# Patient Record
Sex: Female | Born: 1950 | Race: White | Hispanic: No | Marital: Married | Smoking: Former smoker
Health system: Southern US, Community
[De-identification: ages and names within clinical notes are randomized; demographics above are authoritative.]

## PROBLEM LIST (undated history)

## (undated) DIAGNOSIS — Z8673 Personal history of transient ischemic attack (TIA), and cerebral infarction without residual deficits: Secondary | ICD-10-CM

## (undated) DIAGNOSIS — F419 Anxiety disorder, unspecified: Secondary | ICD-10-CM

## (undated) DIAGNOSIS — E785 Hyperlipidemia, unspecified: Secondary | ICD-10-CM

## (undated) DIAGNOSIS — M810 Age-related osteoporosis without current pathological fracture: Secondary | ICD-10-CM

## (undated) DIAGNOSIS — N201 Calculus of ureter: Secondary | ICD-10-CM

## (undated) DIAGNOSIS — F329 Major depressive disorder, single episode, unspecified: Secondary | ICD-10-CM

## (undated) DIAGNOSIS — F0150 Vascular dementia without behavioral disturbance: Secondary | ICD-10-CM

## (undated) DIAGNOSIS — G43909 Migraine, unspecified, not intractable, without status migrainosus: Secondary | ICD-10-CM

## (undated) DIAGNOSIS — N200 Calculus of kidney: Secondary | ICD-10-CM

## (undated) DIAGNOSIS — Z8711 Personal history of peptic ulcer disease: Secondary | ICD-10-CM

## (undated) DIAGNOSIS — M1990 Unspecified osteoarthritis, unspecified site: Secondary | ICD-10-CM

## (undated) DIAGNOSIS — E119 Type 2 diabetes mellitus without complications: Secondary | ICD-10-CM

## (undated) DIAGNOSIS — N189 Chronic kidney disease, unspecified: Secondary | ICD-10-CM

## (undated) DIAGNOSIS — I10 Essential (primary) hypertension: Secondary | ICD-10-CM

## (undated) DIAGNOSIS — F32A Depression, unspecified: Secondary | ICD-10-CM

## (undated) DIAGNOSIS — Z8719 Personal history of other diseases of the digestive system: Secondary | ICD-10-CM

## (undated) DIAGNOSIS — Z86718 Personal history of other venous thrombosis and embolism: Secondary | ICD-10-CM

## (undated) DIAGNOSIS — Z87442 Personal history of urinary calculi: Secondary | ICD-10-CM

## (undated) DIAGNOSIS — K219 Gastro-esophageal reflux disease without esophagitis: Secondary | ICD-10-CM

## (undated) HISTORY — DX: Chronic kidney disease, unspecified: N18.9

## (undated) HISTORY — DX: Depression, unspecified: F32.A

## (undated) HISTORY — DX: Personal history of transient ischemic attack (TIA), and cerebral infarction without residual deficits: Z86.73

## (undated) HISTORY — DX: Major depressive disorder, single episode, unspecified: F32.9

## (undated) HISTORY — DX: Unspecified osteoarthritis, unspecified site: M19.90

## (undated) HISTORY — DX: Vascular dementia, unspecified severity, without behavioral disturbance, psychotic disturbance, mood disturbance, and anxiety: F01.50

## (undated) HISTORY — DX: Personal history of urinary calculi: Z87.442

## (undated) HISTORY — DX: Age-related osteoporosis without current pathological fracture: M81.0

## (undated) HISTORY — DX: Personal history of other venous thrombosis and embolism: Z86.718

## (undated) HISTORY — DX: Essential (primary) hypertension: I10

## (undated) HISTORY — PX: WISDOM TOOTH EXTRACTION: SHX21

## (undated) HISTORY — DX: Migraine, unspecified, not intractable, without status migrainosus: G43.909

## (undated) HISTORY — DX: Anxiety disorder, unspecified: F41.9

## (undated) HISTORY — DX: Hyperlipidemia, unspecified: E78.5

---

## 1997-07-20 ENCOUNTER — Other Ambulatory Visit: Admission: RE | Admit: 1997-07-20 | Discharge: 1997-07-20 | Payer: Self-pay | Admitting: Obstetrics and Gynecology

## 1997-08-18 ENCOUNTER — Ambulatory Visit (HOSPITAL_COMMUNITY): Admission: RE | Admit: 1997-08-18 | Discharge: 1997-08-18 | Payer: Self-pay | Admitting: Obstetrics and Gynecology

## 1997-09-02 ENCOUNTER — Ambulatory Visit (HOSPITAL_COMMUNITY): Admission: RE | Admit: 1997-09-02 | Discharge: 1997-09-02 | Payer: Self-pay | Admitting: Obstetrics and Gynecology

## 1998-12-22 ENCOUNTER — Other Ambulatory Visit: Admission: RE | Admit: 1998-12-22 | Discharge: 1998-12-22 | Payer: Self-pay | Admitting: Obstetrics and Gynecology

## 2001-05-22 HISTORY — PX: CHOLECYSTECTOMY: SHX55

## 2009-05-22 DIAGNOSIS — I10 Essential (primary) hypertension: Secondary | ICD-10-CM

## 2009-05-22 HISTORY — DX: Essential (primary) hypertension: I10

## 2010-06-12 ENCOUNTER — Encounter: Payer: Self-pay | Admitting: Family Medicine

## 2010-06-13 ENCOUNTER — Encounter: Payer: Self-pay | Admitting: Family Medicine

## 2011-07-26 ENCOUNTER — Encounter: Payer: Self-pay | Admitting: Internal Medicine

## 2011-07-26 ENCOUNTER — Ambulatory Visit (INDEPENDENT_AMBULATORY_CARE_PROVIDER_SITE_OTHER): Payer: PRIVATE HEALTH INSURANCE | Admitting: Internal Medicine

## 2011-07-26 DIAGNOSIS — I10 Essential (primary) hypertension: Secondary | ICD-10-CM | POA: Insufficient documentation

## 2011-07-26 DIAGNOSIS — E119 Type 2 diabetes mellitus without complications: Secondary | ICD-10-CM | POA: Insufficient documentation

## 2011-07-26 DIAGNOSIS — E785 Hyperlipidemia, unspecified: Secondary | ICD-10-CM | POA: Insufficient documentation

## 2011-07-26 LAB — BASIC METABOLIC PANEL
CO2: 26 mEq/L (ref 19–32)
Calcium: 9.8 mg/dL (ref 8.4–10.5)
Creatinine, Ser: 0.8 mg/dL (ref 0.4–1.2)
GFR: 82.84 mL/min (ref >60.00)
Sodium: 140 mEq/L (ref 135–145)

## 2011-07-26 LAB — AST: AST: 33 U/L (ref 0–37)

## 2011-07-26 LAB — HEMOGLOBIN A1C: Hgb A1c MFr Bld: 12.6 % — ABNORMAL HIGH (ref 4.6–6.5)

## 2011-07-26 MED ORDER — GLIMEPIRIDE 2 MG PO TABS: 2.0000 mg | ORAL_TABLET | Freq: Every day | ORAL | Status: DC

## 2011-07-26 NOTE — Patient Instructions (Addendum)
Please take the medications according to the list Be aware of low blood sugars, carry some sugar with you. Check  the sugar every morning fasting and one additional time before a meal during the day. Call if problems particularly if nausea continue Come back in a month GET ALL RECORDS FROM PREVIOUS MD

## 2011-07-26 NOTE — Progress Notes (Signed)
  Subjective:    Patient ID: Julie [name], female    DOB: 01-11-1951, 61 y.o.   MRN: [id number]  HPI New patient, here with her daughter. Since about Christmas she developed polyuria and polydipsia, she felt very tired. She went to see her family doctor on 07/07/2011, CBG was 400, was prescribed metformin 1000 mg twice a day. Since then the polyuria and polydipsia are better but every time she takes metformin she feels nauseous, having headaches and feels dizzy. She had to use some Phenergan that she had. She checks her CBGs with a glucometer, unable to tell me exactly how her sugars are but they "go up and down".   Past medical history Diabetes Dx 2-13, CBGs ~400 HTN dx 2011  Depression-anxiety Migraines   Past surgical history Cholecystectomy ~2003  Social history Married, 1 daughter Stay home mom Tobacco--quit [phone] ETOH-- rarely   Family history Diabetes-- F, bro CAD-- F dx in his 82s, B CABG at age 29, onset of CAD ? CHF-- M Stroke-- no Colon cancer--no Breast cancer-- GM  Review of Systems Reports a 20 pound weight loss gradually since Christmas. No chest shortness or breath No diarrhea or blood in the stools No dysuria or gross hematuria Vision is quite blurred since December.    Objective:   Physical Exam  Alert, oriented x3, no apparent distress. Neck without thyromegaly Lungs are clear to auscultation bilaterally Cardiovascular regular rhythm without a murmur abdomen not distended, soft, no organomegaly Extremities without edema Psych with no evidence of anxiety or depression.      Assessment & Plan:  Today , I spent more than 30  min with the patient, >50% of the time counseling

## 2011-07-26 NOTE — Assessment & Plan Note (Addendum)
New onset of diabetes diagnosed last month, she presented with classic hyperglycemic symptoms. She started metformin 1000 mg twice a day, she feels better however she is having significant side effects (GI)  from metformin. I discussed with patient what diabetes is, what to expect from the disease. We talked about diet and exercise Warned about hypoglycemic symptoms I also made her aware that she is high risk for cardiovascular events due to her own medical problems and  family history CBG today 191, on her machine 181 Plan: Get records from last month glimeperide  2 mg daily Reduce metformin to 500 mg twice a day Refer to a nutritionist Encouraged regular exercise Aspirin daily

## 2011-08-03 ENCOUNTER — Encounter: Payer: Self-pay | Admitting: *Deleted

## 2011-08-20 ENCOUNTER — Telehealth: Payer: Self-pay | Admitting: Internal Medicine

## 2011-08-20 NOTE — Telephone Encounter (Signed)
Old records from 2008 on reviewed, she was know to have hyperglycemia since 2010. Only labs available are from 07-07-11, blood sugar ~ 300, no A1Cs found. We are scanning recent-relevant records, other will be mail to pt for safekeeping

## 2011-08-29 ENCOUNTER — Encounter: Payer: Self-pay | Admitting: Internal Medicine

## 2011-08-29 ENCOUNTER — Ambulatory Visit (INDEPENDENT_AMBULATORY_CARE_PROVIDER_SITE_OTHER): Payer: PRIVATE HEALTH INSURANCE | Admitting: Internal Medicine

## 2011-08-29 DIAGNOSIS — E785 Hyperlipidemia, unspecified: Secondary | ICD-10-CM

## 2011-08-29 DIAGNOSIS — M549 Dorsalgia, unspecified: Secondary | ICD-10-CM | POA: Insufficient documentation

## 2011-08-29 DIAGNOSIS — E119 Type 2 diabetes mellitus without complications: Secondary | ICD-10-CM

## 2011-08-29 LAB — LIPID PANEL
HDL: 39.3 mg/dL (ref >39.00)
Total CHOL/HDL Ratio: 4
Triglycerides: 98 mg/dL (ref 0.0–149.0)

## 2011-08-29 LAB — AST: AST: 32 U/L (ref 0–37)

## 2011-08-29 LAB — ALT: ALT: 28 U/L (ref 0–35)

## 2011-08-29 MED ORDER — METFORMIN HCL 1000 MG PO TABS: ORAL_TABLET | ORAL | Status: DC

## 2011-08-29 NOTE — Assessment & Plan Note (Signed)
Will check labs

## 2011-08-29 NOTE — Assessment & Plan Note (Addendum)
(  Per review of records from her previous physician, her blood sugar has been elevated since 2010) At this point, her blood sugars in the morning 120, at bedtime 200s. Increase metformin from 500 mg bid to 500 mg in the morning and 1000 mg with dinner. Will call if she developed nausea again. Labs

## 2011-08-29 NOTE — Assessment & Plan Note (Signed)
One year history of back pain, mostly when she walks, some radiation to right leg. Has seen a chiropractor on and off without relief. Refer to orthopedic surgery

## 2011-08-29 NOTE — Progress Notes (Signed)
  Subjective:    Patient ID: Julie [name], female    DOB: 02-Dec-1950, 61 y.o.   MRN: [id number]  HPI Routine office visit Diabetes, good medication compliance, blood sugars 120 morning, sometimes up to 200 before dinner. Hypertension, good medication compliance. Complains of back pain for a while, chiropractor has seen her several times without much help.  Past medical history  Diabetes Dx 2-13, CBGs ~400  HTN dx 2011  Depression-anxiety  Migraines  Past surgical history  Cholecystectomy ~2003   Review of Systems The patient had GI side effects with a high dose of metformin, tolerating lower dose well. Diet and exercise have improved. Has gained 3 pounds since her last visit, patient is counseled, I don't think she needs to worry, is likely due to the fact that diabetes is under better control      Objective:   Physical Exam  General -- alert, well-developed, and well-nourished. NAD  Lungs -- normal respiratory effort, no intercostal retractions, no accessory muscle use, and normal breath sounds.   Heart-- normal rate, regular rhythm, no murmur, and no gallop.   Extremities-- no pretibial edema bilaterally      Assessment & Plan:

## 2011-08-31 LAB — HEMOGLOBIN A1C: Hgb A1c MFr Bld: 9.3 % — ABNORMAL HIGH (ref 4.6–6.5)

## 2011-09-01 ENCOUNTER — Encounter: Payer: Self-pay | Admitting: *Deleted

## 2011-10-02 ENCOUNTER — Telehealth: Payer: Self-pay | Admitting: Internal Medicine

## 2011-10-02 MED ORDER — FENOFIBRATE MICRONIZED 200 MG PO CAPS: 200.0000 mg | ORAL_CAPSULE | Freq: Every day | ORAL | Status: DC

## 2011-10-02 NOTE — Telephone Encounter (Signed)
Refill: Fenofibrate 200mg  cap #30. Take one capsule by mouth every day. Last fill 08-14-11

## 2011-10-02 NOTE — Telephone Encounter (Signed)
Refill done.  

## 2011-10-09 ENCOUNTER — Other Ambulatory Visit: Payer: Self-pay | Admitting: Sports Medicine

## 2011-10-09 DIAGNOSIS — M545 Low back pain: Secondary | ICD-10-CM

## 2011-10-09 DIAGNOSIS — M79604 Pain in right leg: Secondary | ICD-10-CM

## 2011-10-19 ENCOUNTER — Ambulatory Visit
Admission: RE | Admit: 2011-10-19 | Discharge: 2011-10-19 | Disposition: A | Discharge status: Home / Self Care | Payer: PRIVATE HEALTH INSURANCE | Source: Ambulatory Visit | Attending: Sports Medicine | Admitting: Sports Medicine

## 2011-10-19 DIAGNOSIS — M545 Low back pain: Secondary | ICD-10-CM

## 2011-10-19 DIAGNOSIS — M79604 Pain in right leg: Secondary | ICD-10-CM

## 2011-10-30 ENCOUNTER — Telehealth: Payer: Self-pay | Admitting: *Deleted

## 2011-10-30 NOTE — Telephone Encounter (Signed)
Pt states she is going for another CT scan for lumbar & she wants to know if she can also get a CT scan done of her lower abdomen. Pt states she is having sharp pains on right side of abdomen & she thinks she has a cyst on her ovary. Please advise.

## 2011-10-30 NOTE — Telephone Encounter (Signed)
Discussed with pt

## 2011-10-30 NOTE — Telephone Encounter (Signed)
If she has pain in the lower abdomen and she thinks is ovarian cyst, a ultrasound is more appropriate. Recommend to finish with the back evaluation  first, then she needs to see gynecology or RTC for a followup

## 2011-11-15 ENCOUNTER — Encounter: Payer: Self-pay | Admitting: Obstetrics and Gynecology

## 2011-11-15 ENCOUNTER — Ambulatory Visit (INDEPENDENT_AMBULATORY_CARE_PROVIDER_SITE_OTHER): Payer: PRIVATE HEALTH INSURANCE | Admitting: Obstetrics and Gynecology

## 2011-11-15 VITALS — BP 100/48 | Wt 175.0 lb

## 2011-11-15 DIAGNOSIS — R1031 Right lower quadrant pain: Secondary | ICD-10-CM

## 2011-11-15 DIAGNOSIS — Z124 Encounter for screening for malignant neoplasm of cervix: Secondary | ICD-10-CM

## 2011-11-15 DIAGNOSIS — Z780 Asymptomatic menopausal state: Secondary | ICD-10-CM

## 2011-11-15 LAB — POCT URINALYSIS DIPSTICK
Nitrite, UA: NEGATIVE
Spec Grav, UA: 1.015
Urobilinogen, UA: NEGATIVE

## 2011-11-15 NOTE — Progress Notes (Signed)
The patient is not taking hormone replacement therapy The patient  is not taking a Calcium supplement. Post-menopausal bleeding:no  Last Pap: was normal 2011 Last mammogram: was normal 2011 Last DEXA scan : Pt stated never had a DEXA  Last colonoscopy:Never had one    Urinary symptoms: none Normal bowel movements: Yes Reports abuse at home: No:  Pt stated having  lower right side pain.  Subjective:     Julie [name] is a 61 y.o. female G1P1001 who presents with complaints of C/O RLQ pain for 6 months gradually increasing. Initially was told it was referred pain from her back. Bad cramp on/off radiating to back, not improved with Tylenol. Nothing helps. No trigger. Lasts for hours.No associated symptoms. Associated symptoms: none Family history: Positive for mother with GYN cancer and MGM with Breast cancer .  Pertinent gyn history:   Menses: post-menopausal 1995.HRT for the initial 6 months.  PMH: Past Medical History  Diagnosis Date  . Depression   . Diabetes mellitus   . Migraines   . Hypertension      Medication: (Not in a hospital admission) Allergies:No Known Allergies    Review of systems:   Pertinent items are noted in HPI.  Objective:    BP 100/48  Wt 175 lb (79.379 kg)  Weight:  Wt Readings from Last 1 Encounters:  11/15/11 175 lb (79.379 kg)    BMI: There is no height on file to calculate BMI.  General Appearance: Alert, appropriate appearance for age. No acute distress HEENT: Grossly normal Neck / Thyroid: Supple, no masses, nodes or enlargement Lungs: clear to auscultation bilaterally Back: No CVA tenderness Cardiovascular: Regular rate and rhythm. S1, S2, no murmur Gastrointestinal: Soft, non-tender, no masses or organomegaly Pelvic Exam: Vulva and vagina appear normal. Bimanual exam reveals normal uterus and adnexa. Rectovaginal: normal rectal, no masses       Assessment and Plan:   Unexplained RLQ pain Pelvic sono. Also recommend DEXA  and CRC screen  Julie [name] A  MD 6/26/20134:22 PM

## 2011-11-17 LAB — PAP IG W/ RFLX HPV ASCU

## 2011-11-20 ENCOUNTER — Other Ambulatory Visit: Payer: Self-pay

## 2011-11-21 ENCOUNTER — Telehealth: Payer: Self-pay | Admitting: Obstetrics and Gynecology

## 2011-11-21 NOTE — Telephone Encounter (Signed)
sr pt 

## 2011-11-21 NOTE — Telephone Encounter (Signed)
Pt c/o pain on r side radiating around to back and straight through.  Can't sleep well and has to lie down during the day.  Nothing seems to make it better or worse.  Moving gives her the stabbing pain and resting results in the deep achy pain.  States she has been taking a lot of Tylenol and has had six already today at the time of phone call.  Pt is requesting something else for pain.  Suggested to pt that she stop Tylenol and try Ibuprofen in the meantime if not allergic or stomach sensitive.  600mg  q 6 hrs.  Also to try heat and/ or rotate cold to see if either is helpful.    Pt was new to [facility] and has u/s sch for 11-30-11.  DXA is also that day.   ld

## 2011-11-21 NOTE — Telephone Encounter (Signed)
Laura/general quest.

## 2011-11-26 ENCOUNTER — Other Ambulatory Visit: Payer: Self-pay | Admitting: Internal Medicine

## 2011-11-27 NOTE — Telephone Encounter (Signed)
Refill done.  

## 2011-11-29 ENCOUNTER — Ambulatory Visit: Payer: PRIVATE HEALTH INSURANCE | Admitting: Internal Medicine

## 2011-11-29 ENCOUNTER — Other Ambulatory Visit: Payer: Self-pay

## 2011-11-29 DIAGNOSIS — R1031 Right lower quadrant pain: Secondary | ICD-10-CM

## 2011-11-30 ENCOUNTER — Ambulatory Visit (INDEPENDENT_AMBULATORY_CARE_PROVIDER_SITE_OTHER): Payer: PRIVATE HEALTH INSURANCE | Admitting: Obstetrics and Gynecology

## 2011-11-30 ENCOUNTER — Encounter: Payer: Self-pay | Admitting: Obstetrics and Gynecology

## 2011-11-30 ENCOUNTER — Ambulatory Visit (INDEPENDENT_AMBULATORY_CARE_PROVIDER_SITE_OTHER): Payer: PRIVATE HEALTH INSURANCE

## 2011-11-30 ENCOUNTER — Other Ambulatory Visit: Payer: Self-pay | Admitting: Obstetrics and Gynecology

## 2011-11-30 VITALS — BP 120/76 | Wt 174.0 lb

## 2011-11-30 DIAGNOSIS — R1031 Right lower quadrant pain: Secondary | ICD-10-CM

## 2011-11-30 DIAGNOSIS — M810 Age-related osteoporosis without current pathological fracture: Secondary | ICD-10-CM

## 2011-11-30 DIAGNOSIS — Z780 Asymptomatic menopausal state: Secondary | ICD-10-CM

## 2011-11-30 MED ORDER — TRAMADOL HCL 50 MG PO TABS: 50.0000 mg | ORAL_TABLET | Freq: Four times a day (QID) | ORAL | Status: AC | PRN

## 2011-11-30 MED ORDER — IBANDRONATE SODIUM 150 MG PO TABS: 150.0000 mg | ORAL_TABLET | ORAL | Status: DC

## 2011-11-30 NOTE — Progress Notes (Signed)
Subjective:  Pt is here for a follow up u/s  For RLQ pain and a Dexascan today.   Objective:  Sono:  Anteverted uterus, normal endometrium, normal ovaries, no fluid in CDS, normal adnexas.  DXA: Spine    T score -2.5 Femoral neck   T score -2.8 Total hip   T score -2.2  Assessment:  1. Unexplained RLQ pain with normal ultrasound 2. Osteoporosis  Plan:  1. Ultram 50 mg, referral to Dr Loreta Ave 2. Options reviewed. Vitamin D level today. Calcium and Vit D needs reviewed. Pt desires to start Sandrea Hammond  Return 1 year

## 2011-12-01 ENCOUNTER — Telehealth: Payer: Self-pay

## 2011-12-01 LAB — VITAMIN D 25 HYDROXY (VIT D DEFICIENCY, FRACTURES): Vit D, 25-Hydroxy: 20 ng/mL — ABNORMAL LOW (ref 30–89)

## 2011-12-01 MED ORDER — VITAMIN D3 1.25 MG (50000 UT) PO CAPS: 1.0000 | ORAL_CAPSULE | ORAL | Status: DC

## 2011-12-01 NOTE — Telephone Encounter (Signed)
Message copied by Larwance Rote on Fri Dec 01, 2011  2:48 PM ------      Message from: Silverio Lay      Created: Fri Dec 01, 2011  1:59 PM       Please inform patient that Vitamin D is low. Initiate Vitamin D protocol.

## 2011-12-01 NOTE — Telephone Encounter (Signed)
LM for patient that Vit D is low.  Rx sent in to pharmacy. Pt to call with questions or concerns.  ld

## 2011-12-01 NOTE — Progress Notes (Signed)
Quick Note:  Please inform patient that Vitamin D is low. Initiate Vitamin D protocol. ______ 

## 2011-12-07 ENCOUNTER — Telehealth: Payer: Self-pay | Admitting: Obstetrics and Gynecology

## 2011-12-07 ENCOUNTER — Ambulatory Visit (INDEPENDENT_AMBULATORY_CARE_PROVIDER_SITE_OTHER): Payer: PRIVATE HEALTH INSURANCE | Admitting: Internal Medicine

## 2011-12-07 VITALS — BP 122/78 | HR 70 | Temp 97.9°F | Wt 176.0 lb

## 2011-12-07 DIAGNOSIS — M549 Dorsalgia, unspecified: Secondary | ICD-10-CM

## 2011-12-07 DIAGNOSIS — R1031 Right lower quadrant pain: Secondary | ICD-10-CM

## 2011-12-07 DIAGNOSIS — M810 Age-related osteoporosis without current pathological fracture: Secondary | ICD-10-CM

## 2011-12-07 DIAGNOSIS — E119 Type 2 diabetes mellitus without complications: Secondary | ICD-10-CM

## 2011-12-07 DIAGNOSIS — I10 Essential (primary) hypertension: Secondary | ICD-10-CM

## 2011-12-07 LAB — BASIC METABOLIC PANEL
CO2: 27 mEq/L (ref 19–32)
Calcium: 9.5 mg/dL (ref 8.4–10.5)
Creatinine, Ser: 0.8 mg/dL (ref 0.4–1.2)

## 2011-12-07 LAB — ALT: ALT: 24 U/L (ref 0–35)

## 2011-12-07 NOTE — Telephone Encounter (Signed)
sr pt 

## 2011-12-07 NOTE — Assessment & Plan Note (Signed)
Well-controlled, no change 

## 2011-12-07 NOTE — Telephone Encounter (Signed)
LAURA/RX °

## 2011-12-07 NOTE — Progress Notes (Signed)
  Subjective:    Patient ID: Julie [name], female    DOB: 28-Jul-1950, 61 y.o.   MRN: [id number]  HPI Routine office visit Diabetes, good medication compliance. Ambulatory blood sugars has dropped to 150, 140.  Two times , she felt shaky and blood sugars were low (readings?) RLQ abdominal pain, this is going on for a while, goes straight from the abdomen into the back, on and off, does not change with urination or bowel movements. Related to physical activity?. Recently saw gynecology, checkup was completely normal, was told this is not a gynecological issue. Continue with back pain which occasionally right-sided radiation, saw orthopedic surgery, MRI was done (see assessment and plan), was told she has DJD, no specific treatment and prescribed, possible injection in the future.  Past medical history   Diabetes Dx 2-13, CBGs ~400   HTN dx 2011   Depression-anxiety   Migraines   DJD-- back pain Osteoporosis Past surgical history   Cholecystectomy ~2003   Review of Systems No dysuria or gross hematuria No nausea, vomiting, diarrhea or blood in the stools.    Objective:   Physical Exam General -- alert, well-developed, and overweight appearing. No apparent distress.  Abdomen--soft, non-tender, no distention, no masses, no HSM, no guarding, and no rigidity.   No inguinal hernias Extremities-- no pretibial edema bilaterally  Neurologic-- alert & oriented X3 and strength normal in all extremities. Psych-- Cognition and judgment appear intact. Alert and cooperative with normal attention span and concentration.  not anxious appearing and not depressed appearing.      Assessment & Plan:

## 2011-12-07 NOTE — Assessment & Plan Note (Signed)
Right lower quadrant abdominal pain going on for about a year, recent female examination normal. The pain has some mechanical features, could be related to DJD of the spine. We'll reassess in 3 or 4 months. She never had a colonoscopy

## 2011-12-07 NOTE — Assessment & Plan Note (Signed)
Status post evaluation by orthopedic surgery, office visit note not available however in the MRI report is available: Some DJD changes, some spinal stenosis and bone lesions L1 and S1. CT imaging recommended for further  Delineation.  Plan: We'll discuss MRI with radiology. Consider CT of the theabdomen and spine to evaluate both the abnormal MRI and chronic right lower quadrant abdominal pain.

## 2011-12-07 NOTE — Assessment & Plan Note (Signed)
Good compliance with medicines, has improved her diet. Labs

## 2011-12-08 NOTE — Telephone Encounter (Signed)
Pt notified that Sandrea Hammond was called in this time to make sure that it was r/c.  Daughter Marchelle Folks wanted to know about her surgery being scheduled.  Notified her that she should be r/c a call from [place] to sch with Dr Marice Potter per SR.   Both pts agreeable.  ld

## 2011-12-11 ENCOUNTER — Telehealth: Payer: Self-pay | Admitting: Internal Medicine

## 2011-12-11 DIAGNOSIS — R9389 Abnormal findings on diagnostic imaging of other specified body structures: Secondary | ICD-10-CM

## 2011-12-11 DIAGNOSIS — R109 Unspecified abdominal pain: Secondary | ICD-10-CM

## 2011-12-11 NOTE — Telephone Encounter (Signed)
I do not see any orders for a CT. Does she need to have one? Please advise.

## 2011-12-11 NOTE — Telephone Encounter (Signed)
Left detailed msg on vmail.  

## 2011-12-11 NOTE — Telephone Encounter (Signed)
Pt states she is supposed to go have a CT Scan, but has not heard back from [place]. Pt is requesting a call back.

## 2011-12-11 NOTE — Telephone Encounter (Signed)
Tell pt I'll discuss w/ radiology her XRs this week, will call her

## 2011-12-12 NOTE — Telephone Encounter (Signed)
Spoke with radiology they recommend---> CT of the abdomen and pelvis with contrast, attention to the spine lesions. Please notify patient we are working on her CT

## 2011-12-12 NOTE — Addendum Note (Signed)
Addended by: Willow Ora E on: 12/12/2011 10:23 AM   Modules accepted: Orders

## 2011-12-12 NOTE — Telephone Encounter (Signed)
Notified pt. 

## 2011-12-13 ENCOUNTER — Encounter: Payer: Self-pay | Admitting: Internal Medicine

## 2011-12-18 ENCOUNTER — Ambulatory Visit
Admission: RE | Admit: 2011-12-18 | Discharge: 2011-12-18 | Disposition: A | Discharge status: Home / Self Care | Payer: PRIVATE HEALTH INSURANCE | Source: Ambulatory Visit | Attending: Internal Medicine | Admitting: Internal Medicine

## 2011-12-18 DIAGNOSIS — R9389 Abnormal findings on diagnostic imaging of other specified body structures: Secondary | ICD-10-CM

## 2011-12-18 DIAGNOSIS — R109 Unspecified abdominal pain: Secondary | ICD-10-CM

## 2011-12-18 MED ORDER — IOHEXOL 300 MG/ML  SOLN
100.0000 mL | Freq: Once | INTRAMUSCULAR | Status: AC | PRN
  Administered 2011-12-18: 100 mL via INTRAVENOUS

## 2011-12-19 ENCOUNTER — Telehealth: Payer: Self-pay | Admitting: Internal Medicine

## 2011-12-19 DIAGNOSIS — R1030 Lower abdominal pain, unspecified: Secondary | ICD-10-CM

## 2011-12-19 NOTE — Telephone Encounter (Signed)
Discussed with pt & entered referral to GI. Pt also stated when she had her CT done on Monday they advised her to hold her metformin until Wednesday. She just wanted to double check with you to make sure that was ok.

## 2011-12-19 NOTE — Telephone Encounter (Signed)
1. CT of the abdomen without worrisome symptoms, patient wonders what is the next step. If she continued to have lower abdominal pain, I would recommend a GI referral. If she continue with back pain, needs to discuss with orthopedic surgery. 2. The patient sent me her CBGs readings, after we decrease Amaryl. CBGs ranged from 91-120 (one time was 291). Advise patient, these are good readings. No change

## 2011-12-19 NOTE — Telephone Encounter (Signed)
That is ok 

## 2011-12-21 ENCOUNTER — Telehealth: Payer: Self-pay

## 2011-12-21 NOTE — Telephone Encounter (Signed)
Pt requesting a refill of "pain pills".  Pt was given Ultram on 11-30-11.  Pt had CT done and is sch with Dr Loreta Ave on 01-15-12 for eval.  Only has one pill left and states that Tylenol makes her stomach hurt after a while.  Will route to EP for review.  Pt agreeable.  ld

## 2011-12-25 ENCOUNTER — Other Ambulatory Visit: Payer: Self-pay

## 2011-12-25 MED ORDER — TRAMADOL HCL 50 MG PO TABS
50.0000 mg | ORAL_TABLET | Freq: Four times a day (QID) | ORAL | Status: AC | PRN
Start: 2011-12-25 — End: 2012-01-04

## 2011-12-25 NOTE — Telephone Encounter (Signed)
LM for pt that ultram was sent to pharmacy. Ok per EP.  ld

## 2012-01-15 ENCOUNTER — Ambulatory Visit (INDEPENDENT_AMBULATORY_CARE_PROVIDER_SITE_OTHER): Payer: PRIVATE HEALTH INSURANCE | Admitting: Internal Medicine

## 2012-01-15 ENCOUNTER — Encounter: Payer: Self-pay | Admitting: Internal Medicine

## 2012-01-15 VITALS — BP 110/70 | HR 80 | Ht 66.0 in | Wt 174.4 lb

## 2012-01-15 DIAGNOSIS — M48061 Spinal stenosis, lumbar region without neurogenic claudication: Secondary | ICD-10-CM

## 2012-01-15 DIAGNOSIS — M4807 Spinal stenosis, lumbosacral region: Secondary | ICD-10-CM

## 2012-01-15 DIAGNOSIS — R1031 Right lower quadrant pain: Secondary | ICD-10-CM

## 2012-01-15 DIAGNOSIS — Z1211 Encounter for screening for malignant neoplasm of colon: Secondary | ICD-10-CM

## 2012-01-15 NOTE — Patient Instructions (Addendum)
Please read over the information on colonoscopies, colon cancer, and colon polyps and call us back after you have decided if you want to have a colonoscopy.

## 2012-01-15 NOTE — Progress Notes (Signed)
HISTORY OF PRESENT ILLNESS:  Julie [name] is a 61 y.o. female with hypertension, diabetes mellitus, history of kidney stones, and migraine headaches. She is sent today regarding right-sided abdominal/flank/back pain and the need for colonoscopy. The patient denies a prior history of GI problems or GI procedures. She reports an 8 month history of right lower quadrant pain. She describes pain that either originates in the abdomen and radiates to the right flank and back or his noticed exclusively in the back on the right side. The discomfort does not occur every day. She cannot identify factors which either bring on, exacerbate, or make better the discomfort. Specifically, she does not notice changes with activity, body position, movement, meals, defecation, or urination. Her appetite has been good and body weight stable. Her GI review of systems is only remarkable for occasional heartburn for which she takes when necessary antacids or H2 receptor antagonists with relief. No dysphagia. She has not had prior screening colonoscopy. She states she is apprehensive about proceeding. In terms of workup, she did undergo an MRI of the spine in May 2013. This revealed significant degenerative changes in the lumbar sacral region. He was noted to have spinal stenosis as well as compression of the L5 nerve root, right greater than left. In July she underwent pelvic ultrasound which she reports was negative. Also, CT scan of the abdomen and pelvis performed 12-12-11 revealed no intra-abdominal pathology to explain pain. She did have bilateral nonobstructing kidney stones as well as the aforementioned lumbar sacral degenerative disc disease. There is no family history of colon cancer.  REVIEW OF SYSTEMS:  All non-GI ROS negative except for data, back pain, visual change, depression.  Past Medical History  Diagnosis Date  . Depression   . Diabetes mellitus   . Migraines   . Hypertension   . History of kidney stones     . Osteoporosis     Past Surgical History  Procedure Date  . Cholecystectomy 2003  . Wisdom tooth extraction     Social History Julie [name]  reports that she quit smoking about 32 years ago. Her smoking use included Cigarettes. She has never used smokeless tobacco. She reports that she drinks alcohol. She reports that she does not use illicit drugs.  family history includes Breast cancer in her maternal grandmother and paternal grandmother; Cancer in her mother; Diabetes in her father; Heart disease in her father and mother; Hyperlipidemia in her father and mother; Hypertension in her father, maternal grandfather, maternal grandmother, mother, paternal grandfather, and paternal grandmother; Mental illness in her maternal grandfather, maternal grandmother, paternal grandfather, and paternal grandmother; and Stomach cancer in her maternal grandfather.  No Known Allergies     PHYSICAL EXAMINATION: Vital signs: BP 110/70  Pulse 80  Ht 5\' 6"  (1.676 m)  Wt 174 lb 6 oz (79.096 kg)  BMI 28.14 kg/m2  Constitutional: generally well-appearing, no acute distress Psychiatric: alert and oriented x3, cooperative Eyes: extraocular movements intact, anicteric, conjunctiva pink Mouth: oral pharynx moist, no lesions Neck: supple no lymphadenopathy Cardiovascular: heart regular rate and rhythm, no murmur Lungs: clear to auscultation bilaterally Abdomen: soft, nontender, nondistended, no obvious ascites, no peritoneal signs, normal bowel sounds, no organomegaly Rectal:not performed Extremities: no lower extremity edema bilaterally Skin: no lesions on visible extremities Neuro: No focal deficits.   ASSESSMENT:  #1. Right lower quadrant/flank/back pain x8 months duration. This is most likely musculoskeletal and originates from her back with referred pain. She has seen a back specialist, and should  return to provider for further assessment and treatment. #2. Screening colonoscopy. We discussed  this in detail (20 minutes).The nature of the procedure, as well as the risks, benefits, and alternatives were carefully and thoroughly reviewed with the patient. Ample time for discussion and questions allowed. The patient understood, was satisfied, but was not willing to proceed. She was provided with supplemental literature and brochures on colonoscopy as well as colon polyps and colon cancer screening. If she decides to proceed with screening colonoscopy, she has been instructed to contact the office. She can meet with one of our previsit nurses to schedule and review instructions. Otherwise, she will return to the care of Dr. Drue Novel

## 2012-02-06 ENCOUNTER — Telehealth: Payer: Self-pay | Admitting: Internal Medicine

## 2012-02-06 NOTE — Telephone Encounter (Signed)
Ok to refill xanax 0.5mg ?

## 2012-02-06 NOTE — Telephone Encounter (Signed)
Refills x 2 last ov 7.18.13 81-month f/u   1-Diltiazem ER (CD) 240/24CAP  Take 1-capsule by mouth daily last fill 8.13.13 #30  2-XANAX 0.5 MG Take one tablet by mouth three times daily #90 last fill 7.7.13

## 2012-02-06 NOTE — Telephone Encounter (Signed)
cardiazem #30, 6 Rf Xanax #30, 2 RF

## 2012-02-07 MED ORDER — DILTIAZEM HCL ER COATED BEADS 240 MG PO CP24: 240.0000 mg | ORAL_CAPSULE | Freq: Every day | ORAL | Status: DC

## 2012-02-07 MED ORDER — ALPRAZOLAM 0.25 MG PO TABS: 0.5000 mg | ORAL_TABLET | Freq: Every evening | ORAL | Status: DC | PRN

## 2012-02-07 NOTE — Telephone Encounter (Signed)
Pt aware and meds sent in  

## 2012-03-01 ENCOUNTER — Telehealth: Payer: Self-pay | Admitting: Obstetrics and Gynecology

## 2012-03-01 NOTE — Telephone Encounter (Signed)
sr pt 

## 2012-03-01 NOTE — Telephone Encounter (Signed)
This pt calling regarding her daughter as a patient.  Daughter chart pulled up instead.  ld

## 2012-03-06 ENCOUNTER — Other Ambulatory Visit: Payer: Self-pay | Admitting: Internal Medicine

## 2012-03-06 MED ORDER — LISINOPRIL 10 MG PO TABS: 10.0000 mg | ORAL_TABLET | Freq: Every day | ORAL | Status: DC

## 2012-03-06 NOTE — Telephone Encounter (Signed)
Refill done.  

## 2012-03-06 NOTE — Telephone Encounter (Signed)
Refill Lisinopril 10mg  tab #30 Takeone tablet by mouth every day last fill 9.2.13 last ov 7.18.13-72month f/u

## 2012-04-06 ENCOUNTER — Other Ambulatory Visit: Payer: Self-pay | Admitting: Internal Medicine

## 2012-04-08 NOTE — Telephone Encounter (Signed)
Ok to refill 

## 2012-04-08 NOTE — Telephone Encounter (Signed)
Tried calling pt, unable to leave a msg. Pt does have an upcoming appt 11.25.13.

## 2012-04-08 NOTE — Telephone Encounter (Signed)
Got 30 and 2 refills 02/06/2012, she has enough until next month. Also, due for a regular checkup

## 2012-04-15 ENCOUNTER — Encounter: Payer: Self-pay | Admitting: Internal Medicine

## 2012-04-15 ENCOUNTER — Ambulatory Visit (INDEPENDENT_AMBULATORY_CARE_PROVIDER_SITE_OTHER): Payer: Self-pay | Admitting: Internal Medicine

## 2012-04-15 VITALS — BP 140/82 | HR 80 | Temp 98.0°F | Wt 177.0 lb

## 2012-04-15 DIAGNOSIS — F329 Major depressive disorder, single episode, unspecified: Secondary | ICD-10-CM

## 2012-04-15 DIAGNOSIS — F419 Anxiety disorder, unspecified: Secondary | ICD-10-CM | POA: Insufficient documentation

## 2012-04-15 DIAGNOSIS — M549 Dorsalgia, unspecified: Secondary | ICD-10-CM

## 2012-04-15 DIAGNOSIS — F32A Depression, unspecified: Secondary | ICD-10-CM

## 2012-04-15 DIAGNOSIS — I10 Essential (primary) hypertension: Secondary | ICD-10-CM

## 2012-04-15 DIAGNOSIS — E119 Type 2 diabetes mellitus without complications: Secondary | ICD-10-CM

## 2012-04-15 DIAGNOSIS — F341 Dysthymic disorder: Secondary | ICD-10-CM

## 2012-04-15 DIAGNOSIS — R1031 Right lower quadrant pain: Secondary | ICD-10-CM

## 2012-04-15 DIAGNOSIS — Z23 Encounter for immunization: Secondary | ICD-10-CM

## 2012-04-15 LAB — CBC WITH DIFFERENTIAL/PLATELET
Basophils Relative: 0.6 % (ref 0.0–3.0)
Eosinophils Relative: 3.9 % (ref 0.0–5.0)
Lymphocytes Relative: 28.7 % (ref 12.0–46.0)
MCV: 84.5 fl (ref 78.0–100.0)
Monocytes Absolute: 0.5 10*3/uL (ref 0.1–1.0)
Neutrophils Relative %: 58.1 % (ref 43.0–77.0)
Platelets: 252 10*3/uL (ref 150.0–400.0)
RBC: 4.1 Mil/uL (ref 3.87–5.11)
WBC: 6.2 10*3/uL (ref 4.5–10.5)

## 2012-04-15 LAB — HEMOGLOBIN A1C: Hgb A1c MFr Bld: 6.3 % (ref 4.6–6.5)

## 2012-04-15 MED ORDER — ALPRAZOLAM 0.25 MG PO TABS: 0.5000 mg | ORAL_TABLET | Freq: Every evening | ORAL | Status: DC | PRN

## 2012-04-15 MED ORDER — PAROXETINE HCL 40 MG PO TABS: 40.0000 mg | ORAL_TABLET | ORAL | Status: DC

## 2012-04-15 NOTE — Assessment & Plan Note (Signed)
See RLQ

## 2012-04-15 NOTE — Assessment & Plan Note (Addendum)
See history of present illness, unable to eat properly or take her medications regularly due to a new part time job. Encouraged to do her best to follow a healthy diet and take her medication on time. Labs

## 2012-04-15 NOTE — Assessment & Plan Note (Signed)
No change, check a BMP 

## 2012-04-15 NOTE — Assessment & Plan Note (Signed)
Pain is still there but not severe. MRI show bone lesions on the L1 and S 1, followup CT showed DJD. Saw GI, pain thought not to be from a GI source, they recommended a colonoscopy, she does not like to have that right now. Has not seen ortho again, they felt like the right lower quadrant pain was coming from the back.

## 2012-04-15 NOTE — Progress Notes (Signed)
  Subjective:    Patient ID: Julie [name], female    DOB: 06/29/1950, 61 y.o.   MRN: [id number]  HPI Routine office visit Diabetes-- since she started a part-time job, has been unable to eat or take her medications on time  , CBGs range from 50 to 300s. Hypertension, good medication compliance, ambulatory BPs around 140s/80s Right lower quadrant pain, is feeling better, see assessment and plan Anxiety,  needs a refill. Also complained of insomnia.   Past medical history  Diabetes Dx 2-13, CBGs ~400  HTN dx 2011  Depression-anxiety  Migraines  DJD-- back pain  Osteoporosis   Past surgical history  Cholecystectomy ~2003    History   Social History  . Marital Status: Married    Spouse Name: N/A    Number of Children: 1  . Years of Education: N/A   Occupational History  . used to be self employed, now has a Part time job    Social History Main Topics  . Smoking status: Former Smoker    Types: Cigarettes    Quit date: 05/23/1979  . Smokeless tobacco: Never Used  . Alcohol Use: Yes     Comment: ocass  . Drug Use: No  . Sexually Active: No   Other Topics Concern  . Not on file   Social History Narrative   Drink caffeinated beverages-yes# of hours of sleep per night-9-10 hoursLast eye exam-3.2.1013Last foot exam-2.2013Last PAP-2012     Review of Systems No chest pain or shortness of breath No nausea, vomiting, diarrhea or blood in the stools.     Objective:   Physical Exam General -- alert, well-developed, and overweight appearing. No apparent distress.   Lungs -- normal respiratory effort, no intercostal retractions, no accessory muscle use, and normal breath sounds.   Heart-- normal rate, regular rhythm, no murmur, and no gallop.   Extremities-- no pretibial edema bilaterally  Neurologic-- alert & oriented X3 and strength normal in all extremities. Psych-- Cognition and judgment appear intact. Alert and cooperative with normal attention span and  concentration.  slt  anxious appearing and not depressed appearing.       Assessment & Plan:

## 2012-04-15 NOTE — Assessment & Plan Note (Addendum)
Refill Paxil Also reports some difficulty with insomnia---> RF xanax

## 2012-05-03 ENCOUNTER — Telehealth: Payer: Self-pay

## 2012-05-03 NOTE — Telephone Encounter (Signed)
Left message on voicemail for patient to return call when available. (Copy of labs mailed to patient with handwritten note to call for lab appointment, if she receives copy of labs prior to returning phone call)

## 2012-05-03 NOTE — Telephone Encounter (Signed)
Message copied by Maurice Small on Fri May 03, 2012  4:39 PM ------      Message from: Wanda Plump      Created: Tue Apr 23, 2012  9:20 PM       Advise patient:      Diabetes under excellent control      Has mild anemia, please arrange--- iron-ferritin dx anemia

## 2012-05-16 ENCOUNTER — Telehealth: Payer: Self-pay | Admitting: *Deleted

## 2012-05-16 NOTE — Telephone Encounter (Signed)
Pt left VM that she was returning a call from 2 weeks ago. Left message to call office.    Message from: PAZNolon Rod  Created: Tue Apr 23, 2012 9:20 PM  Advise patient:  Diabetes under excellent control  Has mild anemia, please arrange--- iron-ferritin dx anemia

## 2012-05-29 ENCOUNTER — Encounter: Payer: Self-pay | Admitting: *Deleted

## 2012-05-29 NOTE — Telephone Encounter (Signed)
Left message to call office. Letter Mail  

## 2012-07-15 ENCOUNTER — Other Ambulatory Visit: Payer: Self-pay | Admitting: Internal Medicine

## 2012-07-15 ENCOUNTER — Encounter: Payer: Self-pay | Admitting: Lab

## 2012-07-16 ENCOUNTER — Encounter: Payer: Self-pay | Admitting: Internal Medicine

## 2012-07-16 ENCOUNTER — Ambulatory Visit (INDEPENDENT_AMBULATORY_CARE_PROVIDER_SITE_OTHER): Payer: Self-pay | Admitting: Internal Medicine

## 2012-07-16 VITALS — BP 128/78 | HR 80 | Temp 97.9°F | Wt 172.0 lb

## 2012-07-16 DIAGNOSIS — E119 Type 2 diabetes mellitus without complications: Secondary | ICD-10-CM

## 2012-07-16 DIAGNOSIS — D649 Anemia, unspecified: Secondary | ICD-10-CM

## 2012-07-16 DIAGNOSIS — R1031 Right lower quadrant pain: Secondary | ICD-10-CM

## 2012-07-16 DIAGNOSIS — E785 Hyperlipidemia, unspecified: Secondary | ICD-10-CM

## 2012-07-16 DIAGNOSIS — I10 Essential (primary) hypertension: Secondary | ICD-10-CM

## 2012-07-16 DIAGNOSIS — M810 Age-related osteoporosis without current pathological fracture: Secondary | ICD-10-CM

## 2012-07-16 LAB — LIPID PANEL
HDL: 23.2 mg/dL — ABNORMAL LOW (ref >39.00)
LDL Cholesterol: 90 mg/dL (ref 0–99)
Total CHOL/HDL Ratio: 6
Triglycerides: 123 mg/dL (ref 0.0–149.0)
VLDL: 24.6 mg/dL (ref 0.0–40.0)

## 2012-07-16 LAB — BASIC METABOLIC PANEL
CO2: 26 mEq/L (ref 19–32)
Chloride: 110 mEq/L (ref 96–112)
GFR: 90.79 mL/min (ref >60.00)
Glucose, Bld: 75 mg/dL (ref 70–99)
Potassium: 3.1 mEq/L — ABNORMAL LOW (ref 3.5–5.1)
Sodium: 144 mEq/L (ref 135–145)

## 2012-07-16 LAB — GLUCOSE, POCT (MANUAL RESULT ENTRY): POC Glucose: 84 mg/dl (ref 70–99)

## 2012-07-16 MED ORDER — LISINOPRIL 10 MG PO TABS: 10.0000 mg | ORAL_TABLET | Freq: Every day | ORAL | Status: DC

## 2012-07-16 MED ORDER — GLIMEPIRIDE 2 MG PO TABS: ORAL_TABLET | ORAL | Status: DC

## 2012-07-16 MED ORDER — ALPRAZOLAM 0.25 MG PO TABS: 0.5000 mg | ORAL_TABLET | Freq: Every evening | ORAL | Status: DC | PRN

## 2012-07-16 MED ORDER — METFORMIN HCL 1000 MG PO TABS: ORAL_TABLET | ORAL | Status: DC

## 2012-07-16 NOTE — Assessment & Plan Note (Signed)
Not taking boniva, f/u by gyn, rec to discuss w/ him/her

## 2012-07-16 NOTE — Assessment & Plan Note (Addendum)
Mild anemia noted , see last Hg: labs. Of notice, she never had a colonoscopy, she is thinking about it. I encouraged her to proceed particularly if she has iron deficiency anemia

## 2012-07-16 NOTE — Patient Instructions (Signed)
If you take Motrin , always take it with food. Watch for stomach side effects (gastritis): nausea, stomach pain, change in the color of stools. Please see your eye doctor, discuss with him/er the blurred vision, they need to check your eye for diabetes.

## 2012-07-16 NOTE — Assessment & Plan Note (Signed)
Good medication compliance, check A1c. Complains of blurred vision, doubt related to hyperglycemia, recommend to see the eye doctor, apparently had no recent screening for retinopathy.

## 2012-07-16 NOTE — Assessment & Plan Note (Signed)
Check a FLP

## 2012-07-16 NOTE — Assessment & Plan Note (Signed)
Ongoing issue, felt to be due to to DJD in the back. Taking Motrin, GI precautions discussed

## 2012-07-16 NOTE — Assessment & Plan Note (Signed)
Well-controlled,  check a BMP 

## 2012-07-16 NOTE — Progress Notes (Signed)
  Subjective:    Patient ID: Julie [name], female    DOB: 1951-04-17, 62 y.o.   MRN: [id number]  HPI Routine office visit Diabetes, good medication compliance, ambulatory blood sugars around 160. Had some low blood sugars before, episodes are less frequent at this point. Diet has improved. Right lower quadrant pain, about the same, takes Motrin sometimes. Was recently found to have mild anemia, denies any previous diagnosis of anemia. Hypertension, good medication compliance, needs refills.   Past Medical History  Diagnosis Date  . Anxiety and depression   . Diabetes mellitus 06-2011    initial  CBGs ~400    . Migraines   . Hypertension 2011  . History of kidney stones   . Osteoporosis   . DJD (degenerative joint disease)    Past Surgical History  Procedure Laterality Date  . Cholecystectomy  2003  . Wisdom tooth extraction     Review of Systems Denies nausea, vomiting, diarrhea or blood in the stools. No dysuria, gross hematuria or vaginal discharge. She has been on her menopause x a while. Blurred vision for the last 3-4 months, "like the glasses Rx prescription  is wrong". She got a new set of glasses 6 months ago.     Objective:   Physical Exam General -- alert, well-developed, BMI 27.   Lungs -- normal respiratory effort, no intercostal retractions, no accessory muscle use, and normal breath sounds.   Heart-- normal rate, regular rhythm, no murmur, and no gallop.   Abdomen--soft, non-tender, no distention, no masses, no HSM, no guarding, and no rigidity.   Extremities-- no pretibial edema bilaterally Neurologic-- alert & oriented X3 and strength normal in all extremities. Psych-- Cognition and judgment appear intact. Alert and cooperative with normal attention span and concentration.  not anxious appearing and not depressed appearing.       Assessment & Plan:

## 2012-07-19 ENCOUNTER — Other Ambulatory Visit: Payer: Self-pay | Admitting: Internal Medicine

## 2012-07-19 ENCOUNTER — Encounter: Payer: Self-pay | Admitting: *Deleted

## 2012-07-22 NOTE — Telephone Encounter (Signed)
Spoke to christina at pharmacy & pt still has refills left on file. Refill request sent in error.

## 2012-08-27 ENCOUNTER — Encounter: Payer: Self-pay | Admitting: Internal Medicine

## 2012-10-02 ENCOUNTER — Telehealth: Payer: Self-pay | Admitting: General Practice

## 2012-10-02 MED ORDER — FENOFIBRATE MICRONIZED 200 MG PO CAPS: 200.0000 mg | ORAL_CAPSULE | Freq: Every day | ORAL | Status: DC

## 2012-10-02 MED ORDER — ALPRAZOLAM 0.25 MG PO TABS: 0.5000 mg | ORAL_TABLET | Freq: Every evening | ORAL | Status: DC | PRN

## 2012-10-02 MED ORDER — DILTIAZEM HCL ER COATED BEADS 240 MG PO CP24: 240.0000 mg | ORAL_CAPSULE | Freq: Every day | ORAL | Status: DC

## 2012-10-02 NOTE — Telephone Encounter (Signed)
Pt called stating that her husband has a stroke in [place]. Pt is there with him now. Would like to know if we can [facility] [place], [place]. (if not could we fill the med to [facility] local [facility] and they can transfer the med). Pt last seen on 07/16/12. Med last filled on 07/16/12 #30 with 3 refills. Pt expects to be home the end of this month. Please advise.

## 2012-10-02 NOTE — Telephone Encounter (Signed)
Discuss with patient, Rx sent. 

## 2012-10-02 NOTE — Telephone Encounter (Signed)
Ok call 1 month supply to local [facility]

## 2012-11-12 ENCOUNTER — Ambulatory Visit: Payer: No Typology Code available for payment source | Admitting: Internal Medicine

## 2012-11-12 DIAGNOSIS — Z0289 Encounter for other administrative examinations: Secondary | ICD-10-CM

## 2012-11-13 ENCOUNTER — Ambulatory Visit: Payer: No Typology Code available for payment source | Admitting: Internal Medicine

## 2013-01-14 ENCOUNTER — Other Ambulatory Visit: Payer: Self-pay | Admitting: Internal Medicine

## 2013-01-14 NOTE — Telephone Encounter (Signed)
Refill request for Xanax Last filled by MD on - 10/02/12 Last Seen- 07/16/12 Next Appt: none Please advise refill?

## 2013-03-01 IMAGING — CT CT ABD-PELV W/ CM
3 of 7 series · 12 of 46 positions shown, 17 images · IV contrast (omnipaque)
Comparison: CT abdomen pelvis of 02/12/2009

CLINICAL DATA: Chronic right lower quadrant pain for 8 months,
history kidney stone

CT ABDOMEN AND PELVIS WITH CONTRAST
TECHNIQUE: Multidetector CT imaging of the abdomen and pelvis was
performed following the standard protocol during bolus
administration of intravenous contrast.
Contrast: 100mL OMNIPAQUE IOHEXOL 300 MG/ML  SOLN

[Series 2: abd/pelvis with · axial · 0.74mm/px · z∈[-368,-33]mm · 8 of 85 slices shown, 13 images]
[im 10/85  soft-tissue]
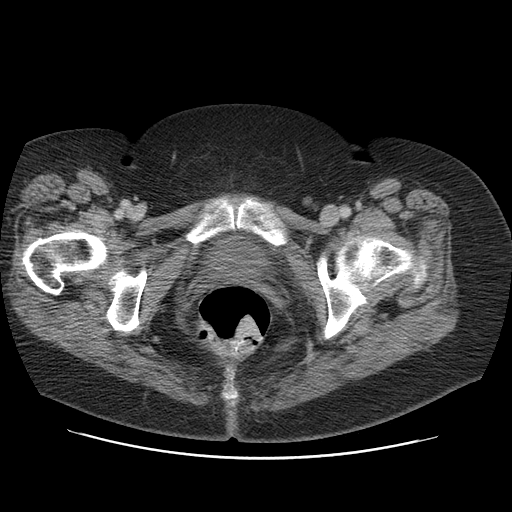
[im 10/85  bone]
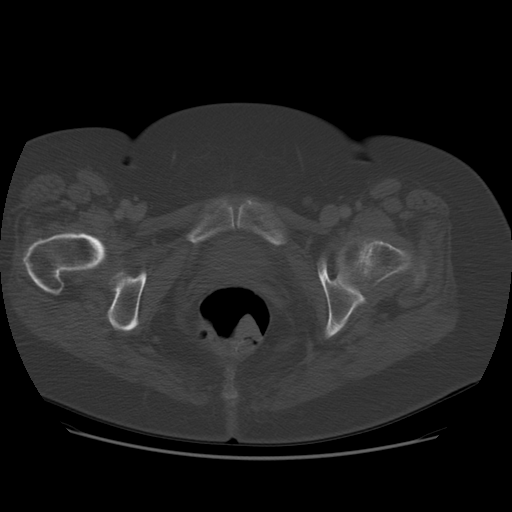
[im 19/85  soft-tissue]
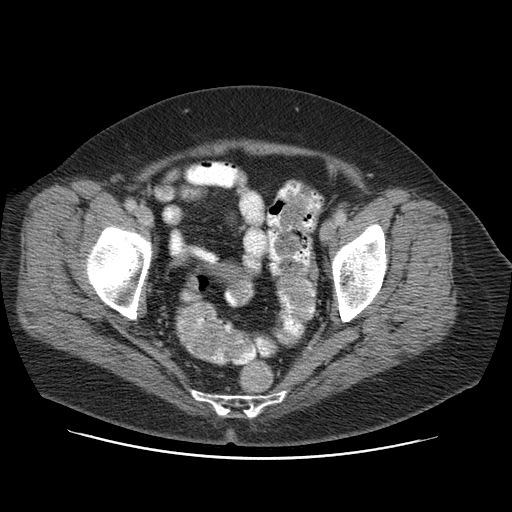
[im 29/85  soft-tissue]
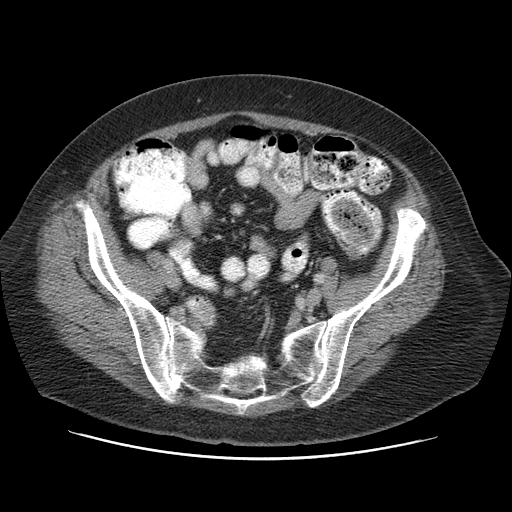
[im 38/85  soft-tissue]
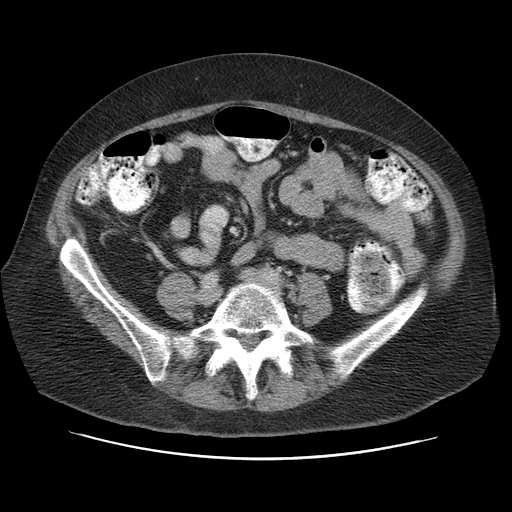
[im 47/85  soft-tissue]
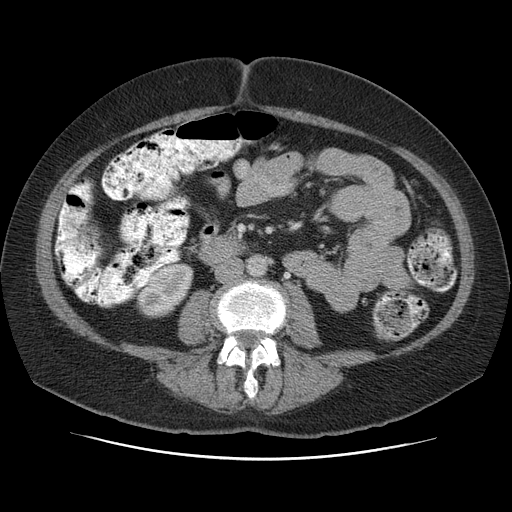
[im 47/85  lung]
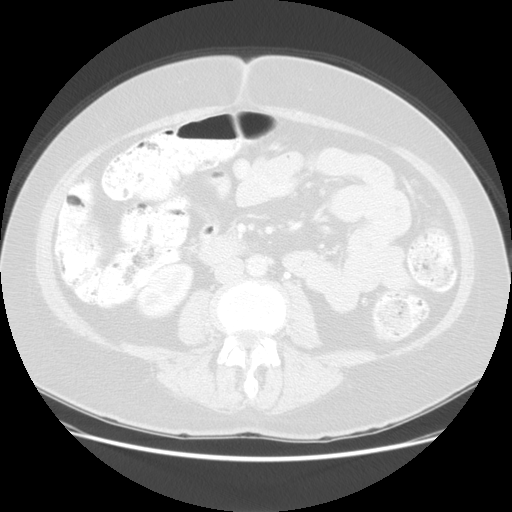
[im 57/85  soft-tissue]
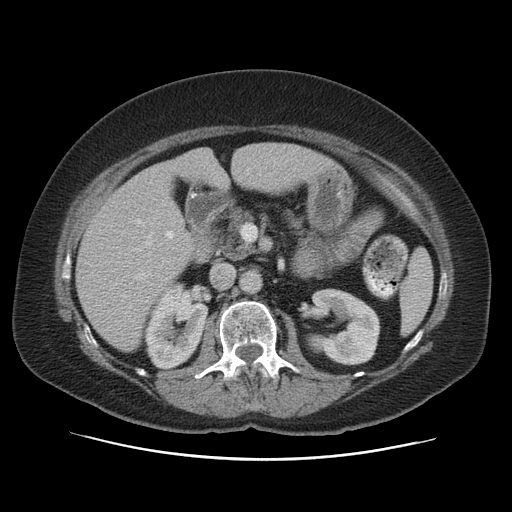
[im 57/85  lung]
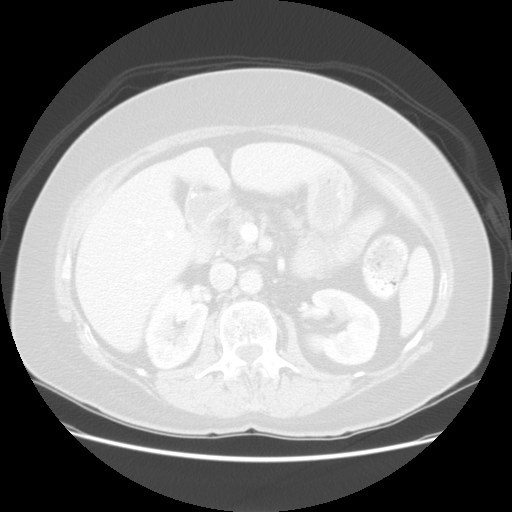
[im 66/85  soft-tissue]
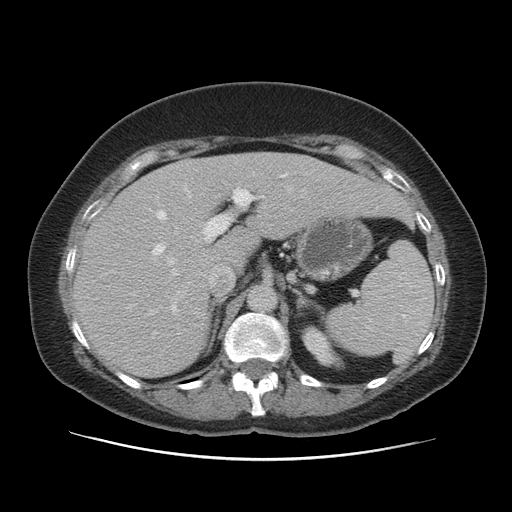
[im 66/85  lung]
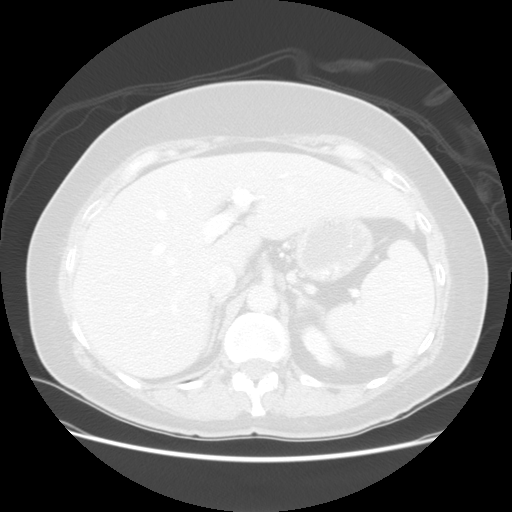
[im 75/85  soft-tissue]
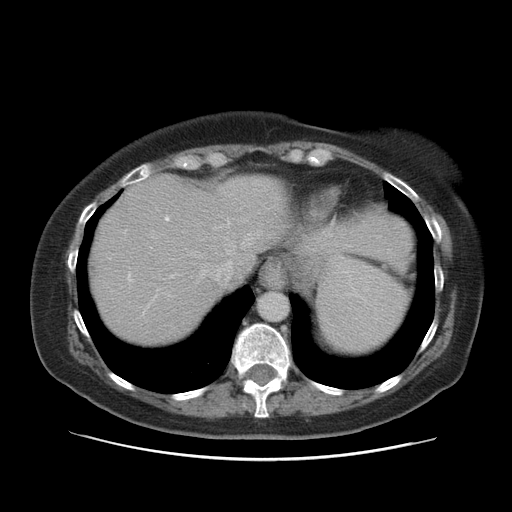
[im 75/85  lung]
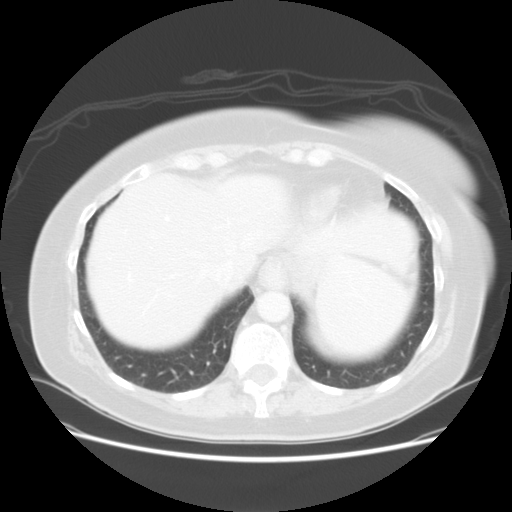

[Series 400: cor · coronal · 0.94mm/px · 3 of 127 slices shown]
[im 43/127  soft-tissue]
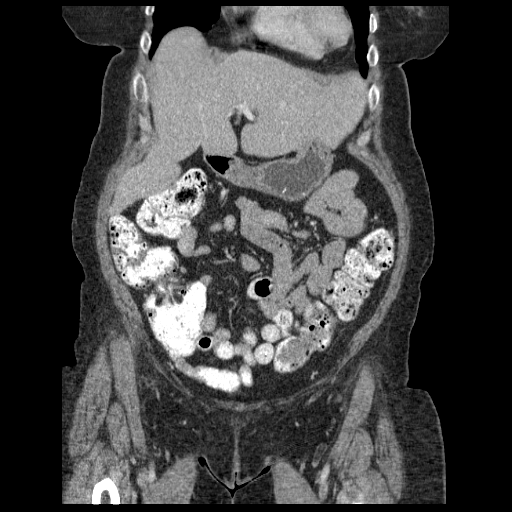
[im 57/127  soft-tissue]
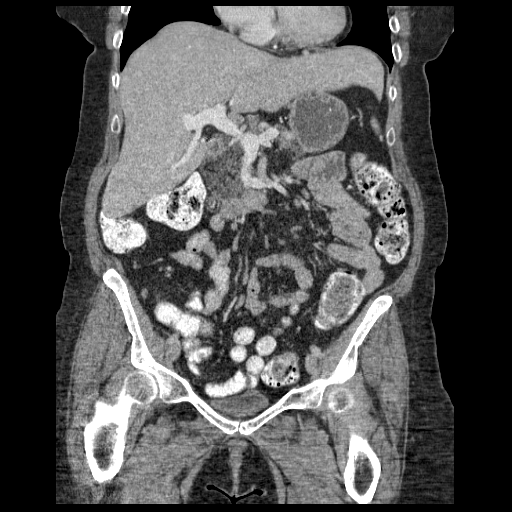
[im 71/127  soft-tissue]
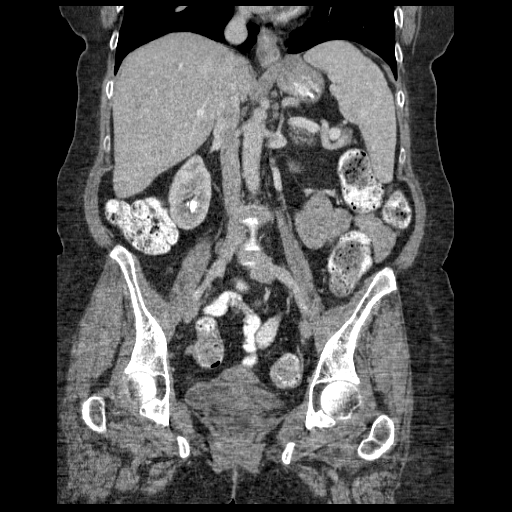

[Series 401: sag · sagittal · 0.94mm/px · 1 of 152 slices shown]
[im 51/152  soft-tissue]
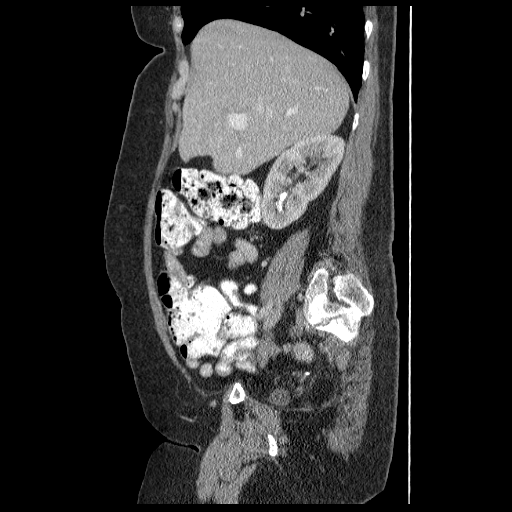

[12 of 46 positions shown; findings below may reference images not displayed]

FINDINGS: The lung bases are clear.  There is a question of a small
hiatal hernia.  The liver enhances with no focal abnormality and no
ductal dilatation is seen.  Surgical clips are present from prior
cholecystectomy.  The pancreas is normal in size and the pancreatic
duct is not dilated.  The adrenal glands and spleen are
unremarkable.  The kidneys enhance with bilateral lower pole renal
calculi present which are not obstructing. These calculi most
likely represent early staghorn formation with the lower pole right
renal calculus measuring 9 x 8 x 15 mm.  The calculus in the lower
pole left kidney measures 14 x 5 x 12 mm.  The ureters are normal
in caliber.  The abdominal aorta is normal in caliber and no
adenopathy is seen.

The uterus is normal in size.  No adnexal lesion is seen.  The
urinary bladder is not well distended.  No fluid is seen within the
pelvis.  There is a moderate amount of feces throughout the colon
but no colonic lesion is seen.  There are several small lymph nodes
in the right lower quadrant of questionable significance.  The
appendix is visualized in the right lower quadrant lateral to the
base of the cecum and is unremarkable.  The terminal ileum is
moderately well seen with no abnormality noted. There are two small
soft tissue lesions present in the right buttocks which were
present previously and may be due to prior intramuscular injections
or sebaceous cysts.

The bone lesions noted on MR involving L1 and S1 vertebral bodies
represent faintly sclerotic lesions.  In review of the sagittal
images from the CT from 9949, these lesions do appear to have been
present and do not appear to have changed.  Anterolisthesis of L5
on S1 by 8 mm is noted with degenerative disc disease at L5-S1.
IMPRESSION: 1.  No explanation for the patient's pain is seen.  The appendix is
unremarkable.  There are a few small lymph nodes in the right lower
quadrant of questionable significance.
2. Bilateral nonobstructing lower pole renal calculi which may
represent early staghorn calculi.
3.  Degenerative disc disease at L5 -S1 with 8 mm anterolisthesis
of L5 on S1.

## 2013-03-17 ENCOUNTER — Telehealth: Payer: Self-pay | Admitting: *Deleted

## 2013-03-17 MED ORDER — ALPRAZOLAM 0.25 MG PO TABS: ORAL_TABLET | ORAL | Status: DC

## 2013-03-17 NOTE — Telephone Encounter (Signed)
I just did #15, needs office visit before next refill

## 2013-03-17 NOTE — Telephone Encounter (Signed)
ALPRAZolam (XANAX) 0.25 MG tablet  Last OV: 07/16/2012  Last Refill: 01/14/2013  Last UDS: 07/16/2012 Low risk  

## 2013-03-17 NOTE — Telephone Encounter (Signed)
ALPRAZolam (XANAX) 0.25 MG tablet  Last OV: 07/16/2012  Last Refill: 01/14/2013  Last UDS: 07/16/2012 Low risk

## 2013-04-19 ENCOUNTER — Other Ambulatory Visit: Payer: Self-pay | Admitting: Internal Medicine

## 2013-04-21 NOTE — Telephone Encounter (Signed)
Paroxetine refilled per protocol. OV due

## 2013-04-25 ENCOUNTER — Other Ambulatory Visit: Payer: Self-pay | Admitting: Internal Medicine

## 2013-04-25 DIAGNOSIS — I10 Essential (primary) hypertension: Secondary | ICD-10-CM

## 2013-04-25 NOTE — Telephone Encounter (Signed)
Spoke with patient made aware that office visit is required before next medication refill. Appointment scheduled for Monday 04/28/13 at 11:00.

## 2013-04-28 ENCOUNTER — Encounter: Payer: Self-pay | Admitting: Internal Medicine

## 2013-04-28 ENCOUNTER — Ambulatory Visit (INDEPENDENT_AMBULATORY_CARE_PROVIDER_SITE_OTHER): Payer: Self-pay | Admitting: Internal Medicine

## 2013-04-28 VITALS — BP 137/83 | HR 83 | Temp 98.3°F | Wt 173.0 lb

## 2013-04-28 DIAGNOSIS — I10 Essential (primary) hypertension: Secondary | ICD-10-CM

## 2013-04-28 DIAGNOSIS — F341 Dysthymic disorder: Secondary | ICD-10-CM

## 2013-04-28 DIAGNOSIS — Z23 Encounter for immunization: Secondary | ICD-10-CM

## 2013-04-28 DIAGNOSIS — F329 Major depressive disorder, single episode, unspecified: Secondary | ICD-10-CM

## 2013-04-28 DIAGNOSIS — E785 Hyperlipidemia, unspecified: Secondary | ICD-10-CM

## 2013-04-28 DIAGNOSIS — E876 Hypokalemia: Secondary | ICD-10-CM

## 2013-04-28 DIAGNOSIS — E119 Type 2 diabetes mellitus without complications: Secondary | ICD-10-CM

## 2013-04-28 LAB — COMPREHENSIVE METABOLIC PANEL
Alkaline Phosphatase: 38 U/L — ABNORMAL LOW (ref 39–117)
BUN: 18 mg/dL (ref 6–23)
CO2: 25 mEq/L (ref 19–32)
Creatinine, Ser: 0.7 mg/dL (ref 0.4–1.2)
GFR: 90.55 mL/min (ref >60.00)
Glucose, Bld: 120 mg/dL — ABNORMAL HIGH (ref 70–99)
Total Bilirubin: 0.3 mg/dL (ref 0.3–1.2)
Total Protein: 7.3 g/dL (ref 6.0–8.3)

## 2013-04-28 LAB — CBC WITH DIFFERENTIAL/PLATELET
Basophils Relative: 0.5 % (ref 0.0–3.0)
Eosinophils Relative: 3 % (ref 0.0–5.0)
HCT: 34 % — ABNORMAL LOW (ref 36.0–46.0)
Lymphs Abs: 1.8 10*3/uL (ref 0.7–4.0)
MCHC: 31.6 g/dL (ref 30.0–36.0)
MCV: 80.9 fl (ref 78.0–100.0)
Monocytes Absolute: 0.6 10*3/uL (ref 0.1–1.0)
Monocytes Relative: 6.9 % (ref 3.0–12.0)
Platelets: 305 10*3/uL (ref 150.0–400.0)
RBC: 4.21 Mil/uL (ref 3.87–5.11)
WBC: 8.5 10*3/uL (ref 4.5–10.5)

## 2013-04-28 LAB — HEMOGLOBIN A1C: Hgb A1c MFr Bld: 7.3 % — ABNORMAL HIGH (ref 4.6–6.5)

## 2013-04-28 MED ORDER — PAROXETINE HCL 40 MG PO TABS: ORAL_TABLET | ORAL | Status: DC

## 2013-04-28 MED ORDER — DILTIAZEM HCL ER COATED BEADS 240 MG PO CP24: 240.0000 mg | ORAL_CAPSULE | Freq: Every day | ORAL | Status: DC

## 2013-04-28 MED ORDER — ZOLMITRIPTAN 5 MG PO TABS: 5.0000 mg | ORAL_TABLET | ORAL | Status: DC | PRN

## 2013-04-28 MED ORDER — MELOXICAM 7.5 MG PO TABS
7.5000 mg | ORAL_TABLET | Freq: Every day | ORAL | Status: DC | PRN
Start: 2013-04-28 — End: 2013-07-28

## 2013-04-28 MED ORDER — METFORMIN HCL 1000 MG PO TABS: ORAL_TABLET | ORAL | Status: DC

## 2013-04-28 MED ORDER — LISINOPRIL 10 MG PO TABS: ORAL_TABLET | ORAL | Status: DC

## 2013-04-28 MED ORDER — FENOFIBRATE MICRONIZED 200 MG PO CAPS: 200.0000 mg | ORAL_CAPSULE | Freq: Every day | ORAL | Status: DC

## 2013-04-28 MED ORDER — GLIMEPIRIDE 2 MG PO TABS: ORAL_TABLET | ORAL | Status: DC

## 2013-04-28 MED ORDER — ALPRAZOLAM 0.25 MG PO TABS: ORAL_TABLET | ORAL | Status: DC

## 2013-04-28 NOTE — Progress Notes (Signed)
Pre visit review using our clinic review tool, if applicable. No additional management support is needed unless otherwise documented below in the visit note. 

## 2013-04-28 NOTE — Assessment & Plan Note (Addendum)
Reports good compliance of medication, normal ambulatory BPs, refills today. She also has occasional palpitations, patient believes d/t anxiety ( Sometimes she feels emotionally overwhelmed by husband's health issues but no persistent anxiety), EKG today showed normal sinus rhythm. No chest pain or sob. Recommend observation for now, if symptoms increase she is to let me know, holter?

## 2013-04-28 NOTE — Assessment & Plan Note (Signed)
Will check LFTs, patient is not fasting,  Check FLP on return to the office

## 2013-04-28 NOTE — Patient Instructions (Signed)
Get your blood work before you leave ; also need a UDS Next visit for a physical exam fasting, in 3 months  Please make an appointment    Meloxicam 1 or 2 tablets  once a day  as needed for pain. Always take it with food because may cause gastritis and ulcers. If you notice nausea, stomach pain, change in the color of stools --->  Stop the medicine and let us know

## 2013-04-28 NOTE — Assessment & Plan Note (Addendum)
Husband had a stroke and he has other health issues, she is the caregiver, increased stress, so far patient is handling it okay athough occ has palpitations, see HTNPlan: Refill Paxil, Xanax. UDS today

## 2013-04-28 NOTE — Assessment & Plan Note (Signed)
Ongoing problem, wonders if an anti-inflammatory would be appropriate. We can try meloxicam, precautions discussed

## 2013-04-28 NOTE — Assessment & Plan Note (Addendum)
Lifestyle not as healthy as before, good med compliance? Plan--will check labs. Refills provided

## 2013-04-28 NOTE — Progress Notes (Signed)
   Subjective:    Patient ID: Julie [name], female    DOB: 03-05-1951, 62 y.o.   MRN: [id number]  HPI Routine office visit Anxiety, depression ----patient's husband had a stroke-CHF, she is the caregiver , reports  Increased stress, on Paxil and Xanax, symptoms are relatively well controlled. Hypertension--good medication compliance, ambulatory BPs reportedly normal. Right lower quadrant abdominal pain felt to be related to back, wonders if she could benefit from anti-inflammatory. Diabetes-- diet has not been as healthy as before, due to her husband medical issues, from time to time is unable to eat properly, despite that CBGs are mostly ok per pt, compliance w/ meds?  Past Medical History  Diagnosis Date  . Anxiety and depression   . Diabetes mellitus 06-2011    initial  CBGs ~400    . Migraines   . Hypertension 2011  . History of kidney stones   . Osteoporosis   . DJD (degenerative joint disease)    Past Surgical History  Procedure Laterality Date  . Cholecystectomy  2003  . Wisdom tooth extraction     History   Social History  . Marital Status: Married    Spouse Name: zunaira lamy    Number of Children: 1  . Years of Education: N/A   Occupational History  .  has a Part time job    Social History Main Topics  . Smoking status: Former Smoker    Types: Cigarettes    Quit date: 05/23/1979  . Smokeless tobacco: Never Used  . Alcohol Use: No     Comment: ocass  . Drug Use: No  . Sexual Activity: No   Other Topics Concern  . Not on file   Social History Narrative   Husband had a stroke    Review of Systems Occasional feel palpitations without associated symptoms, she thinks is stress related, symptoms have been often times when she is quite active taking care of her husband. Denies chest pain or shortness of breath No lower extremity edema Denies fever or chills. No nausea, vomiting, diarrhea or blood in the stools. No bladder or bowel incontinence.       Objective:   Physical Exam BP 137/83  Pulse 83  Temp(Src) 98.3 F (36.8 C)  Wt 173 lb (78.472 kg)  SpO2 98% General -- alert, well-developed, NAD.  Lungs -- normal respiratory effort, no intercostal retractions, no accessory muscle use, and normal breath sounds.  Heart-- normal rate, regular rhythm, no murmur.  Abdomen-- Not distended, good bowel sounds,soft, non-tender.  Extremities-- no pretibial edema bilaterally  Neurologic--  alert & oriented X3. Speech normal, gait normal, strength normal in all extremities.  Psych-- Cognition and judgment appear intact. Cooperative with normal attention span and concentration. Mildly  anxious appearing , no depressed appearing.      Assessment & Plan:

## 2013-05-02 MED ORDER — POTASSIUM CHLORIDE ER 10 MEQ PO TBCR: 10.0000 meq | EXTENDED_RELEASE_TABLET | Freq: Every day | ORAL | Status: DC

## 2013-05-02 NOTE — Addendum Note (Signed)
Addended by: Eustace Quail on: 05/02/2013 09:08 AM   Modules accepted: Orders

## 2013-05-05 ENCOUNTER — Telehealth: Payer: Self-pay | Admitting: *Deleted

## 2013-05-05 NOTE — Telephone Encounter (Signed)
Patient called and stated that provider was going to send an antiinflammatory medication to the  Pharmacy. After reviewing patients chart meloxicam was sent to the pharmacy on 04/28/2013. Called pharmacy and they haven't received anything. Medication was phoned. Patient was notified.

## 2013-05-06 ENCOUNTER — Telehealth: Payer: Self-pay | Admitting: *Deleted

## 2013-05-06 NOTE — Telephone Encounter (Signed)
Message copied by Eustace Quail on Tue May 06, 2013  3:54 PM ------      Message from: Willow Ora E      Created: Wed Apr 30, 2013  5:00 PM       Advise patient:      Potassium is low, send a prescription KCL  10 mEq one tablet by mouth daily, #30, one refill. Arrange for a potassium check in 3 weeks from today.      Diabetes needs better control, increase glymeperide 2 mg from one tablet in the morning to ---->  2 tablets in the morning, watch for low blood sugars.      Mild anemia is stable       ------

## 2013-05-06 NOTE — Telephone Encounter (Signed)
meloxicam was sent to her pharmacy

## 2013-05-06 NOTE — Telephone Encounter (Signed)
Pt notified of lab results. Lab and rx ordered. Pt states was told that she would also get a antinflammatory rx for her back.   Please advise . DJR

## 2013-05-06 NOTE — Addendum Note (Signed)
Addended by: Eustace Quail on: 05/06/2013 03:54 PM   Modules accepted: Orders

## 2013-05-27 ENCOUNTER — Other Ambulatory Visit: Payer: Self-pay

## 2013-07-23 ENCOUNTER — Telehealth: Payer: Self-pay | Admitting: *Deleted

## 2013-07-23 ENCOUNTER — Other Ambulatory Visit: Payer: Self-pay | Admitting: Internal Medicine

## 2013-07-23 MED ORDER — ALPRAZOLAM 0.25 MG PO TABS: ORAL_TABLET | ORAL | Status: DC

## 2013-07-23 NOTE — Telephone Encounter (Signed)
done

## 2013-07-23 NOTE — Telephone Encounter (Signed)
rx refill- xanxa 0.25mg  Last OV- 04/28/13 Last refilled- 04/28/13 #60 / 1 rf  Last UDS- 07/16/12 Low risk .

## 2013-07-23 NOTE — Addendum Note (Signed)
Addended by: [name] E on: 07/23/2013 05:16 PM   Modules accepted: Orders

## 2013-07-24 NOTE — Telephone Encounter (Signed)
rx faxed to [facility] [address].  

## 2013-07-25 ENCOUNTER — Telehealth: Payer: Self-pay

## 2013-07-25 NOTE — Telephone Encounter (Signed)
Left message for call back  identifiable  Pap--10/2011--neg Flu--04/2013 [facility]--? appt

## 2013-07-28 ENCOUNTER — Ambulatory Visit (INDEPENDENT_AMBULATORY_CARE_PROVIDER_SITE_OTHER): Payer: Self-pay | Admitting: Internal Medicine

## 2013-07-28 ENCOUNTER — Encounter: Payer: Self-pay | Admitting: Internal Medicine

## 2013-07-28 VITALS — BP 149/83 | HR 84 | Temp 98.2°F | Ht 66.0 in | Wt 173.0 lb

## 2013-07-28 DIAGNOSIS — Z0000 Encounter for general adult medical examination without abnormal findings: Secondary | ICD-10-CM | POA: Insufficient documentation

## 2013-07-28 DIAGNOSIS — R1031 Right lower quadrant pain: Secondary | ICD-10-CM

## 2013-07-28 DIAGNOSIS — I10 Essential (primary) hypertension: Secondary | ICD-10-CM

## 2013-07-28 DIAGNOSIS — F329 Major depressive disorder, single episode, unspecified: Secondary | ICD-10-CM

## 2013-07-28 DIAGNOSIS — D649 Anemia, unspecified: Secondary | ICD-10-CM

## 2013-07-28 DIAGNOSIS — F419 Anxiety disorder, unspecified: Secondary | ICD-10-CM

## 2013-07-28 DIAGNOSIS — E119 Type 2 diabetes mellitus without complications: Secondary | ICD-10-CM

## 2013-07-28 DIAGNOSIS — M810 Age-related osteoporosis without current pathological fracture: Secondary | ICD-10-CM

## 2013-07-28 DIAGNOSIS — M549 Dorsalgia, unspecified: Secondary | ICD-10-CM

## 2013-07-28 DIAGNOSIS — Z23 Encounter for immunization: Secondary | ICD-10-CM

## 2013-07-28 LAB — BASIC METABOLIC PANEL
BUN: 24 mg/dL — ABNORMAL HIGH (ref 6–23)
CO2: 25 meq/L (ref 19–32)
CREATININE: 0.9 mg/dL (ref 0.4–1.2)
Calcium: 9.5 mg/dL (ref 8.4–10.5)
Chloride: 110 mEq/L (ref 96–112)
GFR: 67.7 mL/min (ref >60.00)
Glucose, Bld: 161 mg/dL — ABNORMAL HIGH (ref 70–99)
Potassium: 3.8 mEq/L (ref 3.5–5.1)
SODIUM: 142 meq/L (ref 135–145)

## 2013-07-28 LAB — LIPID PANEL
CHOL/HDL RATIO: 4
Cholesterol: 180 mg/dL (ref 0–200)
HDL: 41.5 mg/dL (ref >39.00)
LDL CALC: 111 mg/dL — AB (ref 0–99)
TRIGLYCERIDES: 140 mg/dL (ref 0.0–149.0)
VLDL: 28 mg/dL (ref 0.0–40.0)

## 2013-07-28 LAB — TSH: TSH: 2.34 u[IU]/mL (ref 0.35–5.50)

## 2013-07-28 LAB — HEMOGLOBIN A1C: Hgb A1c MFr Bld: 7.5 % — ABNORMAL HIGH (ref 4.6–6.5)

## 2013-07-28 MED ORDER — METFORMIN HCL 1000 MG PO TABS: ORAL_TABLET | ORAL | Status: DC

## 2013-07-28 MED ORDER — HYDROCODONE-ACETAMINOPHEN 5-325 MG PO TABS: 1.0000 | ORAL_TABLET | Freq: Three times a day (TID) | ORAL | Status: DC | PRN

## 2013-07-28 MED ORDER — DILTIAZEM HCL ER COATED BEADS 240 MG PO CP24: 240.0000 mg | ORAL_CAPSULE | Freq: Every day | ORAL | Status: DC

## 2013-07-28 MED ORDER — MELOXICAM 7.5 MG PO TABS: 7.5000 mg | ORAL_TABLET | Freq: Every day | ORAL | Status: DC | PRN

## 2013-07-28 NOTE — Assessment & Plan Note (Addendum)
Ongoing problem, on meloxicam with some control of symptoms. Plan:  In addition to meloxicam as needed, okay to take hydrocodone prn Needs uds Needs to see ortho again (cost is an issue)

## 2013-07-28 NOTE — Assessment & Plan Note (Signed)
History of non-iron deficiency anemia, never had a colonoscopy, has no insurance at this time , unable to afford the procedure

## 2013-07-28 NOTE — Assessment & Plan Note (Signed)
Stable present care. She is under a lot of stress taking care of her husband who had a stroke but denies feeling suicidal, in general handling situation okay.

## 2013-07-28 NOTE — Progress Notes (Signed)
Pre visit review using our clinic review tool, if applicable. No additional management support is needed unless otherwise documented below in the visit note. 

## 2013-07-28 NOTE — Assessment & Plan Note (Signed)
BP slightly elevated today at 149/83, ambulatory BPs also are "slightly high", she couldn't tell me any readings. Recommend to check BP,write readings down and bring the log  See instructions.

## 2013-07-28 NOTE — Telephone Encounter (Signed)
Unable to reach prior to visit  

## 2013-07-28 NOTE — Patient Instructions (Addendum)
Get your blood work before you leave ; you also need a UDS  Check the  blood pressure 2 or 3 times a week be sure it is between 110/60 and 140/85. Ideal blood pressure is 120/80. If it is consistently higher or lower, let me know  For pain: Take meloxicam one tablet daily if needed. If you take meloxicam, then avoid similar OTC medications including ibuprofen, Advil, Motrin, naproxen. If pain continue, okay to take hydrocodone from time to time, will cause drowsiness.  Next visit is for routine check up regards your blood sugar , blood pressure anxiety  in 4 months  No need to come back fasting Please make an appointment

## 2013-07-28 NOTE — Assessment & Plan Note (Signed)
Encouraged to see gynecology

## 2013-07-28 NOTE — Assessment & Plan Note (Addendum)
Discussed diet and exercise. Discussed need to have her eyes checked. Occasionally skips a meal and has low blood sugar symptoms, encourage to avoid  skipping meals. Check a A1c.

## 2013-07-28 NOTE — Progress Notes (Signed)
Subjective:    Patient ID: Julie [name], female    DOB: [birth year], 63 y.o.   MRN: [id number]  DOS:  07/28/2013 Type of  visit: CPX We also discussed a number of issues, see a/p    ROS Diet-- not eating right Exercise-- active taking care of husband DM--- occ  sx c/w low sugars when skips meals , (-) LE paresthesias , (-) visual disturbances  No  CP, SOB No  lower extremity edema Denies  nausea, vomiting diarrhea Denies  blood in the stools (-) cough, sputum production (-) wheezing, chest congestion No dysuria, gross hematuria, difficulty urinating  No vaginal discharge, genital rash    Past Medical History  Diagnosis Date  . Anxiety and depression   . Diabetes mellitus 06-2011    initial  CBGs ~400    . Migraines   . Hypertension 2011  . History of kidney stones   . Osteoporosis   . DJD (degenerative joint disease)     Past Surgical History  Procedure Laterality Date  . Cholecystectomy  2003  . Wisdom tooth extraction      History   Social History  . Marital Status: Married    Spouse Name: avyonna wagoner    Number of Children: 1  . Years of Education: N/A   Occupational History  . not working     Social History Main Topics  . Smoking status: Former Smoker    Types: Cigarettes    Quit date: 05/23/1979  . Smokeless tobacco: Never Used  . Alcohol Use: No     Comment: ocass  . Drug Use: No  . Sexual Activity: No   Other Topics Concern  . Not on file   Social History Narrative   Husband had a stroke     Family History  Problem Relation Age of Onset  . Cancer Mother     ovary/uterus cancer  . Hyperlipidemia Mother     M and F  . Heart disease Mother     M had CHF  . Hypertension Mother     Jerilynn Mages and F  . Diabetes Father   . Mental illness Maternal Grandmother   . Hypertension Maternal Grandmother   . Breast cancer Maternal Grandmother   . Mental illness Maternal Grandfather   . Hypertension Maternal Grandfather   . Mental illness Paternal  Grandmother   . Hypertension Paternal Grandmother   . Breast cancer Paternal Grandmother   . Mental illness Paternal Grandfather   . Hypertension Paternal Grandfather   . Stomach cancer Maternal Grandfather   . Colon cancer Neg Hx        Medication List       This list is accurate as of: 07/28/13  6:00 PM.  Always use your most recent med list.               ALPRAZolam 0.25 MG tablet  Commonly known as:  XANAX  TAKE TWO TABLETS BY MOUTH AT BEDTIME AS NEEDED     aspirin 81 MG tablet  Take 81 mg by mouth daily.     diltiazem 240 MG 24 hr capsule  Commonly known as:  CARDIZEM CD  Take 1 capsule (240 mg total) by mouth daily.     fenofibrate micronized 200 MG capsule  Commonly known as:  LOFIBRA  Take 1 capsule (200 mg total) by mouth daily before breakfast.     glimepiride 2 MG tablet  Commonly known as:  AMARYL  TAKE ONE TABLET BY  MOUTH EVERY DAY BEFORE  BREAKFAST     HYDROcodone-acetaminophen 5-325 MG per tablet  Commonly known as:  NORCO/VICODIN  Take 1 tablet by mouth every 8 (eight) hours as needed.     lisinopril 10 MG tablet  Commonly known as:  PRINIVIL,ZESTRIL  TAKE ONE TABLET BY MOUTH ONCE DAILY     meloxicam 7.5 MG tablet  Commonly known as:  MOBIC  Take 1-2 tablets (7.5-15 mg total) by mouth daily as needed for pain.     metFORMIN 1000 MG tablet  Commonly known as:  GLUCOPHAGE  1/2 tablet in the morning, 1 tablet with dinner     PARoxetine 40 MG tablet  Commonly known as:  PAXIL  TAKE ONE TABLET BY MOUTH IN THE MORNING     potassium chloride 10 MEQ tablet  Commonly known as:  KLOR-CON 10  Take 1 tablet (10 mEq total) by mouth daily.     zolmitriptan 5 MG tablet  Commonly known as:  ZOMIG  Take 1 tablet (5 mg total) by mouth as needed.           Objective:   Physical Exam BP 149/83  Pulse 84  Temp(Src) 98.2 F (36.8 C)  Ht 5\' 6"  (1.676 m)  Wt 173 lb (78.472 kg)  BMI 27.94 kg/m2  SpO2 100%  General -- alert, well-developed, NAD.    Neck --no thyromegaly  HEENT-- Not pale Lungs -- normal respiratory effort, no intercostal retractions, no accessory muscle use, and normal breath sounds.  Heart-- normal rate, regular rhythm, no murmur.  Abdomen-- Not distended, good bowel sounds,soft, non-tender.  Extremities-- no pretibial edema bilaterally  Neurologic--  alert & oriented X3. Speech normal, gait normal, strength normal in all extremities.  Psych-- Cognition and judgment appear intact. Cooperative with normal attention span and concentration. No anxious or depressed appearing.     Assessment & Plan:

## 2013-07-28 NOTE — Assessment & Plan Note (Addendum)
Td ~ 2011 per pt  zostavax-- declined  $$ PNM today CCS-- never had a cscope , cost is an issue, thus will get hemocult Female care -- Encouraged to see gynecology. Mammogram 2012, no actual reports in the chart

## 2013-07-29 ENCOUNTER — Telehealth: Payer: Self-pay | Admitting: Internal Medicine

## 2013-07-29 NOTE — Telephone Encounter (Signed)
Relevant patient education mailed to patient.  

## 2013-07-30 ENCOUNTER — Encounter: Payer: Self-pay | Admitting: *Deleted

## 2013-07-30 MED ORDER — METFORMIN HCL 1000 MG PO TABS: ORAL_TABLET | ORAL | Status: DC

## 2013-07-30 MED ORDER — GLIMEPIRIDE 4 MG PO TABS: 4.0000 mg | ORAL_TABLET | Freq: Every day | ORAL | Status: DC

## 2013-07-30 NOTE — Addendum Note (Signed)
Addended by: Peggyann Shoals on: 07/30/2013 04:39 PM   Modules accepted: Orders

## 2013-08-12 ENCOUNTER — Telehealth: Payer: Self-pay

## 2013-08-12 NOTE — Telephone Encounter (Signed)
UDS: 07/28/2013 Negative for Hydrocodone and Xanax (PRN) Low Risk Per Dr Larose Kells

## 2013-09-02 ENCOUNTER — Encounter: Payer: Self-pay | Admitting: Internal Medicine

## 2013-09-16 ENCOUNTER — Telehealth: Payer: Self-pay | Admitting: Internal Medicine

## 2013-09-16 MED ORDER — HYDROCODONE-ACETAMINOPHEN 5-325 MG PO TABS: 1.0000 | ORAL_TABLET | Freq: Three times a day (TID) | ORAL | Status: DC | PRN

## 2013-09-16 NOTE — Telephone Encounter (Signed)
Done

## 2013-09-16 NOTE — Telephone Encounter (Signed)
Hydrocodone 5-325mg  Last OV- 07/28/13 Last refilled-07/28/13 #40 /  rf UDS- 07/28/13 LOW risk

## 2013-09-16 NOTE — Telephone Encounter (Signed)
Caller name: Jessice Relation to pt: Call back number:[phone] Pharmacy:  Reason for call: pt is needing a refill on RX HYDROcodone-acetaminophen (NORCO/VICODIN) 5-325 MG per tablet

## 2013-09-17 NOTE — Telephone Encounter (Signed)
Lmovm. rx rdy for pick up at our front desk.

## 2013-09-30 ENCOUNTER — Emergency Department (HOSPITAL_COMMUNITY): Payer: Self-pay

## 2013-09-30 ENCOUNTER — Inpatient Hospital Stay (HOSPITAL_COMMUNITY)
Admission: EM | Admit: 2013-09-30 | Discharge: 2013-10-02 | DRG: 378 | Disposition: A | Discharge status: Home / Self Care | Payer: Self-pay | Attending: Family Medicine | Admitting: Family Medicine

## 2013-09-30 ENCOUNTER — Encounter (HOSPITAL_COMMUNITY): Payer: Self-pay | Admitting: Emergency Medicine

## 2013-09-30 DIAGNOSIS — D72829 Elevated white blood cell count, unspecified: Secondary | ICD-10-CM | POA: Diagnosis present

## 2013-09-30 DIAGNOSIS — R609 Edema, unspecified: Secondary | ICD-10-CM | POA: Diagnosis present

## 2013-09-30 DIAGNOSIS — K209 Esophagitis, unspecified without bleeding: Secondary | ICD-10-CM | POA: Diagnosis present

## 2013-09-30 DIAGNOSIS — E785 Hyperlipidemia, unspecified: Secondary | ICD-10-CM | POA: Diagnosis present

## 2013-09-30 DIAGNOSIS — F329 Major depressive disorder, single episode, unspecified: Secondary | ICD-10-CM | POA: Diagnosis present

## 2013-09-30 DIAGNOSIS — K259 Gastric ulcer, unspecified as acute or chronic, without hemorrhage or perforation: Secondary | ICD-10-CM

## 2013-09-30 DIAGNOSIS — I10 Essential (primary) hypertension: Secondary | ICD-10-CM

## 2013-09-30 DIAGNOSIS — M1990 Unspecified osteoarthritis, unspecified site: Secondary | ICD-10-CM | POA: Diagnosis present

## 2013-09-30 DIAGNOSIS — Z833 Family history of diabetes mellitus: Secondary | ICD-10-CM

## 2013-09-30 DIAGNOSIS — Z9089 Acquired absence of other organs: Secondary | ICD-10-CM

## 2013-09-30 DIAGNOSIS — Z7982 Long term (current) use of aspirin: Secondary | ICD-10-CM

## 2013-09-30 DIAGNOSIS — K219 Gastro-esophageal reflux disease without esophagitis: Secondary | ICD-10-CM | POA: Diagnosis present

## 2013-09-30 DIAGNOSIS — K311 Adult hypertrophic pyloric stenosis: Secondary | ICD-10-CM | POA: Diagnosis present

## 2013-09-30 DIAGNOSIS — K3189 Other diseases of stomach and duodenum: Principal | ICD-10-CM | POA: Diagnosis present

## 2013-09-30 DIAGNOSIS — D62 Acute posthemorrhagic anemia: Secondary | ICD-10-CM

## 2013-09-30 DIAGNOSIS — K922 Gastrointestinal hemorrhage, unspecified: Secondary | ICD-10-CM

## 2013-09-30 DIAGNOSIS — E119 Type 2 diabetes mellitus without complications: Secondary | ICD-10-CM

## 2013-09-30 DIAGNOSIS — F419 Anxiety disorder, unspecified: Secondary | ICD-10-CM

## 2013-09-30 DIAGNOSIS — F32A Depression, unspecified: Secondary | ICD-10-CM

## 2013-09-30 DIAGNOSIS — F3289 Other specified depressive episodes: Secondary | ICD-10-CM | POA: Diagnosis present

## 2013-09-30 DIAGNOSIS — R112 Nausea with vomiting, unspecified: Secondary | ICD-10-CM | POA: Diagnosis present

## 2013-09-30 DIAGNOSIS — Z87442 Personal history of urinary calculi: Secondary | ICD-10-CM

## 2013-09-30 DIAGNOSIS — Z803 Family history of malignant neoplasm of breast: Secondary | ICD-10-CM

## 2013-09-30 DIAGNOSIS — E876 Hypokalemia: Secondary | ICD-10-CM | POA: Diagnosis present

## 2013-09-30 DIAGNOSIS — F411 Generalized anxiety disorder: Secondary | ICD-10-CM | POA: Diagnosis present

## 2013-09-30 DIAGNOSIS — R002 Palpitations: Secondary | ICD-10-CM | POA: Diagnosis present

## 2013-09-30 DIAGNOSIS — K254 Chronic or unspecified gastric ulcer with hemorrhage: Principal | ICD-10-CM | POA: Diagnosis present

## 2013-09-30 DIAGNOSIS — Z800 Family history of malignant neoplasm of digestive organs: Secondary | ICD-10-CM

## 2013-09-30 DIAGNOSIS — K208 Other esophagitis without bleeding: Secondary | ICD-10-CM | POA: Diagnosis present

## 2013-09-30 DIAGNOSIS — Z79899 Other long term (current) drug therapy: Secondary | ICD-10-CM

## 2013-09-30 DIAGNOSIS — Z87891 Personal history of nicotine dependence: Secondary | ICD-10-CM

## 2013-09-30 DIAGNOSIS — M810 Age-related osteoporosis without current pathological fracture: Secondary | ICD-10-CM | POA: Diagnosis present

## 2013-09-30 DIAGNOSIS — Z8049 Family history of malignant neoplasm of other genital organs: Secondary | ICD-10-CM

## 2013-09-30 DIAGNOSIS — Z8249 Family history of ischemic heart disease and other diseases of the circulatory system: Secondary | ICD-10-CM

## 2013-09-30 HISTORY — DX: Gastro-esophageal reflux disease without esophagitis: K21.9

## 2013-09-30 LAB — RAPID URINE DRUG SCREEN, HOSP PERFORMED
Amphetamines: NOT DETECTED
BENZODIAZEPINES: NOT DETECTED
Barbiturates: NOT DETECTED
COCAINE: NOT DETECTED
OPIATES: NOT DETECTED
Tetrahydrocannabinol: NOT DETECTED

## 2013-09-30 LAB — URINALYSIS, ROUTINE W REFLEX MICROSCOPIC
GLUCOSE, UA: NEGATIVE mg/dL
Ketones, ur: NEGATIVE mg/dL
Nitrite: NEGATIVE
PH: 5.5 (ref 5.0–8.0)
Protein, ur: NEGATIVE mg/dL
Specific Gravity, Urine: 1.021 (ref 1.005–1.030)
Urobilinogen, UA: 0.2 mg/dL (ref 0.0–1.0)

## 2013-09-30 LAB — COMPREHENSIVE METABOLIC PANEL
ALT: 18 U/L (ref 0–35)
AST: 20 U/L (ref 0–37)
Albumin: 3.6 g/dL (ref 3.5–5.2)
Alkaline Phosphatase: 48 U/L (ref 39–117)
BILIRUBIN TOTAL: 0.3 mg/dL (ref 0.3–1.2)
BUN: 30 mg/dL — ABNORMAL HIGH (ref 6–23)
CALCIUM: 9.8 mg/dL (ref 8.4–10.5)
CHLORIDE: 101 meq/L (ref 96–112)
CO2: 25 mEq/L (ref 19–32)
Creatinine, Ser: 0.78 mg/dL (ref 0.50–1.10)
GFR calc Af Amer: >90 mL/min (ref >90)
GFR calc non Af Amer: 89 mL/min — ABNORMAL LOW (ref >90)
Glucose, Bld: 195 mg/dL — ABNORMAL HIGH (ref 70–99)
Potassium: 3.4 mEq/L — ABNORMAL LOW (ref 3.7–5.3)
Sodium: 140 mEq/L (ref 137–147)
Total Protein: 7.1 g/dL (ref 6.0–8.3)

## 2013-09-30 LAB — CBC WITH DIFFERENTIAL/PLATELET
BASOS ABS: 0.1 10*3/uL (ref 0.0–0.1)
Basophils Relative: 0 % (ref 0–1)
Eosinophils Absolute: 0.3 10*3/uL (ref 0.0–0.7)
Eosinophils Relative: 2 % (ref 0–5)
HEMATOCRIT: 34.1 % — AB (ref 36.0–46.0)
Hemoglobin: 10.5 g/dL — ABNORMAL LOW (ref 12.0–15.0)
Lymphocytes Relative: 18 % (ref 12–46)
Lymphs Abs: 2.1 10*3/uL (ref 0.7–4.0)
MCH: 25 pg — ABNORMAL LOW (ref 26.0–34.0)
MCHC: 30.8 g/dL (ref 30.0–36.0)
MCV: 81.2 fL (ref 78.0–100.0)
MONO ABS: 0.7 10*3/uL (ref 0.1–1.0)
Monocytes Relative: 6 % (ref 3–12)
Neutro Abs: 8.5 10*3/uL — ABNORMAL HIGH (ref 1.7–7.7)
Neutrophils Relative %: 73 % (ref 43–77)
Platelets: 376 10*3/uL (ref 150–400)
RBC: 4.2 MIL/uL (ref 3.87–5.11)
RDW: 15 % (ref 11.5–15.5)
WBC: 11.6 10*3/uL — AB (ref 4.0–10.5)

## 2013-09-30 LAB — URINE MICROSCOPIC-ADD ON

## 2013-09-30 LAB — TYPE AND SCREEN
ABO/RH(D): A NEG
Antibody Screen: NEGATIVE

## 2013-09-30 LAB — TROPONIN I

## 2013-09-30 LAB — LIPASE, BLOOD: Lipase: 30 U/L (ref 11–59)

## 2013-09-30 LAB — ABO/RH: ABO/RH(D): A NEG

## 2013-09-30 LAB — POC OCCULT BLOOD, ED: FECAL OCCULT BLD: POSITIVE — AB

## 2013-09-30 MED ORDER — ONDANSETRON HCL 4 MG/2ML IJ SOLN
4.0000 mg | Freq: Once | INTRAMUSCULAR | Status: AC
  Administered 2013-09-30: 4 mg via INTRAVENOUS
  Filled 2013-09-30: qty 2

## 2013-09-30 MED ORDER — MELOXICAM 7.5 MG PO TABS
7.5000 mg | ORAL_TABLET | Freq: Every day | ORAL | Status: DC | PRN
  Filled 2013-09-30: qty 2

## 2013-09-30 MED ORDER — ONDANSETRON HCL 4 MG/2ML IJ SOLN
4.0000 mg | Freq: Four times a day (QID) | INTRAMUSCULAR | Status: DC | PRN
  Administered 2013-10-01: 4 mg via INTRAVENOUS
  Filled 2013-09-30: qty 2

## 2013-09-30 MED ORDER — PANTOPRAZOLE SODIUM 40 MG IV SOLR
40.0000 mg | Freq: Two times a day (BID) | INTRAVENOUS | Status: DC
  Administered 2013-09-30 – 2013-10-01 (×3): 40 mg via INTRAVENOUS
  Filled 2013-09-30 (×5): qty 40

## 2013-09-30 MED ORDER — METFORMIN HCL 500 MG PO TABS
1000.0000 mg | ORAL_TABLET | Freq: Two times a day (BID) | ORAL | Status: DC
  Administered 2013-10-01 – 2013-10-02 (×3): 1000 mg via ORAL
  Filled 2013-09-30 (×5): qty 2

## 2013-09-30 MED ORDER — ONDANSETRON HCL 4 MG PO TABS: 4.0000 mg | ORAL_TABLET | Freq: Four times a day (QID) | ORAL | Status: DC | PRN

## 2013-09-30 MED ORDER — GLIMEPIRIDE 4 MG PO TABS
4.0000 mg | ORAL_TABLET | Freq: Every day | ORAL | Status: DC
  Administered 2013-10-01 – 2013-10-02 (×2): 4 mg via ORAL
  Filled 2013-09-30 (×3): qty 1

## 2013-09-30 MED ORDER — SODIUM CHLORIDE 0.9 % IJ SOLN
3.0000 mL | Freq: Two times a day (BID) | INTRAMUSCULAR | Status: DC
  Administered 2013-10-01 (×2): 3 mL via INTRAVENOUS

## 2013-09-30 MED ORDER — DILTIAZEM HCL ER COATED BEADS 240 MG PO CP24
240.0000 mg | ORAL_CAPSULE | Freq: Every day | ORAL | Status: DC
  Administered 2013-09-30 – 2013-10-02 (×3): 240 mg via ORAL
  Filled 2013-09-30 (×3): qty 1

## 2013-09-30 MED ORDER — MORPHINE SULFATE 2 MG/ML IJ SOLN: 1.0000 mg | INTRAMUSCULAR | Status: DC | PRN

## 2013-09-30 MED ORDER — SODIUM CHLORIDE 0.9 % IV SOLN
INTRAVENOUS | Status: DC
  Administered 2013-10-01 (×2): via INTRAVENOUS

## 2013-09-30 MED ORDER — ALPRAZOLAM 0.5 MG PO TABS: 0.5000 mg | ORAL_TABLET | Freq: Every evening | ORAL | Status: DC | PRN

## 2013-09-30 MED ORDER — POTASSIUM CHLORIDE CRYS ER 20 MEQ PO TBCR
20.0000 meq | EXTENDED_RELEASE_TABLET | Freq: Once | ORAL | Status: AC
  Administered 2013-09-30: 20 meq via ORAL
  Filled 2013-09-30: qty 1

## 2013-09-30 MED ORDER — LISINOPRIL 10 MG PO TABS
10.0000 mg | ORAL_TABLET | Freq: Every day | ORAL | Status: DC
  Administered 2013-09-30 – 2013-10-02 (×3): 10 mg via ORAL
  Filled 2013-09-30 (×3): qty 1

## 2013-09-30 MED ORDER — HYDROCODONE-ACETAMINOPHEN 5-325 MG PO TABS
1.0000 | ORAL_TABLET | ORAL | Status: DC | PRN
  Filled 2013-09-30: qty 1

## 2013-09-30 MED ORDER — PAROXETINE HCL 20 MG PO TABS
40.0000 mg | ORAL_TABLET | Freq: Every morning | ORAL | Status: DC
  Administered 2013-10-01 – 2013-10-02 (×2): 40 mg via ORAL
  Filled 2013-09-30 (×2): qty 2

## 2013-09-30 MED ORDER — FAMOTIDINE 20 MG PO TABS
20.0000 mg | ORAL_TABLET | Freq: Every day | ORAL | Status: DC
  Administered 2013-09-30 – 2013-10-01 (×2): 20 mg via ORAL
  Filled 2013-09-30 (×2): qty 1

## 2013-09-30 MED ORDER — PANTOPRAZOLE SODIUM 40 MG IV SOLR
40.0000 mg | Freq: Once | INTRAVENOUS | Status: AC
  Administered 2013-09-30: 40 mg via INTRAVENOUS
  Filled 2013-09-30: qty 40

## 2013-09-30 NOTE — ED Provider Notes (Signed)
CSN: [id number]     Arrival date & time 09/30/13  1334 History   First MD Initiated Contact with Patient 09/30/13 1549     Chief Complaint  Patient presents with  . Emesis  . Diarrhea  . Abdominal Pain     (Consider location/radiation/quality/duration/timing/severity/associated sxs/prior Treatment) Patient is a 63 y.o. female presenting with abdominal pain.  Abdominal Pain Pain location:  Epigastric, LUQ and RUQ Pain quality comment:  Unable to describe  Pain severity:  Severe Onset quality:  Sudden Duration:  5 days Timing:  Intermittent Progression:  Unchanged Chronicity:  New Context: not sick contacts   Relieved by:  Nothing Worsened by:  Nothing tried Associated symptoms: chills, diarrhea (described as very dark), fever (subjective), nausea and vomiting (tea colored )   Associated symptoms: no chest pain and no shortness of breath   Associated symptoms comment:  Palpitations   Past Medical History  Diagnosis Date  . Anxiety and depression   . Diabetes mellitus 06-2011    initial  CBGs ~400    . Migraines   . Hypertension 2011  . History of kidney stones   . Osteoporosis   . DJD (degenerative joint disease)    Past Surgical History  Procedure Laterality Date  . Cholecystectomy  2003  . Wisdom tooth extraction     Family History  Problem Relation Age of Onset  . Cancer Mother     ovary/uterus cancer  . Hyperlipidemia Mother     M and F  . Heart disease Mother     M had CHF  . Hypertension Mother     Jerilynn Mages and F  . Diabetes Father   . Mental illness Maternal Grandmother   . Hypertension Maternal Grandmother   . Breast cancer Maternal Grandmother   . Mental illness Maternal Grandfather   . Hypertension Maternal Grandfather   . Mental illness Paternal Grandmother   . Hypertension Paternal Grandmother   . Breast cancer Paternal Grandmother   . Mental illness Paternal Grandfather   . Hypertension Paternal Grandfather   . Stomach cancer Maternal Grandfather    . Colon cancer Neg Hx    History  Substance Use Topics  . Smoking status: Former Smoker    Types: Cigarettes    Quit date: 05/23/1979  . Smokeless tobacco: Never Used  . Alcohol Use: No     Comment: ocass   OB History   Grav Para Term Preterm Abortions TAB SAB Ect Mult Living   1 1 1       1      Review of Systems  Constitutional: Positive for fever (subjective) and chills.  Respiratory: Negative for shortness of breath.   Cardiovascular: Negative for chest pain.  Gastrointestinal: Positive for nausea, vomiting (tea colored ), abdominal pain and diarrhea (described as very dark).  All other systems reviewed and are negative.     Allergies  Review of patient's allergies indicates no known allergies.  Home Medications   Prior to Admission medications   Medication Sig Start Date End Date Taking? Authorizing Provider  ALPRAZolam Duanne Moron) 0.25 MG tablet Take 0.5 mg by mouth at bedtime as needed for anxiety.   Yes Historical Provider, MD  aspirin 81 MG tablet Take 81 mg by mouth daily.   Yes Historical Provider, MD  diltiazem (CARDIZEM CD) 240 MG 24 hr capsule Take 1 capsule (240 mg total) by mouth daily. 07/28/13  Yes Colon Branch, MD  fenofibrate micronized (LOFIBRA) 200 MG capsule Take 1 capsule (200 mg  total) by mouth daily before breakfast. 04/28/13  Yes Colon Branch, MD  glimepiride (AMARYL) 4 MG tablet Take 1 tablet (4 mg total) by mouth daily before breakfast. 07/30/13  Yes Colon Branch, MD  lisinopril (PRINIVIL,ZESTRIL) 10 MG tablet Take 10 mg by mouth daily.   Yes Historical Provider, MD  meloxicam (MOBIC) 7.5 MG tablet Take 1-2 tablets (7.5-15 mg total) by mouth daily as needed for pain. 07/28/13  Yes Colon Branch, MD  metFORMIN (GLUCOPHAGE) 1000 MG tablet Take 1 tablet twice a day 07/30/13  Yes Colon Branch, MD  PARoxetine (PAXIL) 40 MG tablet Take 40 mg by mouth every morning.   Yes Historical Provider, MD  Ranitidine HCl (ZANTAC PO) Take 1 tablet by mouth 3 (three) times daily as  needed (reflux).   Yes Historical Provider, MD  zolmitriptan (ZOMIG) 5 MG tablet Take 5 mg by mouth as needed for migraine. 04/28/13  Yes Colon Branch, MD   BP 130/84  Pulse 110  Temp(Src) 98.2 F (36.8 C) (Oral)  Resp 18  SpO2 98% Physical Exam  Nursing note and vitals reviewed. Constitutional: She is oriented to person, place, and time. She appears well-developed and well-nourished. No distress.  HENT:  Head: Normocephalic and atraumatic.  Mouth/Throat: Oropharynx is clear and moist.  Eyes: Conjunctivae are normal. Pupils are equal, round, and reactive to light. No scleral icterus.  Neck: Neck supple.  Cardiovascular: Normal rate and intact distal pulses.  An irregularly irregular rhythm present.  Murmur heard.  Crescendo systolic murmur is present with a grade of 3/6  Pulmonary/Chest: Effort normal and breath sounds normal. No stridor. No respiratory distress. She has no rales.  Abdominal: Soft. Bowel sounds are normal. She exhibits no distension. There is no tenderness. There is no rigidity, no rebound and no guarding.  Musculoskeletal: Normal range of motion.  Neurological: She is alert and oriented to person, place, and time.  Skin: Skin is warm and dry. No rash noted.  Psychiatric: She has a normal mood and affect. Her behavior is normal.    ED Course  Procedures (including critical care time) Labs Review Labs Reviewed  CBC WITH DIFFERENTIAL - Abnormal; Notable for the following:    WBC 11.6 (*)    Hemoglobin 10.5 (*)    HCT 34.1 (*)    MCH 25.0 (*)    Neutro Abs 8.5 (*)    All other components within normal limits  COMPREHENSIVE METABOLIC PANEL - Abnormal; Notable for the following:    Potassium 3.4 (*)    Glucose, Bld 195 (*)    BUN 30 (*)    GFR calc non Af Amer 89 (*)    All other components within normal limits  URINALYSIS, ROUTINE W REFLEX MICROSCOPIC - Abnormal; Notable for the following:    APPearance CLOUDY (*)    Hgb urine dipstick LARGE (*)    Bilirubin  Urine SMALL (*)    Leukocytes, UA SMALL (*)    All other components within normal limits  URINE MICROSCOPIC-ADD ON - Abnormal; Notable for the following:    Bacteria, UA FEW (*)    All other components within normal limits  POC OCCULT BLOOD, ED - Abnormal; Notable for the following:    Fecal Occult Bld POSITIVE (*)    All other components within normal limits  LIPASE, BLOOD  TROPONIN I  TROPONIN I  URINE RAPID DRUG SCREEN (HOSP PERFORMED)  TSH  TROPONIN I  TROPONIN I  BASIC METABOLIC PANEL  CBC  HEMOGLOBIN A1C  TYPE AND SCREEN  ABO/RH    Imaging Review Dg Chest 2 View  09/30/2013   CLINICAL DATA:  Epigastric pain, cardiac palpitations, shortness of breath with exertion fever and vomiting for 5 days  EXAM: CHEST  2 VIEW  COMPARISON:  None  FINDINGS: The heart size and mediastinal contours are within normal limits. Both lungs are clear. The visualized skeletal structures are unremarkable.  IMPRESSION: No active cardiopulmonary disease.   Electronically Signed   By: Skipper Cliche M.D.   On: 09/30/2013 17:16  All radiology studies independently viewed by me.      EKG Interpretation   Date/Time:  Tuesday Sep 30 2013 16:44:15 EDT Ventricular Rate:  91 PR Interval:  138 QRS Duration: 80 QT Interval:  381 QTC Calculation: 469 R Axis:   9 Text Interpretation:  Sinus rhythm No old tracing to compare Confirmed by  [facility]  MD, TREY ([id number]) on 10/01/2013 12:24:11 AM      MDM   Final diagnoses:      GI bleeding    63 yo female presenting with intermittent epigastric abdominal pain with vomiting.  Started having diarrhea today.  On ROS complains of palpitations after vomiting.  On exam, irregularly irregular rhythm with harsh murmur (apparently new).  No significant abdominal pain or tenderness currently.  Initial labs unremarkable, but will add troponin and EKG.    Exam showed melanic stool.  Her EKG showed sinus rhythm.  (?paroxysmal a fib based on earlier exam).  Admitted to  hospitalist.  Discussed with [facility] via telephone who will evaluate in AM.    Houston Siren III, MD 10/01/13 [phone]

## 2013-09-30 NOTE — Progress Notes (Signed)
Attempted to call and get report at [facility]. Was on hold for 5 mins. Will attempt again later or await callback  Marylou Mccoy RN

## 2013-09-30 NOTE — ED Notes (Signed)
Pt states that she has been vomiting for 5 days, having upper abd pain that radiates to her back for 3 days , chills and sweats for couple of days as well. Pt also states that she had diarrhea this morning.  Pt states that she has problems with acid reflux.

## 2013-09-30 NOTE — H&P (Addendum)
[facility] History and Physical  Julie [name] CWC:[id number] DOB: 1950-07-22 DOA: 09/30/2013  Referring physician: ER physician PCP: [name], MD   Chief Complaint: abdominal pain, nausea and vomiting  HPI:  Pt is 63 yo female who presents to [facility] ED with main concern of several days duration of progressively worsening epigastric pain, intermittent and throbbing in nature, associated with nausea and non bloody vomiting for past 5 days prior to this admission. She reported black, maroon color stool for past 24 hours. Pt reports epigastric pain is non radiating, 5/10 in severity, no specific alleviating or aggravating factors. Pt reports taking multiple OTC meds including aspirin, Excedrin, motrin/ ibuprofen. Pt denies similar events in the past. Pt also denies fevers, chills, no specific urinary concerns, no chest pain or shortness of breath.  In ED, pt is hemodynamically stable, on telemetry noted to have irregular rate and rhythm, HR 90 - 110. TRH asked to admit for further evaluation.   Assessment and Plan:  Principal Problem:   GI bleeding - possible due to OTC she is taking for pain relief - FOBT positive - will ask for GI consultation for further recommendations - hold heparin products, place on SCD for DVT prophylaxis  - CBC In AM Active Problems:   Acute blood loss anemia - no indication for transfusion  - repeat CBC in AM   Diabetes mellitus - continue home medical regimen   HTN (hypertension) - continue home medical regimen    Hyperlipidemia - continue fenofibrate    Anxiety and depression - stable    Leukocytosis - secondary    Hypokalemia - mild, will supplement and repeat BMP In AM   Palpitations - 12 lead EKG, cycle CE's  - check TSH  - monitor on telemetry    Nausea & vomiting - unclear etiology  - supportive care with IVF, analgesia, antiemetics as needed    Radiological Exams on Admission: Dg Chest 2 View 09/30/2013     IMPRESSION: No active  cardiopulmonary disease.    EKG: Normal sinus rhythm  Code Status: Full Family Communication: Pt at bedside Disposition Plan: Admit for further evaluation  Robbie Lis, MD  [facility] Pager [phone]  Review of Systems:  Constitutional:  Negative for diaphoresis.  HENT: Negative for hearing loss, ear pain, nosebleeds, congestion, sore throat, neck pain, tinnitus and ear discharge.   Eyes: Negative for blurred vision, double vision, photophobia, pain, discharge and redness.  Respiratory: Negative for cough, hemoptysis, sputum production, shortness of breath, wheezing and stridor.   Cardiovascular: Negative for chest pain, palpitations, orthopnea, claudication and leg swelling.  Gastrointestinal: per HPI Genitourinary: Negative for dysuria, urgency, frequency, hematuria and flank pain.  Musculoskeletal: Negative for myalgias, back pain, joint pain and falls.  Skin: Negative for itching and rash.  Neurological: Negative for speech change, focal weakness, loss of consciousness and headaches.  Endo/Heme/Allergies: Negative for environmental allergies and polydipsia. Does not bruise/bleed easily.  Psychiatric/Behavioral: Negative for suicidal ideas. The patient is not nervous/anxious.      Past Medical History  Diagnosis Date  . Anxiety and depression   . Diabetes mellitus 06-2011    initial  CBGs ~400    . Migraines   . Hypertension 2011  . History of kidney stones   . Osteoporosis   . DJD (degenerative joint disease)    Past Surgical History  Procedure Laterality Date  . Cholecystectomy  2003  . Wisdom tooth extraction     Social History:  reports that she quit  smoking about 34 years ago. Her smoking use included Cigarettes. She smoked 0.00 packs per day. She has never used smokeless tobacco. She reports that she does not drink alcohol or use illicit drugs.  No Known Allergies  Family History  Problem Relation Age of Onset  . Cancer Mother     ovary/uterus cancer   . Hyperlipidemia Mother     M and F  . Heart disease Mother     M had CHF  . Hypertension Mother     Jerilynn Mages and F  . Diabetes Father   . Mental illness Maternal Grandmother   . Hypertension Maternal Grandmother   . Breast cancer Maternal Grandmother   . Mental illness Maternal Grandfather   . Hypertension Maternal Grandfather   . Mental illness Paternal Grandmother   . Hypertension Paternal Grandmother   . Breast cancer Paternal Grandmother   . Mental illness Paternal Grandfather   . Hypertension Paternal Grandfather   . Stomach cancer Maternal Grandfather   . Colon cancer Neg Hx      Prior to Admission medications   Medication Sig Start Date End Date Taking? Authorizing Provider  ALPRAZolam Duanne Moron) 0.25 MG tablet Take 0.5 mg by mouth at bedtime as needed for anxiety.   Yes Historical Provider, MD  aspirin 81 MG tablet Take 81 mg by mouth daily.   Yes Historical Provider, MD  diltiazem (CARDIZEM CD) 240 MG 24 hr capsule Take 1 capsule (240 mg total) by mouth daily. 07/28/13  Yes Colon Branch, MD  fenofibrate micronized (LOFIBRA) 200 MG capsule Take 1 capsule (200 mg total) by mouth daily before breakfast. 04/28/13  Yes Colon Branch, MD  glimepiride (AMARYL) 4 MG tablet Take 1 tablet (4 mg total) by mouth daily before breakfast. 07/30/13  Yes Colon Branch, MD  lisinopril (PRINIVIL,ZESTRIL) 10 MG tablet Take 10 mg by mouth daily.   Yes Historical Provider, MD  meloxicam (MOBIC) 7.5 MG tablet Take 1-2 tablets (7.5-15 mg total) by mouth daily as needed for pain. 07/28/13  Yes Colon Branch, MD  metFORMIN (GLUCOPHAGE) 1000 MG tablet Take 1 tablet twice a day 07/30/13  Yes Colon Branch, MD  PARoxetine (PAXIL) 40 MG tablet Take 40 mg by mouth every morning.   Yes Historical Provider, MD  Ranitidine HCl (ZANTAC PO) Take 1 tablet by mouth 3 (three) times daily as needed (reflux).   Yes Historical Provider, MD  zolmitriptan (ZOMIG) 5 MG tablet Take 5 mg by mouth as needed for migraine. 04/28/13  Yes Colon Branch, MD    Physical Exam: Filed Vitals:   09/30/13 1341 09/30/13 1650  BP: 130/84 133/79  Pulse: 110 96  Temp: 98.2 F (36.8 C) 98.1 F (36.7 C)  TempSrc: Oral Oral  Resp: 18 18  SpO2: 98% 98%    Physical Exam  Constitutional: Appears well-developed and well-nourished. No distress.  HENT: Normocephalic. External right and left ear normal. Oropharynx is clear and moist.  Eyes: Conjunctivae and EOM are normal. PERRLA, no scleral icterus.  Neck: Normal ROM. Neck supple. No JVD. No tracheal deviation. No thyromegaly.  CVS: IRRR, S1/S2 +, SEM 4/6, no gallops, no carotid bruit.  Pulmonary: Effort and breath sounds normal, no stridor, rhonchi, wheezes, rales.  Abdominal: Soft. BS +,  no distension, tenderness in epigastric area, no rebound or guarding.  Musculoskeletal: Normal range of motion. No edema and no tenderness.  Lymphadenopathy: No lymphadenopathy noted, cervical, inguinal. Neuro: Alert. Normal reflexes, muscle tone coordination. No cranial nerve deficit.  Skin: Skin is warm and dry. No rash noted. Not diaphoretic. No erythema. No pallor.  Psychiatric: Normal mood and affect. Behavior, judgment, thought content normal.   Labs on Admission:  Basic Metabolic Panel:  Recent Labs Lab 09/30/13 1358  NA 140  K 3.4*  CL 101  CO2 25  GLUCOSE 195*  BUN 30*  CREATININE 0.78  CALCIUM 9.8   Liver Function Tests:  Recent Labs Lab 09/30/13 1358  AST 20  ALT 18  ALKPHOS 48  BILITOT 0.3  PROT 7.1  ALBUMIN 3.6    Recent Labs Lab 09/30/13 1358  LIPASE 30   CBC:  Recent Labs Lab 09/30/13 1358  WBC 11.6*  NEUTROABS 8.5*  HGB 10.5*  HCT 34.1*  MCV 81.2  PLT 376   Cardiac Enzymes:  Recent Labs Lab 09/30/13 1649  TROPONINI <0.30    If 7PM-7AM, please contact night-coverage [email or web address] Password [facility] 09/30/2013, 7:12 PM

## 2013-10-01 ENCOUNTER — Encounter (HOSPITAL_COMMUNITY)
Admission: EM | Disposition: A | Discharge status: Home / Self Care | Payer: Self-pay | Source: Home / Self Care | Attending: Family Medicine

## 2013-10-01 ENCOUNTER — Encounter (HOSPITAL_COMMUNITY): Payer: Self-pay | Admitting: Nurse Practitioner

## 2013-10-01 DIAGNOSIS — R112 Nausea with vomiting, unspecified: Secondary | ICD-10-CM

## 2013-10-01 DIAGNOSIS — K259 Gastric ulcer, unspecified as acute or chronic, without hemorrhage or perforation: Secondary | ICD-10-CM

## 2013-10-01 HISTORY — PX: ESOPHAGOGASTRODUODENOSCOPY: SHX5428

## 2013-10-01 LAB — BASIC METABOLIC PANEL
BUN: 29 mg/dL — ABNORMAL HIGH (ref 6–23)
CALCIUM: 9.5 mg/dL (ref 8.4–10.5)
CHLORIDE: 105 meq/L (ref 96–112)
CO2: 26 mEq/L (ref 19–32)
Creatinine, Ser: 0.9 mg/dL (ref 0.50–1.10)
GFR calc Af Amer: 79 mL/min — ABNORMAL LOW (ref >90)
GFR calc non Af Amer: 68 mL/min — ABNORMAL LOW (ref >90)
Glucose, Bld: 168 mg/dL — ABNORMAL HIGH (ref 70–99)
Potassium: 3.4 mEq/L — ABNORMAL LOW (ref 3.7–5.3)
SODIUM: 141 meq/L (ref 137–147)

## 2013-10-01 LAB — CBC
HEMATOCRIT: 30 % — AB (ref 36.0–46.0)
Hemoglobin: 9.4 g/dL — ABNORMAL LOW (ref 12.0–15.0)
MCH: 25.4 pg — ABNORMAL LOW (ref 26.0–34.0)
MCHC: 31.3 g/dL (ref 30.0–36.0)
MCV: 81.1 fL (ref 78.0–100.0)
PLATELETS: 284 10*3/uL (ref 150–400)
RBC: 3.7 MIL/uL — AB (ref 3.87–5.11)
RDW: 15.1 % (ref 11.5–15.5)
WBC: 8.9 10*3/uL (ref 4.0–10.5)

## 2013-10-01 LAB — TROPONIN I

## 2013-10-01 LAB — TSH: TSH: 1.75 u[IU]/mL (ref 0.350–4.500)

## 2013-10-01 LAB — HEMOGLOBIN A1C
HEMOGLOBIN A1C: 6.8 % — AB (ref <5.7)
MEAN PLASMA GLUCOSE: 148 mg/dL — AB (ref <117)

## 2013-10-01 SURGERY — EGD (ESOPHAGOGASTRODUODENOSCOPY)
Anesthesia: Moderate Sedation

## 2013-10-01 MED ORDER — PROMETHAZINE HCL 25 MG/ML IJ SOLN: 12.5000 mg | Freq: Four times a day (QID) | INTRAMUSCULAR | Status: DC | PRN

## 2013-10-01 MED ORDER — FENTANYL CITRATE 0.05 MG/ML IJ SOLN
INTRAMUSCULAR | Status: AC
  Filled 2013-10-01: qty 2

## 2013-10-01 MED ORDER — FENTANYL CITRATE 0.05 MG/ML IJ SOLN
INTRAMUSCULAR | Status: DC | PRN
  Administered 2013-10-01 (×2): 25 ug via INTRAVENOUS

## 2013-10-01 MED ORDER — MIDAZOLAM HCL 10 MG/2ML IJ SOLN
INTRAMUSCULAR | Status: DC | PRN
  Administered 2013-10-01 (×3): 2 mg via INTRAVENOUS

## 2013-10-01 MED ORDER — ONDANSETRON HCL 4 MG/2ML IJ SOLN
4.0000 mg | Freq: Two times a day (BID) | INTRAMUSCULAR | Status: DC
  Administered 2013-10-01 – 2013-10-02 (×2): 4 mg via INTRAVENOUS
  Filled 2013-10-01 (×2): qty 2

## 2013-10-01 MED ORDER — MIDAZOLAM HCL 10 MG/2ML IJ SOLN
INTRAMUSCULAR | Status: AC
  Filled 2013-10-01: qty 2

## 2013-10-01 NOTE — Progress Notes (Signed)
Patient given handout on GI Bleeding with signs/symptoms and at home instructions. Patient also informed by GI that she needs to avoid NSAIDS. Patient was given a handout on NSAIDS and why to avoid them and examples of NSAIDS. Patient understood.

## 2013-10-01 NOTE — Consult Note (Signed)
Consultation  Referring Provider: [facility] (Dr. Charlies Silvers)     Primary Care Physician:  [name], MD Primary Gastroenterologist:  Scarlette Shorts, MD       Reason for Consultation:  GI Bleed            HPI:   Julie [name] is a 63 y.o. female admitted yesterday with epigastric pain, nausea and vomiting. She reported melenic stools. Symptoms began with nausea/vomiting/diffuse upper abdominal pain 5 days ago. Patient initially thought she ate something bad. No sick contacts. Never had this type pain before. Emesis was non-bloody. After 5 days of these symptoms patient began having very dark BMs (non bismuth). She recalls waking up several times drenched in sweat. Patient takes NSAIDS for sciatic pain. Her only other GI complaint is reflux over the last month or so. Symptoms mainly at night. She has been taking Zantac over the last month for this. She has no other GI complaints.   We saw patient in the office in 2013 for RUQ pain, felt to be musculoskeletal in nature. Screening colonoscopy was discussed at that visit. Patient wanted to think about it, she hasn't had one yet.    Past Medical History  Diagnosis Date  . Anxiety and depression   . Diabetes mellitus 06-2011    initial  CBGs ~400    . Migraines   . Hypertension 2011  . History of kidney stones   . Osteoporosis   . DJD (degenerative joint disease)   . GERD (gastroesophageal reflux disease)     Past Surgical History  Procedure Laterality Date  . Cholecystectomy  2003  . Wisdom tooth extraction      Family History  Problem Relation Age of Onset  . Cancer Mother     ovary/uterus cancer  . Hyperlipidemia Mother     M and F  . Heart disease Mother     M had CHF  . Hypertension Mother     Jerilynn Mages and F  . Diabetes Father   . Mental illness Maternal Grandmother   . Hypertension Maternal Grandmother   . Breast cancer Maternal Grandmother   . Mental illness Maternal Grandfather   . Hypertension Maternal Grandfather   .  Mental illness Paternal Grandmother   . Hypertension Paternal Grandmother   . Breast cancer Paternal Grandmother   . Mental illness Paternal Grandfather   . Hypertension Paternal Grandfather   . Stomach cancer Maternal Grandfather   . Colon cancer Neg Hx      History  Substance Use Topics  . Smoking status: Former Smoker    Types: Cigarettes    Quit date: 05/23/1979  . Smokeless tobacco: Never Used  . Alcohol Use: No     Comment: ocass    Prior to Admission medications   Medication Sig Start Date End Date Taking? Authorizing Provider  ALPRAZolam Duanne Moron) 0.25 MG tablet Take 0.5 mg by mouth at bedtime as needed for anxiety.   Yes Historical Provider, MD  aspirin 81 MG tablet Take 81 mg by mouth daily.   Yes Historical Provider, MD  diltiazem (CARDIZEM CD) 240 MG 24 hr capsule Take 1 capsule (240 mg total) by mouth daily. 07/28/13  Yes Colon Branch, MD  fenofibrate micronized (LOFIBRA) 200 MG capsule Take 1 capsule (200 mg total) by mouth daily before breakfast. 04/28/13  Yes Colon Branch, MD  glimepiride (AMARYL) 4 MG tablet Take 1 tablet (4 mg total) by mouth daily before breakfast. 07/30/13  Yes Jose  Ladona Horns, MD  lisinopril (PRINIVIL,ZESTRIL) 10 MG tablet Take 10 mg by mouth daily.   Yes Historical Provider, MD  meloxicam (MOBIC) 7.5 MG tablet Take 1-2 tablets (7.5-15 mg total) by mouth daily as needed for pain. 07/28/13  Yes Colon Branch, MD  metFORMIN (GLUCOPHAGE) 1000 MG tablet Take 1 tablet twice a day 07/30/13  Yes Colon Branch, MD  PARoxetine (PAXIL) 40 MG tablet Take 40 mg by mouth every morning.   Yes Historical Provider, MD  Ranitidine HCl (ZANTAC PO) Take 1 tablet by mouth 3 (three) times daily as needed (reflux).   Yes Historical Provider, MD  zolmitriptan (ZOMIG) 5 MG tablet Take 5 mg by mouth as needed for migraine. 04/28/13  Yes Colon Branch, MD    Current Facility-Administered Medications  Medication Dose Route Frequency Provider Last Rate Last Dose  . 0.9 %  sodium chloride infusion    Intravenous Continuous Robbie Lis, MD 50 mL/hr at 10/01/13 0535    . ALPRAZolam Duanne Moron) tablet 0.5 mg  0.5 mg Oral QHS PRN Robbie Lis, MD      . diltiazem [facility] CD) 24 hr capsule 240 mg  240 mg Oral Daily Robbie Lis, MD   240 mg at 10/01/13 5366  . famotidine (PEPCID) tablet 20 mg  20 mg Oral Daily Robbie Lis, MD   20 mg at 10/01/13 4403  . glimepiride (AMARYL) tablet 4 mg  4 mg Oral QAC breakfast Robbie Lis, MD   4 mg at 10/01/13 4742  . HYDROcodone-acetaminophen (NORCO/VICODIN) 5-325 MG per tablet 1-2 tablet  1-2 tablet Oral Q4H PRN Robbie Lis, MD      . lisinopril (PRINIVIL,ZESTRIL) tablet 10 mg  10 mg Oral Daily Robbie Lis, MD   10 mg at 10/01/13 0929  . meloxicam (MOBIC) tablet 7.5-15 mg  7.5-15 mg Oral Daily PRN Robbie Lis, MD      . metFORMIN (GLUCOPHAGE) tablet 1,000 mg  1,000 mg Oral BID WC Robbie Lis, MD   1,000 mg at 10/01/13 5956  . morphine 2 MG/ML injection 1 mg  1 mg Intravenous Q4H PRN Robbie Lis, MD      . ondansetron [facility]) tablet 4 mg  4 mg Oral Q6H PRN Robbie Lis, MD       Or  . ondansetron [facility]) injection 4 mg  4 mg Intravenous Q6H PRN Robbie Lis, MD      . pantoprazole (PROTONIX) injection 40 mg  40 mg Intravenous Q12H Robbie Lis, MD   40 mg at 10/01/13 3875  . PARoxetine (PAXIL) tablet 40 mg  40 mg Oral q morning - 10a Robbie Lis, MD   40 mg at 10/01/13 6433  . sodium chloride 0.9 % injection 3 mL  3 mL Intravenous Q12H Robbie Lis, MD   3 mL at 10/01/13 0929    Allergies as of 09/30/2013  . (No Known Allergies)    Review of Systems:    All systems reviewed and negative except where noted in HPI.   Physical Exam:  Vital signs in last 24 hours: Temp:  [98.1 F (36.7 C)-98.5 F (36.9 C)] 98.1 F (36.7 C) (05/13 0411) Pulse Rate:  [91-110] 91 (05/13 0411) Resp:  [18] 18 (05/13 0411) BP: (102-135)/(60-84) 102/60 mmHg (05/13 0411) SpO2:  [98 %-99 %] 99 % (05/13 0411) Weight:  [161 lb 13.1 oz (73.4 kg)-162 lb 4.1  oz (73.6 kg)] 162 lb 4.1 oz (  73.6 kg) (05/13 0411) Last BM Date:  (pta) General:   Pleasant white female in NAD Head:  Normocephalic and atraumatic. Eyes:   No icterus.   Conjunctiva pink. Ears:  Normal auditory acuity. Neck:  Supple; no masses felt Lungs:  Respirations even and unlabored. Lungs clear to auscultation bilaterally.   No wheezes, crackles, or rhonchi.  Heart:  Regular rate and rhythm Abdomen:  Soft, nondistended, nontender. Normal bowel sounds. No appreciable masses or hepatomegaly.  Rectal:  Not repeated, heme + in ED.  Msk:  Symmetrical without gross deformities.  Extremities:  Without edema. Neurologic:  Alert and  oriented x4;  grossly normal neurologically. Skin:  Intact without significant lesions or rashes. Cervical Nodes:  No significant cervical adenopathy. Psych:  Alert and cooperative. Normal affect.  LAB RESULTS:  Recent Labs  09/30/13 1358 10/01/13 0133  WBC 11.6* 8.9  HGB 10.5* 9.4*  HCT 34.1* 30.0*  PLT 376 284   BMET  Recent Labs  09/30/13 1358 10/01/13 0133  NA 140 141  K 3.4* 3.4*  CL 101 105  CO2 25 26  GLUCOSE 195* 168*  BUN 30* 29*  CREATININE 0.78 0.90  CALCIUM 9.8 9.5   LFT  Recent Labs  09/30/13 1358  PROT 7.1  ALBUMIN 3.6  AST 20  ALT 18  ALKPHOS 48  BILITOT 0.3  STUDIES: Dg Chest 2 View  09/30/2013   CLINICAL DATA:  Epigastric pain, cardiac palpitations, shortness of breath with exertion fever and vomiting for 5 days  EXAM: CHEST  2 VIEW  COMPARISON:  None  FINDINGS: The heart size and mediastinal contours are within normal limits. Both lungs are clear. The visualized skeletal structures are unremarkable.  IMPRESSION: No active cardiopulmonary disease.   Electronically Signed   By: Skipper Cliche M.D.   On: 09/30/2013 17:16    PREVIOUS ENDOSCOPIES:            none   Impression / Plan:   52. 63 year old female with epigastric pain, nausea, vomiting and melena in setting of significant NSAID use. Hemodynamically  stable. Rule out PUD. Patient will be scheduled for EGD. The benefits, risks, and potential complications of EGD with possible biopsies were discussed with the patient and she agrees to proceed. Continue BID PPI.   2. Normocytic anemia. Hgb down about one gram from her baseline. Hopefully it will stabilize as no active bleeding today  3. GERD. Burning in throat(mainly at night) over the last month. She has been taking Zantac for symptoms  4. HTN / depression / migraines / diabetes  Thanks   LOS: 1 day   Willia Craze  10/01/2013, 9:34 AM    ________________________________________________________________________  [facility] MD note:  I personally examined the patient, reviewed the data and agree with the assessment and plan described above.  She takes 7-10 NSAIDs per day and has been for about a year due to back pains.  H2 blocker and tums for about 2 months. Now with melena, abd pain, nausea/vomiting.  I suspect NSAID induced PUD.  Planning on EGD today.   Owens Loffler, MD [facility] Pager [phone]

## 2013-10-01 NOTE — Care Management Note (Addendum)
    Page 1 of 1   10/02/2013     1:42:46 PM CARE MANAGEMENT NOTE 10/02/2013  Patient:  Julie [name], Julie [name]   Account Number:  [id number]  Date Initiated:  10/01/2013  Documentation initiated by:  Dessa Phi  Subjective/Objective Assessment:   63 Y/O F ADMITTED W/ABD PAIN,N/V.GIB.     Action/Plan:   FROM HOME W/SPOUSE.NO INSURANCE.HAS PCP-DR. PAZ.HAS PHARMACY-[facility].   Anticipated DC Date:  10/02/2013   Anticipated DC Plan:  HOME/SELF CARE  In-house referral  Financial Counselor      DC Planning Services  CM consult      Choice offered to / List presented to:             Status of service:  Completed, signed off Medicare Important Message given?   (If response is "NO", the following Medicare IM given date fields will be blank) Date Medicare IM given:   Date Additional Medicare IM given:    Discharge Disposition:  HOME/SELF CARE  Per UR Regulation:  Reviewed for med. necessity/level of care/duration of stay  If discussed at [place] of Stay Meetings, dates discussed:    Comments:  10/02/13 Kamon Fahr RN,BSN NCM [phone] D/C HOME NO FURTHER NEEDS OR ORDERS.  10/01/13 Sherod Cisse RN,BSN NCM [phone] GI-EGD.PROVIDED W/HEALTH INSURANCE RESOURCES,COMMUNITY RESOURCES,$4 [facility] LIST.NO FURTHER ANTICIPATED D/C NEEDS.

## 2013-10-01 NOTE — Interval H&P Note (Signed)
History and Physical Interval Note:  10/01/2013 1:04 PM  Julie [name]  has presented today for surgery, with the diagnosis of melena, epigastric pain, vomiting  The various methods of treatment have been discussed with the patient and family. After consideration of risks, benefits and other options for treatment, the patient has consented to  Procedure(s): ESOPHAGOGASTRODUODENOSCOPY (EGD) (N/A) as a surgical intervention .  The patient's history has been reviewed, patient examined, no change in status, stable for surgery.  I have reviewed the patient's chart and labs.  Questions were answered to the patient's satisfaction.     Milus Banister

## 2013-10-01 NOTE — Progress Notes (Signed)
[facility] PROGRESS NOTE  Julie [name] YIR:[id number] DOB: 1950/06/23 DOA: 09/30/2013 PCP: [name], MD  Assessment/Plan:   Principal Problem:   GI bleeding/gastric ulcer -  GI on board and has recommended the following: She needs to completely avoid NSAIDs, ASA products. She needs to  stay on PPI twice daily for now. With the partial gastric outlet  obstruction she should probably be on full liquid diet for another  3-4 before reintroducing solid food. Should take daily, scheduled  antiemetics at least for another 2-3 weeks (zofran 4mg  twice  daily). She is OK to d/c home as soon as she is able to tolerate  full liquid diet with the above meds. [facility] will arrange for  follow up office visit in 4-5 weeks and from there will arrange  repeat EGD pending her clinical course. - Will wait and see if patient able to tolerate her full liquid diet.  Active Problems:   Diabetes mellitus - Pt on Metformin - Blood sugars relatively well controlled.    HTN (hypertension) - Pt currently on lisinopril and cardizem - Blood pressures relatively well controlled on this regimen.    Anxiety and depression - stable continue paxil    Acute blood loss anemia - stable blood loss most likely 2ary to principle problem listed above.    Leukocytosis - Resolved, most likely stress mediated    Hypokalemia - Mild, suspect secondary to poor oral intake. - Should improve with improved oral intake    Nausea & vomiting - Resolved  Code Status: Full Family Communication: Discussed with patient and family Disposition Plan: As listed above   Consultants:  GI  Procedures:  EGD  Antibiotics:  None  HPI/Subjective: Patient has no new complaints  Objective: Filed Vitals:   10/01/13 1432  BP: 115/68  Pulse: 79  Temp: 97.6 F (36.4 C)  Resp: 16    Intake/Output Summary (Last 24 hours) at 10/01/13 1736 Last data filed at 10/01/13 1700  Gross per 24 hour  Intake 698.33  ml  Output    475 ml  Net 223.33 ml   Filed Weights   09/30/13 2029 10/01/13 0411  Weight: 73.4 kg (161 lb 13.1 oz) 73.6 kg (162 lb 4.1 oz)    Exam:   General:  Patient in no acute distress, alert and awake  Cardiovascular: Regular rate and rhythm, systolic murmur grade 3-4  Respiratory: Clear to auscultation bilaterally no wheezes  Abdomen: Soft, obese, and nontender  Musculoskeletal: No cyanosis or clubbing   Data Reviewed: Basic Metabolic Panel:  Recent Labs Lab 09/30/13 1358 10/01/13 0133  NA 140 141  K 3.4* 3.4*  CL 101 105  CO2 25 26  GLUCOSE 195* 168*  BUN 30* 29*  CREATININE 0.78 0.90  CALCIUM 9.8 9.5   Liver Function Tests:  Recent Labs Lab 09/30/13 1358  AST 20  ALT 18  ALKPHOS 48  BILITOT 0.3  PROT 7.1  ALBUMIN 3.6    Recent Labs Lab 09/30/13 1358  LIPASE 30   No results found for this basename: AMMONIA,  in the last 168 hours CBC:  Recent Labs Lab 09/30/13 1358 10/01/13 0133  WBC 11.6* 8.9  NEUTROABS 8.5*  --   HGB 10.5* 9.4*  HCT 34.1* 30.0*  MCV 81.2 81.1  PLT 376 284   Cardiac Enzymes:  Recent Labs Lab 09/30/13 1649 09/30/13 1950 10/01/13 0133  TROPONINI <0.30 <0.30 <0.30   BNP (last 3 results) No results found for this basename: PROBNP,  in  the last 8760 hours CBG: No results found for this basename: GLUCAP,  in the last 168 hours  No results found for this or any previous visit (from the past 240 hour(s)).   Studies: Dg Chest 2 View  09/30/2013   CLINICAL DATA:  Epigastric pain, cardiac palpitations, shortness of breath with exertion fever and vomiting for 5 days  EXAM: CHEST  2 VIEW  COMPARISON:  None  FINDINGS: The heart size and mediastinal contours are within normal limits. Both lungs are clear. The visualized skeletal structures are unremarkable.  IMPRESSION: No active cardiopulmonary disease.   Electronically Signed   By: Skipper Cliche M.D.   On: 09/30/2013 17:16    Scheduled Meds: . diltiazem  240 mg  Oral Daily  . glimepiride  4 mg Oral QAC breakfast  . lisinopril  10 mg Oral Daily  . metFORMIN  1,000 mg Oral BID WC  . ondansetron  4 mg Intravenous BID  . pantoprazole (PROTONIX) IV  40 mg Intravenous Q12H  . PARoxetine  40 mg Oral q morning - 10a  . sodium chloride  3 mL Intravenous Q12H   Continuous Infusions: . sodium chloride 50 mL/hr at 10/01/13 0535     Time spent: > 35 minutes    [facility] Pager [phone]. If 7PM-7AM, please contact night-coverage at [email or web address], password [facility] 10/01/2013, 5:36 PM  LOS: 1 day

## 2013-10-01 NOTE — H&P (View-Only) (Signed)
Consultation  Referring Provider: [facility] (Dr. Charlies Silvers)     Primary Care Physician:  [name], MD Primary Gastroenterologist:  Scarlette Shorts, MD       Reason for Consultation:  GI Bleed            HPI:   Julie [name] is a 63 y.o. female admitted yesterday with epigastric pain, nausea and vomiting. She reported melenic stools. Symptoms began with nausea/vomiting/diffuse upper abdominal pain 5 days ago. Patient initially thought she ate something bad. No sick contacts. Never had this type pain before. Emesis was non-bloody. After 5 days of these symptoms patient began having very dark BMs (non bismuth). She recalls waking up several times drenched in sweat. Patient takes NSAIDS for sciatic pain. Her only other GI complaint is reflux over the last month or so. Symptoms mainly at night. She has been taking Zantac over the last month for this. She has no other GI complaints.   We saw patient in the office in 2013 for RUQ pain, felt to be musculoskeletal in nature. Screening colonoscopy was discussed at that visit. Patient wanted to think about it, she hasn't had one yet.    Past Medical History  Diagnosis Date  . Anxiety and depression   . Diabetes mellitus 06-2011    initial  CBGs ~400    . Migraines   . Hypertension 2011  . History of kidney stones   . Osteoporosis   . DJD (degenerative joint disease)   . GERD (gastroesophageal reflux disease)     Past Surgical History  Procedure Laterality Date  . Cholecystectomy  2003  . Wisdom tooth extraction      Family History  Problem Relation Age of Onset  . Cancer Mother     ovary/uterus cancer  . Hyperlipidemia Mother     M and F  . Heart disease Mother     M had CHF  . Hypertension Mother     Jerilynn Mages and F  . Diabetes Father   . Mental illness Maternal Grandmother   . Hypertension Maternal Grandmother   . Breast cancer Maternal Grandmother   . Mental illness Maternal Grandfather   . Hypertension Maternal Grandfather   .  Mental illness Paternal Grandmother   . Hypertension Paternal Grandmother   . Breast cancer Paternal Grandmother   . Mental illness Paternal Grandfather   . Hypertension Paternal Grandfather   . Stomach cancer Maternal Grandfather   . Colon cancer Neg Hx      History  Substance Use Topics  . Smoking status: Former Smoker    Types: Cigarettes    Quit date: 05/23/1979  . Smokeless tobacco: Never Used  . Alcohol Use: No     Comment: ocass    Prior to Admission medications   Medication Sig Start Date End Date Taking? Authorizing Provider  ALPRAZolam Duanne Moron) 0.25 MG tablet Take 0.5 mg by mouth at bedtime as needed for anxiety.   Yes Historical Provider, MD  aspirin 81 MG tablet Take 81 mg by mouth daily.   Yes Historical Provider, MD  diltiazem (CARDIZEM CD) 240 MG 24 hr capsule Take 1 capsule (240 mg total) by mouth daily. 07/28/13  Yes Colon Branch, MD  fenofibrate micronized (LOFIBRA) 200 MG capsule Take 1 capsule (200 mg total) by mouth daily before breakfast. 04/28/13  Yes Colon Branch, MD  glimepiride (AMARYL) 4 MG tablet Take 1 tablet (4 mg total) by mouth daily before breakfast. 07/30/13  Yes Jose  E Paz, MD  lisinopril (PRINIVIL,ZESTRIL) 10 MG tablet Take 10 mg by mouth daily.   Yes Historical Provider, MD  meloxicam (MOBIC) 7.5 MG tablet Take 1-2 tablets (7.5-15 mg total) by mouth daily as needed for pain. 07/28/13  Yes Jose E Paz, MD  metFORMIN (GLUCOPHAGE) 1000 MG tablet Take 1 tablet twice a day 07/30/13  Yes Jose E Paz, MD  PARoxetine (PAXIL) 40 MG tablet Take 40 mg by mouth every morning.   Yes Historical Provider, MD  Ranitidine HCl (ZANTAC PO) Take 1 tablet by mouth 3 (three) times daily as needed (reflux).   Yes Historical Provider, MD  zolmitriptan (ZOMIG) 5 MG tablet Take 5 mg by mouth as needed for migraine. 04/28/13  Yes Jose E Paz, MD    Current Facility-Administered Medications  Medication Dose Route Frequency Provider Last Rate Last Dose  . 0.9 %  sodium chloride infusion    Intravenous Continuous Alma M Devine, MD 50 mL/hr at 10/01/13 0535    . ALPRAZolam (XANAX) tablet 0.5 mg  0.5 mg Oral QHS PRN Alma M Devine, MD      . diltiazem (CARDIZEM CD) 24 hr capsule 240 mg  240 mg Oral Daily Alma M Devine, MD   240 mg at 10/01/13 0928  . famotidine (PEPCID) tablet 20 mg  20 mg Oral Daily Alma M Devine, MD   20 mg at 10/01/13 0928  . glimepiride (AMARYL) tablet 4 mg  4 mg Oral QAC breakfast Alma M Devine, MD   4 mg at 10/01/13 0927  . HYDROcodone-acetaminophen (NORCO/VICODIN) 5-325 MG per tablet 1-2 tablet  1-2 tablet Oral Q4H PRN Alma M Devine, MD      . lisinopril (PRINIVIL,ZESTRIL) tablet 10 mg  10 mg Oral Daily Alma M Devine, MD   10 mg at 10/01/13 0929  . meloxicam (MOBIC) tablet 7.5-15 mg  7.5-15 mg Oral Daily PRN Alma M Devine, MD      . metFORMIN (GLUCOPHAGE) tablet 1,000 mg  1,000 mg Oral BID WC Alma M Devine, MD   1,000 mg at 10/01/13 0928  . morphine 2 MG/ML injection 1 mg  1 mg Intravenous Q4H PRN Alma M Devine, MD      . ondansetron (ZOFRAN) tablet 4 mg  4 mg Oral Q6H PRN Alma M Devine, MD       Or  . ondansetron (ZOFRAN) injection 4 mg  4 mg Intravenous Q6H PRN Alma M Devine, MD      . pantoprazole (PROTONIX) injection 40 mg  40 mg Intravenous Q12H Alma M Devine, MD   40 mg at 10/01/13 0928  . PARoxetine (PAXIL) tablet 40 mg  40 mg Oral q morning - 10a Alma M Devine, MD   40 mg at 10/01/13 0928  . sodium chloride 0.9 % injection 3 mL  3 mL Intravenous Q12H Alma M Devine, MD   3 mL at 10/01/13 0929    Allergies as of 09/30/2013  . (No Known Allergies)    Review of Systems:    All systems reviewed and negative except where noted in HPI.   Physical Exam:  Vital signs in last 24 hours: Temp:  [98.1 F (36.7 C)-98.5 F (36.9 C)] 98.1 F (36.7 C) (05/13 0411) Pulse Rate:  [91-110] 91 (05/13 0411) Resp:  [18] 18 (05/13 0411) BP: (102-135)/(60-84) 102/60 mmHg (05/13 0411) SpO2:  [98 %-99 %] 99 % (05/13 0411) Weight:  [161 lb 13.1 oz (73.4 kg)-162 lb 4.1  oz (73.6 kg)] 162 lb 4.1 oz (  73.6 kg) (05/13 0411) Last BM Date:  (pta) General:   Pleasant white female in NAD Head:  Normocephalic and atraumatic. Eyes:   No icterus.   Conjunctiva pink. Ears:  Normal auditory acuity. Neck:  Supple; no masses felt Lungs:  Respirations even and unlabored. Lungs clear to auscultation bilaterally.   No wheezes, crackles, or rhonchi.  Heart:  Regular rate and rhythm Abdomen:  Soft, nondistended, nontender. Normal bowel sounds. No appreciable masses or hepatomegaly.  Rectal:  Not repeated, heme + in ED.  Msk:  Symmetrical without gross deformities.  Extremities:  Without edema. Neurologic:  Alert and  oriented x4;  grossly normal neurologically. Skin:  Intact without significant lesions or rashes. Cervical Nodes:  No significant cervical adenopathy. Psych:  Alert and cooperative. Normal affect.  LAB RESULTS:  Recent Labs  09/30/13 1358 10/01/13 0133  WBC 11.6* 8.9  HGB 10.5* 9.4*  HCT 34.1* 30.0*  PLT 376 284   BMET  Recent Labs  09/30/13 1358 10/01/13 0133  NA 140 141  K 3.4* 3.4*  CL 101 105  CO2 25 26  GLUCOSE 195* 168*  BUN 30* 29*  CREATININE 0.78 0.90  CALCIUM 9.8 9.5   LFT  Recent Labs  09/30/13 1358  PROT 7.1  ALBUMIN 3.6  AST 20  ALT 18  ALKPHOS 48  BILITOT 0.3  STUDIES: Dg Chest 2 View  09/30/2013   CLINICAL DATA:  Epigastric pain, cardiac palpitations, shortness of breath with exertion fever and vomiting for 5 days  EXAM: CHEST  2 VIEW  COMPARISON:  None  FINDINGS: The heart size and mediastinal contours are within normal limits. Both lungs are clear. The visualized skeletal structures are unremarkable.  IMPRESSION: No active cardiopulmonary disease.   Electronically Signed   By: Raymond  Rubner M.D.   On: 09/30/2013 17:16    PREVIOUS ENDOSCOPIES:            none   Impression / Plan:   1. 62 year old female with epigastric pain, nausea, vomiting and melena in setting of significant NSAID use. Hemodynamically  stable. Rule out PUD. Patient will be scheduled for EGD. The benefits, risks, and potential complications of EGD with possible biopsies were discussed with the patient and she agrees to proceed. Continue BID PPI.   2. Normocytic anemia. Hgb down about one gram from her baseline. Hopefully it will stabilize as no active bleeding today  3. GERD. Burning in throat(mainly at night) over the last month. She has been taking Zantac for symptoms  4. HTN / depression / migraines / diabetes  Thanks   LOS: 1 day   Paula M Guenther  10/01/2013, 9:34 AM    ________________________________________________________________________  [facility] MD note:  I personally examined the patient, reviewed the data and agree with the assessment and plan described above.  She takes 7-10 NSAIDs per day and has been for about a year due to back pains.  H2 blocker and tums for about 2 months. Now with melena, abd pain, nausea/vomiting.  I suspect NSAID induced PUD.  Planning on EGD today.   Christipher Rieger, MD [facility] Pager [phone]  

## 2013-10-01 NOTE — Op Note (Signed)
[facility] [place], [address]   ENDOSCOPY PROCEDURE REPORT  PATIENT: Julie [name], Julie [name]  MR#: [id number] BIRTHDATE: 08-Sep-1950 , 62  yrs. old GENDER: Female ENDOSCOPIST: Milus Banister, MD PROCEDURE DATE:  10/01/2013 PROCEDURE:  EGD w/ biopsy ASA CLASS:     Class III INDICATIONS:  melena, vomiting; extreme NSAID use for past year (7-10 pills per day for back pains). MEDICATIONS: Fentanyl 50 mcg IV and Versed 6 mg IV TOPICAL ANESTHETIC: Cetacaine Spray  DESCRIPTION OF PROCEDURE: After the risks benefits and alternatives of the procedure were thoroughly explained, informed consent was obtained.  The Pentax Gastroscope [id number] endoscope was introduced through the mouth and advanced to the second portion of the duodenum. Without limitations.  The instrument was slowly withdrawn as the mucosa was fully examined.    There was a 2cm clean based, deeply cratered, pre-pyloric ulcer. This is causing surrounding mucosal edema, significant anatomic deformity which creates partial gastric outlet obstruction.  I was able to fairly easily pass scope into the duodenum however.  The distal stomach was biopsied and sent to pathology.  There is erosive, ulcerative reflux (vomiting) related esophagitis.  The examination was otherwise normal.  Retroflexed views revealed no abnormalities.     The scope was then withdrawn from the patient and the procedure completed. COMPLICATIONS: There were no complications.  ENDOSCOPIC IMPRESSION: Prepyloric ulcer causing partial gastric outlet obstruction. NSAIDs most likely cause, biopsies taken to check for H. pylori.  RECOMMENDATIONS: She needs to completely avoid NSAIDs, ASA products.  She needs to stay on PPI twice daily for now.  With the partial gastric outlet obstruction she should probably be on full liquid diet for another 3-4 before reintroducing solid food.  Should take daily, scheduled antiemetics at least for  another 2-3 weeks (zofran 4mg  twice daily).  She is OK to d/c home as soon as she is able to tolerate full liquid diet with the above meds.  [facility] will arrange for follow up office visit in 4-5 weeks and from there will arrange repeat EGD pending her clinical course.  eSigned:  Milus Banister, MD 10/01/2013 1:40 PM   CC:  Scarlette Shorts, MD

## 2013-10-02 ENCOUNTER — Encounter (HOSPITAL_COMMUNITY): Payer: Self-pay | Admitting: Gastroenterology

## 2013-10-02 DIAGNOSIS — K259 Gastric ulcer, unspecified as acute or chronic, without hemorrhage or perforation: Secondary | ICD-10-CM

## 2013-10-02 MED ORDER — PANTOPRAZOLE SODIUM 40 MG PO TBEC
40.0000 mg | DELAYED_RELEASE_TABLET | Freq: Two times a day (BID) | ORAL | Status: DC
  Administered 2013-10-02: 40 mg via ORAL
  Filled 2013-10-02: qty 1

## 2013-10-02 MED ORDER — PANTOPRAZOLE SODIUM 40 MG PO TBEC: 40.0000 mg | DELAYED_RELEASE_TABLET | Freq: Two times a day (BID) | ORAL | Status: DC

## 2013-10-02 MED ORDER — ONDANSETRON 4 MG PO TBDP: 4.0000 mg | ORAL_TABLET | Freq: Two times a day (BID) | ORAL | Status: AC

## 2013-10-02 MED ORDER — HYDROCODONE-ACETAMINOPHEN 5-325 MG PO TABS: 1.0000 | ORAL_TABLET | ORAL | Status: DC | PRN

## 2013-10-02 NOTE — Progress Notes (Signed)
RN entered room and found pt eating Cheez-its last night. RN explained importance of eating a clear liquid diet. Pt verbalized understanding. Pt did not complain of nausea or vomiting throughout the night. Report given to H. Holderness, Therapist, sports.  Helayne [place]

## 2013-10-02 NOTE — Discharge Summary (Signed)
Physician Discharge Summary  Julie [name] VZD:[id number] DOB: [birth year] DOA: 09/30/2013  PCP: Julie November, MD  Admit date: 09/30/2013 Discharge date: 10/02/2013  Time spent: >35 minutes  Recommendations for Outpatient Follow-up:  1. Please be sure to followup with your primary care physician 2. Also followup with your gastroenterologist in 3-4 weeks. If you do not hear from the office please be sure to contact them for appointment details  Discharge Diagnoses:  Principal Problem:   GI bleeding Active Problems:   Diabetes mellitus   HTN (hypertension)   Hyperlipidemia   Anxiety and depression   Acute blood loss anemia   Leukocytosis   Hypokalemia   Nausea & vomiting   Palpitations   Gastric ulcer   Discharge Condition: Stable  Diet recommendation: Full liquid diet for the next 3-4 days then soft diet/diabetic diet  Filed Weights   09/30/13 2029 10/01/13 0411 10/02/13 0440  Weight: 73.4 kg (161 lb 13.1 oz) 73.6 kg (162 lb 4.1 oz) 75.3 kg (166 lb 0.1 oz)    History of present illness:  Pt is 63 yo female who presents to [facility] ED with main concern of several days duration of progressively worsening epigastric pain, intermittent and throbbing in nature, associated with nausea and non bloody vomiting for past 5 days prior to this admission.   Hospital Course:  GI bleeding/gastric ulcer  - GI on board and has recommended the following:  She needs to completely avoid NSAIDs, ASA products. She needs to  stay on PPI twice daily for now. With the partial gastric outlet  obstruction she should probably be on full liquid diet for another  3-4 days before reintroducing solid food. Should take daily, scheduled  antiemetics at least for another 2-3 weeks (zofran 4mg  twice  daily). She is OK to d/c home as soon as she is able to tolerate  full liquid diet with the above meds. [name] will arrange for  follow up office visit in 4-5 weeks and from there will arrange  repeat EGD pending  her clinical course.  -On day of discharge patient tolerated diet as well as oral medication  Active Problems:  Diabetes mellitus  - Pt on Metformin and glimepiride - Patient to continue diabetic diet  HTN (hypertension)  - Pt currently on lisinopril and cardizem  - Blood pressures relatively well controlled on this regimen.   Anxiety and depression  - stable continue paxil   Acute blood loss anemia  - stable blood loss most likely 2ary to principle problem listed above.  - Hemoglobin steady at 9.4 on last check  Leukocytosis  - Resolved, most likely stress mediated   Hypokalemia  - Mild, suspect secondary to poor oral intake.  - Replace orally prior to discharge  Nausea & vomiting  - Resolved   Procedures:  *EGD  Consultations:  Gastroenterologist Dr. Ardis Hughs  Discharge Exam: Filed Vitals:   10/02/13 0440  BP: 102/56  Pulse: 74  Temp: 98 F (36.7 C)  Resp: 18    General: Patient in no acute distress, alert and Cardiovascular: Regular rate and rhythm, no murmurs or rubs Respiratory: Clear to auscultation bilaterally no wheezes  Discharge Instructions You were cared for by a hospitalist during your hospital stay. If you have any questions about your discharge medications or the care you received while you were in the hospital after you are discharged, you can call the unit and asked to speak with the hospitalist on call if the hospitalist that took care of you  is not available. Once you are discharged, your primary care physician will handle any further medical issues. Please note that NO REFILLS for any discharge medications will be authorized once you are discharged, as it is imperative that you return to your primary care physician (or establish a relationship with a primary care physician if you do not have one) for your aftercare needs so that they can reassess your need for medications and monitor your lab values.      Discharge Orders   Future  Appointments Provider Department Dept Phone   11/13/2013 9:15 AM [name], MD [facility] [phone]   11/27/2013 9:30 AM Colon Branch, MD [name]   Future Orders Complete By Expires   Call MD for:  persistant nausea and vomiting  As directed    Call MD for:  severe uncontrolled pain  As directed    Call MD for:  temperature >100.4  As directed    Diet - low sodium heart healthy  As directed    Increase activity slowly  As directed        Medication List    STOP taking these medications       aspirin 81 MG tablet     meloxicam 7.5 MG tablet  Commonly known as:  MOBIC     ZANTAC PO      TAKE these medications       ALPRAZolam 0.25 MG tablet  Commonly known as:  XANAX  Take 0.5 mg by mouth at bedtime as needed for anxiety.     diltiazem 240 MG 24 hr capsule  Commonly known as:  CARDIZEM CD  Take 1 capsule (240 mg total) by mouth daily.     fenofibrate micronized 200 MG capsule  Commonly known as:  LOFIBRA  Take 1 capsule (200 mg total) by mouth daily before breakfast.     glimepiride 4 MG tablet  Commonly known as:  AMARYL  Take 1 tablet (4 mg total) by mouth daily before breakfast.     HYDROcodone-acetaminophen 5-325 MG per tablet  Commonly known as:  NORCO/VICODIN  Take 1 tablet by mouth every 4 (four) hours as needed for moderate pain.     lisinopril 10 MG tablet  Commonly known as:  PRINIVIL,ZESTRIL  Take 10 mg by mouth daily.     metFORMIN 1000 MG tablet  Commonly known as:  GLUCOPHAGE  Take 1 tablet twice a day     ondansetron 4 MG disintegrating tablet  Commonly known as:  ZOFRAN ODT  Take 1 tablet (4 mg total) by mouth 2 (two) times daily.     pantoprazole 40 MG tablet  Commonly known as:  PROTONIX  Take 1 tablet (40 mg total) by mouth 2 (two) times daily before a meal.     PARoxetine 40 MG tablet  Commonly known as:  PAXIL  Take 40 mg by mouth every morning.      zolmitriptan 5 MG tablet  Commonly known as:  ZOMIG  Take 5 mg by mouth as needed for migraine.       No Known Allergies    The results of significant diagnostics from this hospitalization (including imaging, microbiology, ancillary and laboratory) are listed below for reference.    Significant Diagnostic Studies: Dg Chest 2 View  09/30/2013   CLINICAL DATA:  Epigastric pain, cardiac palpitations, shortness of breath with exertion fever and vomiting for 5 days  EXAM: CHEST  2 VIEW  COMPARISON:  None  FINDINGS: The heart size and mediastinal contours are within normal limits. Both lungs are clear. The visualized skeletal structures are unremarkable.  IMPRESSION: No active cardiopulmonary disease.   Electronically Signed   By: Skipper Cliche M.D.   On: 09/30/2013 17:16    Microbiology: No results found for this or any previous visit (from the past 240 hour(s)).   Labs: Basic Metabolic Panel:  Recent Labs Lab 09/30/13 1358 10/01/13 0133  NA 140 141  K 3.4* 3.4*  CL 101 105  CO2 25 26  GLUCOSE 195* 168*  BUN 30* 29*  CREATININE 0.78 0.90  CALCIUM 9.8 9.5   Liver Function Tests:  Recent Labs Lab 09/30/13 1358  AST 20  ALT 18  ALKPHOS 48  BILITOT 0.3  PROT 7.1  ALBUMIN 3.6    Recent Labs Lab 09/30/13 1358  LIPASE 30   No results found for this basename: AMMONIA,  in the last 168 hours CBC:  Recent Labs Lab 09/30/13 1358 10/01/13 0133  WBC 11.6* 8.9  NEUTROABS 8.5*  --   HGB 10.5* 9.4*  HCT 34.1* 30.0*  MCV 81.2 81.1  PLT 376 284   Cardiac Enzymes:  Recent Labs Lab 09/30/13 1649 09/30/13 1950 10/01/13 0133  TROPONINI <0.30 <0.30 <0.30   BNP: BNP (last 3 results) No results found for this basename: PROBNP,  in the last 8760 hours CBG: No results found for this basename: GLUCAP,  in the last 168 hours     Signed:  Velvet Bathe  [facility] 10/02/2013, 12:22 PM

## 2013-10-02 NOTE — Progress Notes (Signed)
[facility] Progress Note    Since last GI note: EGD yesterday, see full report in chart. She tolerated clears overnight.  No BMs, no vomiting, abd pain much improved overall.  Objective: Vital signs in last 24 hours: Temp:  [97.6 F (36.4 C)-98.6 F (37 C)] 98 F (36.7 C) (05/14 0440) Pulse Rate:  [74-98] 74 (05/14 0440) Resp:  [10-24] 18 (05/14 0440) BP: (102-152)/(56-75) 102/56 mmHg (05/14 0440) SpO2:  [96 %-100 %] 98 % (05/14 0440) Weight:  [166 lb 0.1 oz (75.3 kg)] 166 lb 0.1 oz (75.3 kg) (05/14 0440) Last BM Date:  (pta) General: alert and oriented times 3 Heart: regular rate and rythm Abdomen: soft, non-tender, non-distended, normal bowel sounds   Lab Results:  Recent Labs  09/30/13 1358 10/01/13 0133  WBC 11.6* 8.9  HGB 10.5* 9.4*  PLT 376 284  MCV 81.2 81.1    Recent Labs  09/30/13 1358 10/01/13 0133  NA 140 141  K 3.4* 3.4*  CL 101 105  CO2 25 26  GLUCOSE 195* 168*  BUN 30* 29*  CREATININE 0.78 0.90  CALCIUM 9.8 9.5    Recent Labs  09/30/13 1358  PROT 7.1  ALBUMIN 3.6  AST 20  ALT 18  ALKPHOS 48  BILITOT 0.3    Medications: Scheduled Meds: . diltiazem  240 mg Oral Daily  . glimepiride  4 mg Oral QAC breakfast  . lisinopril  10 mg Oral Daily  . metFORMIN  1,000 mg Oral BID WC  . ondansetron  4 mg Intravenous BID  . pantoprazole (PROTONIX) IV  40 mg Intravenous Q12H  . PARoxetine  40 mg Oral q morning - 10a  . sodium chloride  3 mL Intravenous Q12H   Continuous Infusions: . sodium chloride 50 mL/hr at 10/01/13 2213   PRN Meds:.ALPRAZolam, HYDROcodone-acetaminophen, morphine injection, promethazine    Assessment/Plan: 63 y.o. female with large prepyloric gastric ulcer (likely NSAID related)  She needs to completely avoid NSAIDs, ASA products. She needs to stay on PPI twice daily for now. With the partial gastric outlet obstruction she should probably be on full liquid diet for another 3-4 before reintroducing solid  food. Should take daily, scheduled antiemetics at least for another 2-3 weeks (zofran 4mg  twice daily). She is OK to d/c home as soon as she is able to tolerate full liquid diet with the above meds. [facility] will arrange for follow up office visit in 4-5 weeks and from there will arrange repeat EGD pending her clinical course.  I will increase diet to full liquids this AM and change to PO PPI.  If she tolerates, she can probably go home later this afternoon with the above rec's.   Milus Banister, MD  10/02/2013, 7:48 AM [facility] Pager [phone]

## 2013-10-03 NOTE — Telephone Encounter (Signed)
error 

## 2013-10-06 ENCOUNTER — Telehealth: Payer: Self-pay | Admitting: Internal Medicine

## 2013-10-06 NOTE — Telephone Encounter (Signed)
Was recently at the hospital,was recommended to followup with me, please arrange

## 2013-10-07 NOTE — Telephone Encounter (Signed)
Called again to schedule hospital follow up, left message to return our call.

## 2013-10-07 NOTE — Telephone Encounter (Signed)
Left message on voice mail for pt to make appt for hospital follow up.

## 2013-10-08 ENCOUNTER — Encounter: Payer: Self-pay | Admitting: *Deleted

## 2013-10-08 NOTE — Telephone Encounter (Signed)
Letter sent to patient to schedule hospital follow up.

## 2013-10-08 NOTE — Telephone Encounter (Signed)
Await patient call back.

## 2013-11-13 ENCOUNTER — Ambulatory Visit: Payer: Self-pay | Admitting: Internal Medicine

## 2013-11-27 ENCOUNTER — Ambulatory Visit: Payer: Self-pay | Admitting: Internal Medicine

## 2014-01-03 ENCOUNTER — Other Ambulatory Visit: Payer: Self-pay | Admitting: Internal Medicine

## 2014-01-26 ENCOUNTER — Other Ambulatory Visit: Payer: Self-pay | Admitting: Internal Medicine

## 2014-01-27 ENCOUNTER — Telehealth: Payer: Self-pay

## 2014-01-27 MED ORDER — ALPRAZOLAM 0.25 MG PO TABS
0.5000 mg | ORAL_TABLET | Freq: Every evening | ORAL | Status: DC | PRN
Start: 2014-01-27 — End: 2014-03-18

## 2014-01-27 NOTE — Telephone Encounter (Signed)
Pt is requesting refill for Alprazolam  Last OV: 07/28/2013 Last Fill: 07/23/2013 Last UDS: 08/12/2013 Low Risk    Please advise.

## 2014-01-27 NOTE — Telephone Encounter (Signed)
Medication faxed to Pharmacy, [facility] for Pt to return call and make appt for any further refills.

## 2014-01-27 NOTE — Telephone Encounter (Signed)
Please call the patient, I will RF one-month supply, no further refills without office visit --->  please be sure she understands

## 2014-03-18 ENCOUNTER — Other Ambulatory Visit: Payer: Self-pay

## 2014-03-18 ENCOUNTER — Telehealth: Payer: Self-pay

## 2014-03-18 MED ORDER — ALPRAZOLAM 0.25 MG PO TABS: 0.5000 mg | ORAL_TABLET | Freq: Every evening | ORAL | Status: DC | PRN

## 2014-03-18 MED ORDER — PAROXETINE HCL 40 MG PO TABS: 40.0000 mg | ORAL_TABLET | Freq: Every morning | ORAL | Status: DC

## 2014-03-18 MED ORDER — GLIMEPIRIDE 4 MG PO TABS: 4.0000 mg | ORAL_TABLET | Freq: Every day | ORAL | Status: DC

## 2014-03-18 NOTE — Telephone Encounter (Signed)
Faxed to [facility].  

## 2014-03-18 NOTE — Telephone Encounter (Signed)
done

## 2014-03-18 NOTE — Telephone Encounter (Signed)
Pt is requesting refill on Xanax  Last OV: 07/28/2013, appt scheduled for 03/23/2014 @ 1:15 Last Fill: 07/28/2013 # 40 0RF UDS: 07/28/2013 Low risk  Please advise.

## 2014-03-23 ENCOUNTER — Ambulatory Visit (INDEPENDENT_AMBULATORY_CARE_PROVIDER_SITE_OTHER): Payer: Self-pay | Admitting: Internal Medicine

## 2014-03-23 ENCOUNTER — Ambulatory Visit (INDEPENDENT_AMBULATORY_CARE_PROVIDER_SITE_OTHER): Payer: MEDICAID

## 2014-03-23 ENCOUNTER — Encounter (HOSPITAL_COMMUNITY): Payer: Self-pay | Admitting: Gastroenterology

## 2014-03-23 VITALS — BP 170/94 | HR 97 | Temp 98.2°F | Wt 168.5 lb

## 2014-03-23 DIAGNOSIS — E119 Type 2 diabetes mellitus without complications: Secondary | ICD-10-CM

## 2014-03-23 DIAGNOSIS — K253 Acute gastric ulcer without hemorrhage or perforation: Secondary | ICD-10-CM

## 2014-03-23 DIAGNOSIS — Z23 Encounter for immunization: Secondary | ICD-10-CM

## 2014-03-23 DIAGNOSIS — I10 Essential (primary) hypertension: Secondary | ICD-10-CM

## 2014-03-23 NOTE — Assessment & Plan Note (Signed)
Well-controlled? For now continue with lisinopril and Cardizem, continue monitoring BPs. Check a CMP

## 2014-03-23 NOTE — Assessment & Plan Note (Addendum)
Admitted 09/2013 w/ a  prepyloric ulcer, on PPIs twice a day, now asymptomatic, did not keep appointment with GI. Plan: Decrease PPIs to once a day, check a CBC, we'll ask GI if they still need to see her. Addendum, I communicated with GI, they do need to see her, will arrange

## 2014-03-23 NOTE — Progress Notes (Signed)
Pre visit review using our clinic review tool, if applicable. No additional management support is needed unless otherwise documented below in the visit note. 

## 2014-03-23 NOTE — Patient Instructions (Addendum)
Get your blood work before you leave     Check the  blood pressure 2 or 3 times a  Week  Be sure your blood pressure is between  145/85  and 110/65.  if it is consistently higher or lower, let me know    Check your blood sugar 2 times a day at different times (fasting and after a meal) GOALS: Fasting before a meal 70- 130 2 hours after a meal less than 180 At bedtime 90-150 Call with the readings in one week  Please come back to the office in 3 months  for a routine check up

## 2014-03-23 NOTE — Progress Notes (Signed)
Subjective:    Patient ID: Julie [name], female    DOB: August 07, 1950, 63 y.o.   MRN: [id number]  DOS:  03/23/2014 Type of visit - description : rov Interval history: Admitted to the hospital 09/2013 with a GI bleed, EGD showing a prepyloric ulcer with partial gastric outlet obstructions. Saw Dr. Ardis Hughs. Good compliance with PPIs Her main concern today however his elevated blood sugar, few days ago it went up to 500, she checked twice.  Since then CBGs running in  the 200s, 300 range. His taking the same medications and has no change her diet. Hypertension, ambulatory BPs 130/80 usually.     ROS Denies nausea, vomiting, diarrhea. No blood in the stool, no upper or lower abdominal pain, no postprandial symptoms. Mild polyuria? No polyphagia. Weight stable per our scales No headaches, was nauseated one day when her blood sugar was elevated  Past Medical History  Diagnosis Date  . Anxiety and depression   . Diabetes mellitus 06-2011    initial  CBGs ~400    . Migraines   . Hypertension 2011  . History of kidney stones   . Osteoporosis   . DJD (degenerative joint disease)   . GERD (gastroesophageal reflux disease)     Past Surgical History  Procedure Laterality Date  . Cholecystectomy  2003  . Wisdom tooth extraction    . Esophagogastroduodenoscopy N/A 10/01/2013    Procedure: ESOPHAGOGASTRODUODENOSCOPY (EGD);  Surgeon: Milus Banister, MD;  Location: [facility];  Service: Endoscopy;  Laterality: N/A;    History   Social History  . Marital Status: Married    Spouse Name: tema alire    Number of Children: 1  . Years of Education: N/A   Occupational History  . not working     Social History Main Topics  . Smoking status: Former Smoker    Types: Cigarettes    Quit date: 05/23/1979  . Smokeless tobacco: Never Used  . Alcohol Use: No     Comment: ocass  . Drug Use: No  . Sexual Activity: No   Other Topics Concern  . Not on file   Social History Narrative   Husband had a stroke        Medication List       This list is accurate as of: 03/23/14 11:59 PM.  Always use your most recent med list.               ALPRAZolam 0.25 MG tablet  Commonly known as:  XANAX  Take 2 tablets (0.5 mg total) by mouth at bedtime as needed for anxiety.     diltiazem 240 MG 24 hr capsule  Commonly known as:  CARDIZEM CD  Take 1 capsule (240 mg total) by mouth daily.     fenofibrate micronized 200 MG capsule  Commonly known as:  LOFIBRA  TAKE ONE CAPSULE BY MOUTH ONCE DAILY BEFORE BREAKFAST     glimepiride 4 MG tablet  Commonly known as:  AMARYL  Take 1 tablet (4 mg total) by mouth daily before breakfast.     lisinopril 10 MG tablet  Commonly known as:  PRINIVIL,ZESTRIL  Take 10 mg by mouth daily.     metFORMIN 1000 MG tablet  Commonly known as:  GLUCOPHAGE  Take 1 tablet twice a day     pantoprazole 40 MG tablet  Commonly known as:  PROTONIX  Take 1 tablet (40 mg total) by mouth 2 (two) times daily before a meal.  PARoxetine 40 MG tablet  Commonly known as:  PAXIL  Take 1 tablet (40 mg total) by mouth every morning.     zolmitriptan 5 MG tablet  Commonly known as:  ZOMIG  Take 5 mg by mouth as needed for migraine.           Objective:   Physical Exam BP 170/94 mmHg  Pulse 97  Temp(Src) 98.2 F (36.8 C) (Oral)  Wt 168 lb 8 oz (76.431 kg)  SpO2 97% General -- alert, well-developed, NAD.   HEENT-- Not pale.   Lungs -- normal respiratory effort, no intercostal retractions, no accessory muscle use, and normal breath sounds.  Heart-- normal rate, regular rhythm, no murmur.  Abdomen-- Not distended, good bowel sounds,soft, non-tender.  Extremities-- no pretibial edema bilaterally  Neurologic--  alert & oriented X3. Speech normal, gait appropriate for age, strength symmetric and appropriate for age.  Psych-- Cognition and judgment appear intact. Cooperative with normal attention span and concentration. No anxious or depressed  appearing.        Assessment & Plan:

## 2014-03-23 NOTE — Assessment & Plan Note (Addendum)
Last A1c few months ago satisfactory however in the last 3 weeks her CBG has been as high as 500 and otherwise around 200, 300. No change in diet, no change in medicines. Plan:  Check CBGs twice a day pre-and postprandially. A1c, UA  Further advice w/ results

## 2014-03-24 LAB — URINALYSIS, ROUTINE W REFLEX MICROSCOPIC
Bilirubin Urine: NEGATIVE
Hgb urine dipstick: NEGATIVE
Ketones, ur: NEGATIVE
Leukocytes, UA: NEGATIVE
Nitrite: NEGATIVE
PH: 5.5 (ref 5.0–8.0)
RBC / HPF: NONE SEEN (ref 0–?)
SPECIFIC GRAVITY, URINE: 1.025 (ref 1.000–1.030)
Total Protein, Urine: NEGATIVE
URINE GLUCOSE: NEGATIVE
Urobilinogen, UA: 0.2 (ref 0.0–1.0)

## 2014-03-24 LAB — CBC WITH DIFFERENTIAL/PLATELET
BASOS ABS: 0 10*3/uL (ref 0.0–0.1)
BASOS PCT: 0.5 % (ref 0.0–3.0)
EOS PCT: 2.9 % (ref 0.0–5.0)
Eosinophils Absolute: 0.2 10*3/uL (ref 0.0–0.7)
HEMATOCRIT: 31.1 % — AB (ref 36.0–46.0)
Hemoglobin: 9.5 g/dL — ABNORMAL LOW (ref 12.0–15.0)
LYMPHS ABS: 1.9 10*3/uL (ref 0.7–4.0)
Lymphocytes Relative: 23.3 % (ref 12.0–46.0)
MCHC: 30.6 g/dL (ref 30.0–36.0)
MCV: 74.8 fl — ABNORMAL LOW (ref 78.0–100.0)
MONOS PCT: 3.1 % (ref 3.0–12.0)
Monocytes Absolute: 0.3 10*3/uL (ref 0.1–1.0)
NEUTROS ABS: 5.7 10*3/uL (ref 1.4–7.7)
Neutrophils Relative %: 70.2 % (ref 43.0–77.0)
Platelets: 314 10*3/uL (ref 150.0–400.0)
RBC: 4.15 Mil/uL (ref 3.87–5.11)
RDW: 16.5 % — AB (ref 11.5–15.5)
WBC: 8.1 10*3/uL (ref 4.0–10.5)

## 2014-03-24 LAB — COMPREHENSIVE METABOLIC PANEL
ALT: 17 U/L (ref 0–35)
AST: 18 U/L (ref 0–37)
Albumin: 3.4 g/dL — ABNORMAL LOW (ref 3.5–5.2)
Alkaline Phosphatase: 41 U/L (ref 39–117)
BUN: 19 mg/dL (ref 6–23)
CO2: 27 mEq/L (ref 19–32)
CREATININE: 0.8 mg/dL (ref 0.4–1.2)
Calcium: 9.4 mg/dL (ref 8.4–10.5)
Chloride: 107 mEq/L (ref 96–112)
GFR: 83.37 mL/min (ref >60.00)
Glucose, Bld: 134 mg/dL — ABNORMAL HIGH (ref 70–99)
Potassium: 3.6 mEq/L (ref 3.5–5.1)
Sodium: 140 mEq/L (ref 135–145)
Total Bilirubin: 0.4 mg/dL (ref 0.2–1.2)
Total Protein: 7 g/dL (ref 6.0–8.3)

## 2014-03-24 LAB — HEMOGLOBIN A1C: Hgb A1c MFr Bld: 8.5 % — ABNORMAL HIGH (ref 4.6–6.5)

## 2014-03-26 NOTE — Addendum Note (Signed)
Addended by: [name] E on: 03/26/2014 01:52 PM   Modules accepted: Miquel Dunn

## 2014-04-08 ENCOUNTER — Other Ambulatory Visit: Payer: Self-pay

## 2014-04-08 MED ORDER — LISINOPRIL 10 MG PO TABS
10.0000 mg | ORAL_TABLET | Freq: Every day | ORAL | Status: DC
Start: 2014-04-08 — End: 2014-07-01

## 2014-04-21 ENCOUNTER — Other Ambulatory Visit: Payer: Self-pay

## 2014-04-21 MED ORDER — METFORMIN HCL 1000 MG PO TABS: ORAL_TABLET | ORAL | Status: DC

## 2014-04-29 ENCOUNTER — Other Ambulatory Visit: Payer: Self-pay

## 2014-04-29 MED ORDER — DILTIAZEM HCL ER COATED BEADS 240 MG PO CP24: 240.0000 mg | ORAL_CAPSULE | Freq: Every day | ORAL | Status: DC

## 2014-05-11 ENCOUNTER — Telehealth: Payer: Self-pay | Admitting: Internal Medicine

## 2014-05-11 ENCOUNTER — Other Ambulatory Visit: Payer: Self-pay

## 2014-05-11 MED ORDER — GLIMEPIRIDE 4 MG PO TABS: 4.0000 mg | ORAL_TABLET | Freq: Every day | ORAL | Status: DC

## 2014-05-11 MED ORDER — ALPRAZOLAM 0.25 MG PO TABS
0.5000 mg | ORAL_TABLET | Freq: Every evening | ORAL | Status: DC | PRN
Start: 2014-05-11 — End: 2014-07-01

## 2014-05-11 NOTE — Telephone Encounter (Signed)
Pt is requesting refill on Alprazolam.  Last OV: 03/23/2014 Last Fill:03/15/2014 # 60 0RF UDS: 07/28/2013 Low risk  Okay to refill?  Glimepiride refilled to [facility] with # 30 and 3 RFs. Diltiazem was refilled on 04/29/2014 to [facility] # 90 and 1 RF.

## 2014-05-11 NOTE — Telephone Encounter (Signed)
rx printed

## 2014-05-11 NOTE — Telephone Encounter (Signed)
Faxed to [facility].  

## 2014-05-11 NOTE — Telephone Encounter (Signed)
Caller name: Lindaann, Gradilla Relation to pt: self  Call back number: [phone] Pharmacy: [facility] [phone] (please send [facility] [place] [place] - [address])   Reason for call: requesting a refill  diltiazem (CARDIZEM CD) 240 MG 24 hr capsule  ALPRAZolam (XANAX) 0.25 MG tablet  glimepiride (AMARYL) 4 MG tablet Pt is completely out of the above medication, pt states MD was suppose to call in all medications

## 2014-05-26 ENCOUNTER — Other Ambulatory Visit: Payer: Self-pay

## 2014-05-26 MED ORDER — FENOFIBRATE MICRONIZED 200 MG PO CAPS: ORAL_CAPSULE | ORAL | Status: DC

## 2014-06-23 ENCOUNTER — Ambulatory Visit: Payer: Self-pay | Admitting: Internal Medicine

## 2014-07-01 ENCOUNTER — Ambulatory Visit (INDEPENDENT_AMBULATORY_CARE_PROVIDER_SITE_OTHER): Payer: Self-pay | Admitting: Internal Medicine

## 2014-07-01 ENCOUNTER — Encounter: Payer: Self-pay | Admitting: Internal Medicine

## 2014-07-01 VITALS — BP 126/72 | HR 95 | Temp 98.3°F | Ht 66.0 in | Wt 163.2 lb

## 2014-07-01 DIAGNOSIS — I10 Essential (primary) hypertension: Secondary | ICD-10-CM

## 2014-07-01 DIAGNOSIS — F419 Anxiety disorder, unspecified: Principal | ICD-10-CM

## 2014-07-01 DIAGNOSIS — F418 Other specified anxiety disorders: Secondary | ICD-10-CM

## 2014-07-01 DIAGNOSIS — K253 Acute gastric ulcer without hemorrhage or perforation: Secondary | ICD-10-CM

## 2014-07-01 DIAGNOSIS — E119 Type 2 diabetes mellitus without complications: Secondary | ICD-10-CM

## 2014-07-01 DIAGNOSIS — E785 Hyperlipidemia, unspecified: Secondary | ICD-10-CM

## 2014-07-01 DIAGNOSIS — F32A Depression, unspecified: Secondary | ICD-10-CM

## 2014-07-01 DIAGNOSIS — F329 Major depressive disorder, single episode, unspecified: Secondary | ICD-10-CM

## 2014-07-01 MED ORDER — METFORMIN HCL 1000 MG PO TABS: ORAL_TABLET | ORAL | Status: DC

## 2014-07-01 MED ORDER — DILTIAZEM HCL ER COATED BEADS 240 MG PO CP24: 240.0000 mg | ORAL_CAPSULE | Freq: Every day | ORAL | Status: DC

## 2014-07-01 MED ORDER — LISINOPRIL 10 MG PO TABS: 10.0000 mg | ORAL_TABLET | Freq: Every day | ORAL | Status: DC

## 2014-07-01 MED ORDER — GLIMEPIRIDE 4 MG PO TABS: 4.0000 mg | ORAL_TABLET | Freq: Every day | ORAL | Status: DC

## 2014-07-01 MED ORDER — PAROXETINE HCL 40 MG PO TABS: 40.0000 mg | ORAL_TABLET | Freq: Every morning | ORAL | Status: DC

## 2014-07-01 MED ORDER — FENOFIBRATE MICRONIZED 200 MG PO CAPS: ORAL_CAPSULE | ORAL | Status: DC

## 2014-07-01 MED ORDER — ALPRAZOLAM 0.25 MG PO TABS: 0.5000 mg | ORAL_TABLET | Freq: Every evening | ORAL | Status: DC | PRN

## 2014-07-01 NOTE — Assessment & Plan Note (Signed)
Check a FLP today, apparently taking fenofibrate correctly

## 2014-07-01 NOTE — Progress Notes (Signed)
Pre visit review using our clinic review tool, if applicable. No additional management support is needed unless otherwise documented below in the visit note. 

## 2014-07-01 NOTE — Assessment & Plan Note (Signed)
BP today 126/72, no change

## 2014-07-01 NOTE — Patient Instructions (Signed)
Get your blood work before you leave  You also need a UDS   Please come back to the office  In 6 weeks for a routine check up  No  fasting

## 2014-07-01 NOTE — Assessment & Plan Note (Signed)
Out of medications for several days, will refill medications, recommend good compliance, come back in 6 weeks

## 2014-07-01 NOTE — Assessment & Plan Note (Signed)
Refill Xanax and Paxil, check a UDS

## 2014-07-01 NOTE — Assessment & Plan Note (Addendum)
She ran out of PPIs and did not go to see GI. Apparently has been taking meloxicam, strongly recommend not to take meloxicam or any other NSAID Plan: CBC to monitor anemia, refer to GI

## 2014-07-01 NOTE — Progress Notes (Signed)
Subjective:    Patient ID: Julie [name], female    DOB: 1950-07-27, 64 y.o.   MRN: [id number]  DOS:  07/01/2014 Type of visit - description : rov Interval history: Run out of several medications about a week ago including metformin, glimepiride, Paxil. Also, she thinks she has been taking meloxicam as needed and also Excedrin for headaches. History of peptic ulcer disease, not taking PPIs. Ambulatory CBGs: "All over the place". In the morning they range from 130 to 289, at night from 120 -256  Review of Systems Denies chest pain or difficulty breathing. No palpitations No nausea, vomiting, diarrhea blood in the stools. No abdominal pain.  Past Medical History  Diagnosis Date  . Anxiety and depression   . Diabetes mellitus 06-2011    initial  CBGs ~400    . Migraines   . Hypertension 2011  . History of kidney stones   . Osteoporosis   . DJD (degenerative joint disease)   . GERD (gastroesophageal reflux disease)     Past Surgical History  Procedure Laterality Date  . Cholecystectomy  2003  . Wisdom tooth extraction    . Esophagogastroduodenoscopy N/A 10/01/2013    Procedure: ESOPHAGOGASTRODUODENOSCOPY (EGD);  Surgeon: Milus Banister, MD;  Location: [facility];  Service: Endoscopy;  Laterality: N/A;    History   Social History  . Marital Status: Married    Spouse Name: layaan mott  . Number of Children: 1  . Years of Education: N/A   Occupational History  . not working     Social History Main Topics  . Smoking status: Former Smoker    Types: Cigarettes    Quit date: 05/23/1979  . Smokeless tobacco: Never Used  . Alcohol Use: No     Comment: ocass  . Drug Use: No  . Sexual Activity: No   Other Topics Concern  . Not on file   Social History Narrative   Husband had a stroke        Medication List       This list is accurate as of: 07/01/14 11:59 PM.  Always use your most recent med list.               ALPRAZolam 0.25 MG tablet  Commonly known  as:  XANAX  Take 2 tablets (0.5 mg total) by mouth at bedtime as needed for anxiety.     diltiazem 240 MG 24 hr capsule  Commonly known as:  CARDIZEM CD  Take 1 capsule (240 mg total) by mouth daily.     fenofibrate micronized 200 MG capsule  Commonly known as:  LOFIBRA  TAKE ONE CAPSULE BY MOUTH ONCE DAILY BEFORE BREAKFAST     glimepiride 4 MG tablet  Commonly known as:  AMARYL  Take 1 tablet (4 mg total) by mouth daily before breakfast.     lisinopril 10 MG tablet  Commonly known as:  PRINIVIL,ZESTRIL  Take 1 tablet (10 mg total) by mouth daily.     metFORMIN 1000 MG tablet  Commonly known as:  GLUCOPHAGE  Take 1 tablet twice a day     PARoxetine 40 MG tablet  Commonly known as:  PAXIL  Take 1 tablet (40 mg total) by mouth every morning.     zolmitriptan 5 MG tablet  Commonly known as:  ZOMIG  Take 5 mg by mouth as needed for migraine.           Objective:   Physical Exam  Constitutional: She is oriented  to person, place, and time. She appears well-developed. No distress.  HENT:  Head: Normocephalic and atraumatic.  Cardiovascular:  RRR, no murmur, rub or gallop  Pulmonary/Chest: Effort normal. No respiratory distress.  CTA B  Musculoskeletal: She exhibits no edema or tenderness.  Diabetic foot exam: Good pedal pulses, skin normal, pinprick examination normal  Neurological: She is alert and oriented to person, place, and time. She exhibits normal muscle tone. Coordination normal.  Speech normal, gait unassisted and normal for age, motor strength appropriate for age   Skin: Skin is warm and dry. No pallor.  No jaundice  Psychiatric: She has a normal mood and affect. Her behavior is normal. Judgment and thought content normal.  Vitals reviewed. BP 126/72 mmHg  Pulse 95  Temp(Src) 98.3 F (36.8 C) (Oral)  Ht 5\' 6"  (1.676 m)  Wt 163 lb 4 oz (74.05 kg)  BMI 26.36 kg/m2  SpO2 98%        Assessment & Plan:   Problem List Items Addressed This Visit       Cardiovascular and Mediastinum   HTN (hypertension)    BP today 126/72, no change      Relevant Medications   fenofibrate micronized (LOFIBRA) capsule   lisinopril (PRINIVIL,ZESTRIL) tablet   diltiazem (CARDIZEM CD) 24 hr capsule     Digestive   Gastric ulcer - Primary    She ran out of PPIs and did not go to see GI. Apparently has been taking meloxicam, strongly recommend not to take meloxicam or any other NSAID Plan: CBC to monitor anemia, refer to GI      Relevant Orders   CBC with Differential/Platelet (Completed)   Iron (Completed)   Ferritin (Completed)     Endocrine   Diabetes mellitus    Out of medications for several days, will refill medications, recommend good compliance, come back in 6 weeks      Relevant Medications   glimepiride (AMARYL) tablet   metFORMIN (GLUCOPHAGE) tablet   lisinopril (PRINIVIL,ZESTRIL) tablet     Other   Hyperlipidemia    Check a FLP today, apparently taking fenofibrate correctly      Relevant Medications   fenofibrate micronized (LOFIBRA) capsule   lisinopril (PRINIVIL,ZESTRIL) tablet   diltiazem (CARDIZEM CD) 24 hr capsule   Other Relevant Orders   Lipid panel (Completed)   Anxiety and depression    Refill Xanax and Paxil, check a UDS

## 2014-07-02 LAB — LIPID PANEL
CHOL/HDL RATIO: 4
Cholesterol: 199 mg/dL (ref 0–200)
HDL: 48.5 mg/dL (ref >39.00)
LDL Cholesterol: 119 mg/dL — ABNORMAL HIGH (ref 0–99)
NONHDL: 150.5
Triglycerides: 160 mg/dL — ABNORMAL HIGH (ref 0.0–149.0)
VLDL: 32 mg/dL (ref 0.0–40.0)

## 2014-07-02 LAB — CBC WITH DIFFERENTIAL/PLATELET
BASOS ABS: 0.1 10*3/uL (ref 0.0–0.1)
BASOS PCT: 0.5 % (ref 0.0–3.0)
EOS ABS: 0.3 10*3/uL (ref 0.0–0.7)
Eosinophils Relative: 3.1 % (ref 0.0–5.0)
HCT: 32.3 % — ABNORMAL LOW (ref 36.0–46.0)
Hemoglobin: 10.1 g/dL — ABNORMAL LOW (ref 12.0–15.0)
LYMPHS PCT: 15.6 % (ref 12.0–46.0)
Lymphs Abs: 1.6 10*3/uL (ref 0.7–4.0)
MCHC: 31.2 g/dL (ref 30.0–36.0)
MCV: 71.1 fl — ABNORMAL LOW (ref 78.0–100.0)
Monocytes Absolute: 0.6 10*3/uL (ref 0.1–1.0)
Monocytes Relative: 5.9 % (ref 3.0–12.0)
NEUTROS PCT: 74.9 % (ref 43.0–77.0)
Neutro Abs: 7.8 10*3/uL — ABNORMAL HIGH (ref 1.4–7.7)
Platelets: 338 10*3/uL (ref 150.0–400.0)
RBC: 4.54 Mil/uL (ref 3.87–5.11)
RDW: 16.7 % — AB (ref 11.5–15.5)
WBC: 10.4 10*3/uL (ref 4.0–10.5)

## 2014-07-02 LAB — FERRITIN: Ferritin: 4.8 ng/mL — ABNORMAL LOW (ref 10.0–291.0)

## 2014-07-02 LAB — IRON: Iron: 26 ug/dL — ABNORMAL LOW (ref 42–145)

## 2014-07-07 ENCOUNTER — Ambulatory Visit: Payer: Self-pay | Admitting: Internal Medicine

## 2014-07-10 ENCOUNTER — Telehealth: Payer: Self-pay | Admitting: Internal Medicine

## 2014-07-10 NOTE — Telephone Encounter (Signed)
LMOM informing her of UDS results, instructed her to make sure she is just taking the Paroxetine (Paxil) not both (Paroxetine and Citalopram), informed that it can be very dangerous mixing medications with out Dr approval. Informed her to return call if she has any other questions.

## 2014-07-10 NOTE — Telephone Encounter (Signed)
UDS is low risk but she is taking apparently both citalopram and paroxetine. Call patient, clarify what medicines she is taking, most definitely does not need to take both citalopram and paroxetine.

## 2014-07-31 ENCOUNTER — Telehealth: Payer: Self-pay | Admitting: Internal Medicine

## 2014-07-31 DIAGNOSIS — D509 Iron deficiency anemia, unspecified: Secondary | ICD-10-CM

## 2014-07-31 NOTE — Telephone Encounter (Signed)
Please call the patient, ask  about medication  compliance and how she's doing.

## 2014-08-03 NOTE — Telephone Encounter (Signed)
LMOM informing Pt to return call.  

## 2014-08-04 MED ORDER — ATORVASTATIN CALCIUM 10 MG PO TABS: 10.0000 mg | ORAL_TABLET | Freq: Every day | ORAL | Status: DC

## 2014-08-04 MED ORDER — FERROUS SULFATE 325 (65 FE) MG PO TABS
325.0000 mg | ORAL_TABLET | Freq: Two times a day (BID) | ORAL | Status: DC
Start: 2014-08-04 — End: 2015-03-17

## 2014-08-04 NOTE — Telephone Encounter (Signed)
Spoke with Pt, informed Pt of lab results from 06/2014. Informed her Dr. Larose Kells recommends seeing GI and for her to begin taking Ferrous Sulfate 325 mg 1 tablet bid and Lipitor 10 mg 1 tablet at bedtime. Pt verbalized understanding. Informed Pt to let us know if she does not hear from GI in several days to schedule an appt. Pt verbalized understanding.

## 2014-08-04 NOTE — Telephone Encounter (Signed)
Patient returned phone call. Best # [phone]

## 2014-09-02 ENCOUNTER — Ambulatory Visit: Payer: Self-pay | Admitting: Internal Medicine

## 2014-09-09 ENCOUNTER — Telehealth: Payer: Self-pay

## 2014-09-09 ENCOUNTER — Encounter: Payer: Self-pay | Admitting: Internal Medicine

## 2014-09-09 ENCOUNTER — Ambulatory Visit (INDEPENDENT_AMBULATORY_CARE_PROVIDER_SITE_OTHER): Payer: Self-pay | Admitting: Internal Medicine

## 2014-09-09 ENCOUNTER — Ambulatory Visit: Payer: Self-pay | Admitting: Internal Medicine

## 2014-09-09 VITALS — BP 122/78 | HR 97 | Temp 98.0°F | Ht 66.0 in | Wt 165.2 lb

## 2014-09-09 DIAGNOSIS — I10 Essential (primary) hypertension: Secondary | ICD-10-CM

## 2014-09-09 DIAGNOSIS — E119 Type 2 diabetes mellitus without complications: Secondary | ICD-10-CM

## 2014-09-09 DIAGNOSIS — F419 Anxiety disorder, unspecified: Secondary | ICD-10-CM

## 2014-09-09 DIAGNOSIS — F329 Major depressive disorder, single episode, unspecified: Secondary | ICD-10-CM

## 2014-09-09 DIAGNOSIS — F418 Other specified anxiety disorders: Secondary | ICD-10-CM

## 2014-09-09 DIAGNOSIS — K253 Acute gastric ulcer without hemorrhage or perforation: Secondary | ICD-10-CM

## 2014-09-09 DIAGNOSIS — E785 Hyperlipidemia, unspecified: Secondary | ICD-10-CM

## 2014-09-09 MED ORDER — ALPRAZOLAM 0.25 MG PO TABS: 0.5000 mg | ORAL_TABLET | Freq: Every evening | ORAL | Status: DC | PRN

## 2014-09-09 NOTE — Telephone Encounter (Signed)
Please ignore previous message at this time.

## 2014-09-09 NOTE — Progress Notes (Signed)
Pre visit review using our clinic review tool, if applicable. No additional management support is needed unless otherwise documented below in the visit note. 

## 2014-09-09 NOTE — Assessment & Plan Note (Signed)
BP is very good today, at home is slightly elevated at 140/90. Plan: BMP, no change for now

## 2014-09-09 NOTE — Assessment & Plan Note (Signed)
Last iron slightly low was recommended to see GI, did not go. Recommend to call GI and schedule an appointment, continue with iron, recheck CBC in few months

## 2014-09-09 NOTE — Telephone Encounter (Signed)
Per Dr. Larose Kells, due to numerous number of "NO SHOWS" and Cancellations, Pt needs dismissal process.

## 2014-09-09 NOTE — Progress Notes (Signed)
Subjective:    Patient ID: Julie [name], female    DOB: 1950/05/31, 64 y.o.   MRN: [id number]  DOS:  09/09/2014 Type of visit - description : f/u Interval history: Diabetes, good compliance with Amaryl and Glucophage, CBGs run from the 50s to the 300s, I review her log, I can't determine any pattern. When the blood sugar is low she does feel shaky. Anxiety depression, currently on Paxil only, symptoms relatively well control, needs a refill on Xanax Also 10 days history of on and off pain at the right lower abdomen , pain seem to encompass the back as well. It is on and off, mild, not worse by bending her torso. History of PUD-- did not see GI.   Review of Systems Denies fever chills, appetite is normal. No nausea, vomiting, diarrhea or blood in the stools. No dysuria, gross hematuria difficulty urinating. No vaginal discharge or bleeding  Past Medical History  Diagnosis Date  . Anxiety and depression   . Diabetes mellitus 06-2011    initial  CBGs ~400    . Migraines   . Hypertension 2011  . History of kidney stones   . Osteoporosis   . DJD (degenerative joint disease)   . GERD (gastroesophageal reflux disease)     Past Surgical History  Procedure Laterality Date  . Cholecystectomy  2003  . Wisdom tooth extraction    . Esophagogastroduodenoscopy N/A 10/01/2013    Procedure: ESOPHAGOGASTRODUODENOSCOPY (EGD);  Surgeon: Milus Banister, MD;  Location: [facility];  Service: Endoscopy;  Laterality: N/A;    History   Social History  . Marital Status: Married    Spouse Name: ayva veilleux  . Number of Children: 1  . Years of Education: N/A   Occupational History  . not working     Social History Main Topics  . Smoking status: Former Smoker    Types: Cigarettes    Quit date: 05/23/1979  . Smokeless tobacco: Never Used  . Alcohol Use: No     Comment: ocass  . Drug Use: No  . Sexual Activity: No   Other Topics Concern  . Not on file   Social History Narrative   Husband had a stroke        Medication List       This list is accurate as of: 09/09/14  2:51 PM.  Always use your most recent med list.               ALPRAZolam 0.25 MG tablet  Commonly known as:  XANAX  Take 2 tablets (0.5 mg total) by mouth at bedtime as needed for anxiety.     atorvastatin 10 MG tablet  Commonly known as:  LIPITOR  Take 1 tablet (10 mg total) by mouth daily.     diltiazem 240 MG 24 hr capsule  Commonly known as:  CARDIZEM CD  Take 1 capsule (240 mg total) by mouth daily.     fenofibrate micronized 200 MG capsule  Commonly known as:  LOFIBRA  TAKE ONE CAPSULE BY MOUTH ONCE DAILY BEFORE BREAKFAST     ferrous sulfate 325 (65 FE) MG tablet  Take 1 tablet (325 mg total) by mouth 2 (two) times daily with a meal.     glimepiride 4 MG tablet  Commonly known as:  AMARYL  Take 1 tablet (4 mg total) by mouth daily before breakfast.     lisinopril 10 MG tablet  Commonly known as:  PRINIVIL,ZESTRIL  Take 1 tablet (10 mg  total) by mouth daily.     metFORMIN 1000 MG tablet  Commonly known as:  GLUCOPHAGE  Take 1 tablet twice a day     PARoxetine 40 MG tablet  Commonly known as:  PAXIL  Take 1 tablet (40 mg total) by mouth every morning.     zolmitriptan 5 MG tablet  Commonly known as:  ZOMIG  Take 5 mg by mouth as needed for migraine.           Objective:   Physical Exam BP 122/78 mmHg  Pulse 97  Temp(Src) 98 F (36.7 C) (Oral)  Ht 5\' 6"  (1.676 m)  Wt 165 lb 4 oz (74.957 kg)  BMI 26.68 kg/m2  SpO2 97% General:   Well developed, well nourished . NAD.  HEENT:  Normocephalic . Face symmetric, atraumatic Lungs:  CTA B Normal respiratory effort, no intercostal retractions, no accessory muscle use. Heart: RRR,  no murmur.  Abdomen:  Not distended, soft, non-tender. No rebound or rigidity. No mass,organomegaly Muscle skeletal: no pretibial edema bilaterally  Trochanteric bursitis not tender Hips with normal rotation Skin: Not pale. Not  jaundice Neurologic:  alert & oriented X3.  Speech normal, gait appropriate for age and unassisted Psych--  Cognition and judgment appear intact.  Cooperative with normal attention span and concentration.  Behavior appropriate. No anxious or depressed appearing.       Assessment & Plan:    Right lower abdominal pain, abd exam is completely benign, review of systems without red flag symptoms. Plan: Check a UA, observe

## 2014-09-09 NOTE — Assessment & Plan Note (Addendum)
CBGs ranged from the 50-60s to the 300s, no pattern that I can tell. Her biggest meal of the days dinner. Plan: Check A1c, continue metformin, split  glimepiride to half tablet twice a day Refer to endo

## 2014-09-09 NOTE — Patient Instructions (Addendum)
  Please schedule labs to be done within few days (fasting) BMP, AST, ALT, FLP, A1c  Glimepiride 4 mg: Take half tablet with breakfast and half with dinner other medications the same  Please see your GI doctor  We are referring you to the endocrinologist   Come back to the office in 4 months  for a routine check up

## 2014-09-09 NOTE — Assessment & Plan Note (Signed)
Based on the last FLP, she  started statins, labs

## 2014-09-09 NOTE — Assessment & Plan Note (Addendum)
Last UDS showed both citalopram and  Paxil, she was really taking one or the other depending on what was available (taking her daughter's  citalopram if she was unable to afford Paxil).  At this point she is doing okay with Paxil only. Check a UDS in few months Refill Xanax

## 2014-09-16 ENCOUNTER — Other Ambulatory Visit (INDEPENDENT_AMBULATORY_CARE_PROVIDER_SITE_OTHER): Payer: Self-pay

## 2014-09-16 DIAGNOSIS — I10 Essential (primary) hypertension: Secondary | ICD-10-CM

## 2014-09-16 DIAGNOSIS — E785 Hyperlipidemia, unspecified: Secondary | ICD-10-CM

## 2014-09-16 DIAGNOSIS — E119 Type 2 diabetes mellitus without complications: Secondary | ICD-10-CM

## 2014-09-17 LAB — BASIC METABOLIC PANEL
BUN: 19 mg/dL (ref 6–23)
CALCIUM: 10 mg/dL (ref 8.4–10.5)
CO2: 24 mEq/L (ref 19–32)
Chloride: 107 mEq/L (ref 96–112)
Creatinine, Ser: 0.73 mg/dL (ref 0.40–1.20)
GFR: 85.87 mL/min (ref >60.00)
Glucose, Bld: 72 mg/dL (ref 70–99)
Potassium: 4 mEq/L (ref 3.5–5.1)
SODIUM: 139 meq/L (ref 135–145)

## 2014-09-17 LAB — LIPID PANEL
Cholesterol: 141 mg/dL (ref 0–200)
HDL: 40.9 mg/dL (ref >39.00)
LDL CALC: 82 mg/dL (ref 0–99)
NonHDL: 100.1
TRIGLYCERIDES: 90 mg/dL (ref 0.0–149.0)
Total CHOL/HDL Ratio: 3
VLDL: 18 mg/dL (ref 0.0–40.0)

## 2014-09-17 LAB — HEMOGLOBIN A1C: Hgb A1c MFr Bld: 6.2 % (ref 4.6–6.5)

## 2014-09-17 LAB — AST: AST: 15 U/L (ref 0–37)

## 2014-09-17 LAB — ALT: ALT: 17 U/L (ref 0–35)

## 2014-11-23 ENCOUNTER — Emergency Department (HOSPITAL_COMMUNITY)
Admission: EM | Admit: 2014-11-23 | Discharge: 2014-11-23 | Disposition: A | Discharge status: Home / Self Care | Payer: Self-pay | Attending: Emergency Medicine | Admitting: Emergency Medicine

## 2014-11-23 ENCOUNTER — Emergency Department (HOSPITAL_COMMUNITY): Payer: Self-pay

## 2014-11-23 ENCOUNTER — Encounter (HOSPITAL_COMMUNITY): Payer: Self-pay | Admitting: Emergency Medicine

## 2014-11-23 DIAGNOSIS — E119 Type 2 diabetes mellitus without complications: Secondary | ICD-10-CM | POA: Insufficient documentation

## 2014-11-23 DIAGNOSIS — Z8719 Personal history of other diseases of the digestive system: Secondary | ICD-10-CM | POA: Insufficient documentation

## 2014-11-23 DIAGNOSIS — F418 Other specified anxiety disorders: Secondary | ICD-10-CM | POA: Insufficient documentation

## 2014-11-23 DIAGNOSIS — Z87442 Personal history of urinary calculi: Secondary | ICD-10-CM | POA: Insufficient documentation

## 2014-11-23 DIAGNOSIS — I10 Essential (primary) hypertension: Secondary | ICD-10-CM | POA: Insufficient documentation

## 2014-11-23 DIAGNOSIS — N201 Calculus of ureter: Secondary | ICD-10-CM | POA: Insufficient documentation

## 2014-11-23 DIAGNOSIS — Z79899 Other long term (current) drug therapy: Secondary | ICD-10-CM | POA: Insufficient documentation

## 2014-11-23 DIAGNOSIS — N1339 Other hydronephrosis: Secondary | ICD-10-CM | POA: Insufficient documentation

## 2014-11-23 DIAGNOSIS — Z87891 Personal history of nicotine dependence: Secondary | ICD-10-CM | POA: Insufficient documentation

## 2014-11-23 DIAGNOSIS — Z8739 Personal history of other diseases of the musculoskeletal system and connective tissue: Secondary | ICD-10-CM | POA: Insufficient documentation

## 2014-11-23 DIAGNOSIS — R7989 Other specified abnormal findings of blood chemistry: Secondary | ICD-10-CM | POA: Insufficient documentation

## 2014-11-23 DIAGNOSIS — G43909 Migraine, unspecified, not intractable, without status migrainosus: Secondary | ICD-10-CM | POA: Insufficient documentation

## 2014-11-23 DIAGNOSIS — N1330 Unspecified hydronephrosis: Secondary | ICD-10-CM

## 2014-11-23 LAB — AMYLASE: Amylase: 23 U/L — ABNORMAL LOW (ref 28–100)

## 2014-11-23 LAB — URINALYSIS, ROUTINE W REFLEX MICROSCOPIC
BILIRUBIN URINE: NEGATIVE
Glucose, UA: NEGATIVE mg/dL
Ketones, ur: NEGATIVE mg/dL
NITRITE: NEGATIVE
PROTEIN: NEGATIVE mg/dL
Specific Gravity, Urine: 1.023 (ref 1.005–1.030)
UROBILINOGEN UA: 0.2 mg/dL (ref 0.0–1.0)
pH: 5.5 (ref 5.0–8.0)

## 2014-11-23 LAB — CBC WITH DIFFERENTIAL/PLATELET
BASOS ABS: 0 10*3/uL (ref 0.0–0.1)
Basophils Relative: 0 % (ref 0–1)
EOS ABS: 0.1 10*3/uL (ref 0.0–0.7)
Eosinophils Relative: 1 % (ref 0–5)
HEMATOCRIT: 35.2 % — AB (ref 36.0–46.0)
Hemoglobin: 10.8 g/dL — ABNORMAL LOW (ref 12.0–15.0)
LYMPHS ABS: 1 10*3/uL (ref 0.7–4.0)
LYMPHS PCT: 11 % — AB (ref 12–46)
MCH: 25.6 pg — AB (ref 26.0–34.0)
MCHC: 30.7 g/dL (ref 30.0–36.0)
MCV: 83.4 fL (ref 78.0–100.0)
Monocytes Absolute: 0.8 10*3/uL (ref 0.1–1.0)
Monocytes Relative: 8 % (ref 3–12)
NEUTROS PCT: 80 % — AB (ref 43–77)
Neutro Abs: 7.8 10*3/uL — ABNORMAL HIGH (ref 1.7–7.7)
Platelets: 244 10*3/uL (ref 150–400)
RBC: 4.22 MIL/uL (ref 3.87–5.11)
RDW: 15.2 % (ref 11.5–15.5)
WBC: 9.7 10*3/uL (ref 4.0–10.5)

## 2014-11-23 LAB — BASIC METABOLIC PANEL
ANION GAP: 9 (ref 5–15)
BUN: 18 mg/dL (ref 6–20)
CHLORIDE: 108 mmol/L (ref 101–111)
CO2: 23 mmol/L (ref 22–32)
Calcium: 9.3 mg/dL (ref 8.9–10.3)
Creatinine, Ser: 1.12 mg/dL — ABNORMAL HIGH (ref 0.44–1.00)
GFR calc Af Amer: 60 mL/min — ABNORMAL LOW (ref >60)
GFR calc non Af Amer: 52 mL/min — ABNORMAL LOW (ref >60)
GLUCOSE: 131 mg/dL — AB (ref 65–99)
Potassium: 4.2 mmol/L (ref 3.5–5.1)
SODIUM: 140 mmol/L (ref 135–145)

## 2014-11-23 LAB — URINE MICROSCOPIC-ADD ON

## 2014-11-23 MED ORDER — OXYCODONE-ACETAMINOPHEN 5-325 MG PO TABS: 1.0000 | ORAL_TABLET | ORAL | Status: DC | PRN

## 2014-11-23 MED ORDER — ONDANSETRON HCL 4 MG/2ML IJ SOLN
4.0000 mg | Freq: Once | INTRAMUSCULAR | Status: AC
Start: 2014-11-23 — End: 2014-11-23
  Administered 2014-11-23: 4 mg via INTRAVENOUS
  Filled 2014-11-23: qty 2

## 2014-11-23 MED ORDER — SODIUM CHLORIDE 0.9 % IV BOLUS (SEPSIS)
1000.0000 mL | Freq: Once | INTRAVENOUS | Status: AC
  Administered 2014-11-23: 1000 mL via INTRAVENOUS

## 2014-11-23 MED ORDER — ONDANSETRON HCL 4 MG PO TABS: 4.0000 mg | ORAL_TABLET | Freq: Four times a day (QID) | ORAL | Status: DC

## 2014-11-23 MED ORDER — MORPHINE SULFATE 4 MG/ML IJ SOLN
4.0000 mg | Freq: Once | INTRAMUSCULAR | Status: AC
  Administered 2014-11-23: 4 mg via INTRAVENOUS
  Filled 2014-11-23: qty 1

## 2014-11-23 MED ORDER — OXYCODONE-ACETAMINOPHEN 5-325 MG PO TABS
1.0000 | ORAL_TABLET | Freq: Once | ORAL | Status: AC
  Administered 2014-11-23: 1 via ORAL
  Filled 2014-11-23: qty 1

## 2014-11-23 NOTE — Discharge Instructions (Signed)
Return to the emergency room with worsening of symptoms, new symptoms or with symptoms that are concerning , especially fevers, abdominal pain in one area, unable to urinate, unable to keep down fluids, blood in stool or vomit, severe pain, you feel faint, lightheaded or pass out. Call for visit with urology tomorrow as recommended by the urologist on call. Read below information and follow recommendations.  Hydronephrosis Hydronephrosis is an abnormal enlargement of your kidney. It can affect one or both the kidneys. It results from the backward pressure of urine on the kidneys, when the flow of urine is blocked. Normally, the urine drains from the kidney through the urine tube (ureter), into a sac which holds the urine until urination (bladder). When the urinary flow is blocked, the urine collects above the block. This causes an increase in the pressure inside the kidney, which in turn leads to its enlargement. The block can occur at the point where the kidney joins the ureter. Treatment depends on the cause and location of the block.  CAUSES  The causes of this condition include:  Birth defect of the kidney or ureter.  Kink at the point where the kidney joins the ureter.  Stones and blood clots in the kidney or ureter.  Cancer, injury, or infection of the ureter.  Scar tissue formation.  Backflow of urine (reflux).  Cancer of bladder or prostate gland.  Abnormality of the nerves or muscles of the kidney or ureter.  Lower part of the ureter protruding into the bladder (ureterocele).  Abnormal contractions of the bladder.  Both the kidneys can be affected during pregnancy. This is because the enlarging uterus presses on the ureters and blocks the flow of urine. SYMPTOMS  The symptoms depend on the location of the block. They also depend on how long the block has been present. You may feel pain on the affected side. Sometimes, you may not have any symptoms. There may be a dull ache or  discomfort in the flank. The common symptoms are:  Flank pain.  Swelling of the abdomen.  Pain in the abdomen.  Nausea and vomiting.  Fever.  Pain while passing urine.  Urgency for urination.  Frequent or urgent urination.  Infection of the urinary tract. DIAGNOSIS  Your caregiver will examine you after asking about your symptoms. You may be asked to do blood and urine tests. Your caregiver may order a special X-ray, ultrasound, or CT scan. Sometimes a rigid or flexible telescope (cystoscope) is used to view the site of the blockage.  TREATMENT  Treatment depends on the site, cause, and duration of the block. The goal of treatment is to remove the blockage. Your caregiver will plan the treatment based on your condition. The different types of treatment are:   Putting in a soft plastic tube (ureteral stent) to connect the bladder with the kidney. This will help in draining the urine.  Putting in a soft tube (nephrostomy tube). This is placed through skin into the kidney. The trapped urine is drained out through the back. A plastic bag is attached to your skin to hold the urine that has drained out.  Antibiotics to treat or prevent infection.  Breaking down of the stone (lithotripsy). HOME CARE INSTRUCTIONS   It may take some time for the hydronephrosis to go away (resolve). Drink fluids as directed by your caregiver , and get a lot of rest.  If you have a drain in, your caregiver will give you directions about how to care for  it. Be sure you understand these directions completely before you go home.  Take any antibiotics, pain medications, or other prescriptions exactly as prescribed.  Follow-up with your caregivers as directed. SEEK MEDICAL CARE IF:   You continue to have flank pain, nausea, or difficulty with urination.  You have any problem with any type of drainage device.  Your urine becomes cloudy or bloody. SEEK IMMEDIATE MEDICAL CARE IF:   You have severe flank  and/or abdominal pain.  You develop vomiting and are unable to hold down fluids.  You develop a fever above 100.5 F (38.1 C), or as per your caregiver. MAKE SURE YOU:   Understand these instructions.  Will watch your condition.  Will get help right away if you are not doing well or get worse. Document Released: 03/05/2007 Document Revised: 07/31/2011 Document Reviewed: 04/21/2010 [facility] Patient Information 2015 [place], [place]. This information is not intended to replace advice given to you by your health care provider. Make sure you discuss any questions you have with your health care provider.  Kidney Stones Kidney stones (urolithiasis) are deposits that form inside your kidneys. The intense pain is caused by the stone moving through the urinary tract. When the stone moves, the ureter goes into spasm around the stone. The stone is usually passed in the urine.  CAUSES   A disorder that makes certain neck glands produce too much parathyroid hormone (primary hyperparathyroidism).  A buildup of uric acid crystals, similar to gout in your joints.  Narrowing (stricture) of the ureter.  A kidney obstruction present at birth (congenital obstruction).  Previous surgery on the kidney or ureters.  Numerous kidney infections. SYMPTOMS   Feeling sick to your stomach (nauseous).  Throwing up (vomiting).  Blood in the urine (hematuria).  Pain that usually spreads (radiates) to the groin.  Frequency or urgency of urination. DIAGNOSIS   Taking a history and physical exam.  Blood or urine tests.  CT scan.  Occasionally, an examination of the inside of the urinary bladder (cystoscopy) is performed. TREATMENT   Observation.  Increasing your fluid intake.  Extracorporeal shock wave lithotripsy--This is a noninvasive procedure that uses shock waves to break up kidney stones.  Surgery may be needed if you have severe pain or persistent obstruction. There are various surgical  procedures. Most of the procedures are performed with the use of small instruments. Only small incisions are needed to accommodate these instruments, so recovery time is minimized. The size, location, and chemical composition are all important variables that will determine the proper choice of action for you. Talk to your health care provider to better understand your situation so that you will minimize the risk of injury to yourself and your kidney.  HOME CARE INSTRUCTIONS   Drink enough water and fluids to keep your urine clear or pale yellow. This will help you to pass the stone or stone fragments.  Strain all urine through the provided strainer. Keep all particulate matter and stones for your health care provider to see. The stone causing the pain may be as small as a grain of salt. It is very important to use the strainer each and every time you pass your urine. The collection of your stone will allow your health care provider to analyze it and verify that a stone has actually passed. The stone analysis will often identify what you can do to reduce the incidence of recurrences.  Only take over-the-counter or prescription medicines for pain, discomfort, or fever as directed by your  health care provider.  Make a follow-up appointment with your health care provider as directed.  Get follow-up X-rays if required. The absence of pain does not always mean that the stone has passed. It may have only stopped moving. If the urine remains completely obstructed, it can cause loss of kidney function or even complete destruction of the kidney. It is your responsibility to make sure X-rays and follow-ups are completed. Ultrasounds of the kidney can show blockages and the status of the kidney. Ultrasounds are not associated with any radiation and can be performed easily in a matter of minutes. SEEK MEDICAL CARE IF:  You experience pain that is progressive and unresponsive to any pain medicine you have been  prescribed. SEEK IMMEDIATE MEDICAL CARE IF:   Pain cannot be controlled with the prescribed medicine.  You have a fever or shaking chills.  The severity or intensity of pain increases over 18 hours and is not relieved by pain medicine.  You develop a new onset of abdominal pain.  You feel faint or pass out.  You are unable to urinate. MAKE SURE YOU:   Understand these instructions.  Will watch your condition.  Will get help right away if you are not doing well or get worse. Document Released: 05/08/2005 Document Revised: 01/08/2013 Document Reviewed: 10/09/2012 [facility] Patient Information 2015 [place], [place]. This information is not intended to replace advice given to you by your health care provider. Make sure you discuss any questions you have with your health care provider.

## 2014-11-23 NOTE — ED Provider Notes (Signed)
CSN: [id number]     Arrival date & time 11/23/14  1314 History   First MD Initiated Contact with Patient 11/23/14 1503     Chief Complaint  Patient presents with  . Abdominal Pain    right side  . Flank Pain     (Consider location/radiation/quality/duration/timing/severity/associated sxs/prior Treatment) HPI  Julie [name] is a 64 y.o. female with PMH of DM, HTN, nephrolithiasis presenting with right lower quadrant total abdominal pain that started last night and is constant and gets worse in waves. Pain radiates to her back. She denies fevers, chills. She endorses nausea but no emesis. Last BM today and normal without blood and no melena. Patient denies hematuria or other urinary symptoms. She denies pelvic discomfort or discharge. Abdominal surgeries include cholecystectomy.   Past Medical History  Diagnosis Date  . Anxiety and depression   . Diabetes mellitus 06-2011    initial  CBGs ~400    . Migraines   . Hypertension 2011  . History of kidney stones   . Osteoporosis   . DJD (degenerative joint disease)   . GERD (gastroesophageal reflux disease)    Past Surgical History  Procedure Laterality Date  . Cholecystectomy  2003  . Wisdom tooth extraction    . Esophagogastroduodenoscopy N/A 10/01/2013    Procedure: ESOPHAGOGASTRODUODENOSCOPY (EGD);  Surgeon: Milus Banister, MD;  Location: [facility];  Service: Endoscopy;  Laterality: N/A;   Family History  Problem Relation Age of Onset  . Cancer Mother     ovary/uterus cancer  . Hyperlipidemia Mother     M and F  . Heart disease Mother     M had CHF  . Hypertension Mother     Jerilynn Mages and F  . Diabetes Father   . Mental illness Maternal Grandmother   . Hypertension Maternal Grandmother   . Breast cancer Maternal Grandmother   . Mental illness Maternal Grandfather   . Hypertension Maternal Grandfather   . Mental illness Paternal Grandmother   . Hypertension Paternal Grandmother   . Breast cancer Paternal Grandmother   .  Mental illness Paternal Grandfather   . Hypertension Paternal Grandfather   . Stomach cancer Maternal Grandfather   . Colon cancer Neg Hx    History  Substance Use Topics  . Smoking status: Former Smoker    Types: Cigarettes    Quit date: 05/23/1979  . Smokeless tobacco: Never Used  . Alcohol Use: No     Comment: ocass   OB History    Gravida Para Term Preterm AB TAB SAB Ectopic Multiple Living   1 1 1       1      Review of Systems 10 Systems reviewed and are negative for acute change except as noted in the HPI.    Allergies  Review of patient's allergies indicates no known allergies.  Home Medications   Prior to Admission medications   Medication Sig Start Date End Date Taking? Authorizing Provider  ALPRAZolam Duanne Moron) 0.25 MG tablet Take 2 tablets (0.5 mg total) by mouth at bedtime as needed for anxiety. 09/09/14  Yes Colon Branch, MD  atorvastatin (LIPITOR) 10 MG tablet Take 1 tablet (10 mg total) by mouth daily. 08/04/14  Yes Colon Branch, MD  diltiazem (CARDIZEM CD) 240 MG 24 hr capsule Take 1 capsule (240 mg total) by mouth daily. Patient taking differently: Take 240 mg by mouth every evening.  07/01/14  Yes Colon Branch, MD  fenofibrate micronized (LOFIBRA) 200 MG capsule TAKE  ONE CAPSULE BY MOUTH ONCE DAILY BEFORE BREAKFAST 07/01/14  Yes Colon Branch, MD  ferrous sulfate 325 (65 FE) MG tablet Take 1 tablet (325 mg total) by mouth 2 (two) times daily with a meal. 08/04/14  Yes Colon Branch, MD  glimepiride (AMARYL) 4 MG tablet Take 1 tablet (4 mg total) by mouth daily before breakfast. 07/01/14  Yes Colon Branch, MD  lisinopril (PRINIVIL,ZESTRIL) 10 MG tablet Take 1 tablet (10 mg total) by mouth daily. 07/01/14  Yes Colon Branch, MD  metFORMIN (GLUCOPHAGE) 1000 MG tablet Take 1 tablet twice a day 07/01/14  Yes Colon Branch, MD  PARoxetine (PAXIL) 40 MG tablet Take 1 tablet (40 mg total) by mouth every morning. 07/01/14  Yes Colon Branch, MD  ondansetron (ZOFRAN) 4 MG tablet Take 1 tablet (4 mg  total) by mouth every 6 (six) hours. 11/23/14   Al Corpus, PA-C  oxyCODONE-acetaminophen (PERCOCET/ROXICET) 5-325 MG per tablet Take 1 tablet by mouth every 4 (four) hours as needed for severe pain. 11/23/14   Al Corpus, PA-C  zolmitriptan (ZOMIG) 5 MG tablet Take 5 mg by mouth as needed for migraine. 04/28/13   Colon Branch, MD   BP 132/72 mmHg  Pulse 86  Temp(Src) 98 F (36.7 C) (Oral)  Resp 20  Ht 5\' 6"  (1.676 m)  Wt 160 lb (72.576 kg)  BMI 25.84 kg/m2  SpO2 99% Physical Exam  Constitutional: She appears well-developed and well-nourished. No distress.  HENT:  Head: Normocephalic and atraumatic.  Dry Mucous membranes  Eyes: Conjunctivae and EOM are normal. Right eye exhibits no discharge. Left eye exhibits no discharge.  Cardiovascular: Normal rate and regular rhythm.   Pulmonary/Chest: Effort normal and breath sounds normal. No respiratory distress. She has no wheezes.  Abdominal: Soft. She exhibits no distension.  Hyperactive bowel sounds with right lower quadrant tenderness without rebound, rigidity. Voluntary guarding. No umbilical tenderness or tenderness at McBurney's point. Negative Rovsing's and psoas sign. Right-sided CVA tenderness.  Neurological: She is alert. She exhibits normal muscle tone. Coordination normal.  Skin: Skin is warm and dry. She is not diaphoretic.  Nursing note and vitals reviewed.   ED Course  Procedures (including critical care time) Labs Review Labs Reviewed  AMYLASE - Abnormal; Notable for the following:    Amylase 23 (*)    All other components within normal limits  CBC WITH DIFFERENTIAL/PLATELET - Abnormal; Notable for the following:    Hemoglobin 10.8 (*)    HCT 35.2 (*)    MCH 25.6 (*)    Neutrophils Relative % 80 (*)    Neutro Abs 7.8 (*)    Lymphocytes Relative 11 (*)    All other components within normal limits  BASIC METABOLIC PANEL - Abnormal; Notable for the following:    Glucose, Bld 131 (*)    Creatinine, Ser 1.12 (*)     GFR calc non Af Amer 52 (*)    GFR calc Af Amer 60 (*)    All other components within normal limits  URINALYSIS, ROUTINE W REFLEX MICROSCOPIC (NOT AT [facility]) - Abnormal; Notable for the following:    Hgb urine dipstick TRACE (*)    Leukocytes, UA TRACE (*)    All other components within normal limits  URINE MICROSCOPIC-ADD ON - Abnormal; Notable for the following:    Squamous Epithelial / LPF FEW (*)    All other components within normal limits    Imaging Review Ct Abdomen Pelvis Wo Contrast  11/23/2014  CLINICAL DATA:  Acute onset of low back pain 14 hr ago. Lower abdominal pain. Bilateral flank pain and nausea.  EXAM: CT ABDOMEN AND PELVIS WITHOUT CONTRAST  TECHNIQUE: Multidetector CT imaging of the abdomen and pelvis was performed following the standard protocol without IV contrast.  COMPARISON:  12/18/2011  FINDINGS: Lung bases are clear. No pleural or pericardial fluid. The liver has normal appearance without contrast. Previous cholecystectomy. The spleen is normal. The pancreas is normal. The adrenal glands are normal. The left kidney contains nonobstructing stones in the lower pole caliceal system. On the right, the kidney is swollen and there is hydroureteronephrosis. There are several stones in the right renal collecting system, the largest in the lower pole caliceal system measuring up to 2 cm in diameter. The right ureter is dilated to the pelvic per ram where there is an obstructing stone measuring 1 cm in diameter. No stone distal to that. No stone in the bladder.  The aorta shows atherosclerosis but no aneurysm. The IVC is normal. No retroperitoneal mass or lymphadenopathy. No acute or significant bowel finding. Uterus and adnexal regions appear normal. There is degenerative disease of the lower lumbar spine including degenerative anterolisthesis at L5-S1 of 9 mm with spinal stenosis and potential for neural compression.  IMPRESSION: Hydroureteronephrosis on the right because of a 1 cm  obstructing stone in the right ureter at the pelvic brim.  Numerous nonobstructing stones in both kidneys.   Electronically Signed   By: Nelson Chimes M.D.   On: 11/23/2014 16:13     EKG Interpretation None      Meds given in ED:  Medications  sodium chloride 0.9 % bolus 1,000 mL (0 mLs Intravenous Stopped 11/23/14 1708)  morphine 4 MG/ML injection 4 mg (4 mg Intravenous Given 11/23/14 1527)  ondansetron (ZOFRAN) injection 4 mg (4 mg Intravenous Given 11/23/14 1525)  oxyCODONE-acetaminophen (PERCOCET/ROXICET) 5-325 MG per tablet 1 tablet (1 tablet Oral Given 11/23/14 1708)    New Prescriptions   ONDANSETRON (ZOFRAN) 4 MG TABLET    Take 1 tablet (4 mg total) by mouth every 6 (six) hours.   OXYCODONE-ACETAMINOPHEN (PERCOCET/ROXICET) 5-325 MG PER TABLET    Take 1 tablet by mouth every 4 (four) hours as needed for severe pain.      MDM   Final diagnoses:  Ureterolithiasis  Hydroureteronephrosis  Elevated serum creatinine   Pt with one cm obstructing kidney stone. No evidence of infection. Pt with creatine increase to 1.12 from 0.73 in April 2016. After morphine and zofran pt states pain is a 2 and no nausea. No vomiting in ED. Pt transitioned to oral pain medications and reports significantly reduced pain. Consult to urology. Spoke with Dr. Diona Fanti who recommended close follow up in the office tomorrow, pain medication. No flomax. Pt tolerating fluids in ED without difficulty. Pain controlled. Pt nontoxic well appearing and stable for discharge.   Discussed return precautions with patient. Discussed all results and patient verbalizes understanding and agrees with plan.  Case has been discussed with Dr. Jeanell Sparrow who agrees with the above plan and to discharge.     Al Corpus, PA-C 11/23/14 1723  Pattricia Boss, MD 11/24/14 [phone]

## 2014-11-23 NOTE — ED Notes (Signed)
Pt. Stated, during the night my right side started hurting and goes to my back.

## 2014-12-01 ENCOUNTER — Other Ambulatory Visit: Payer: Self-pay | Admitting: Urology

## 2014-12-07 ENCOUNTER — Encounter (HOSPITAL_BASED_OUTPATIENT_CLINIC_OR_DEPARTMENT_OTHER): Payer: Self-pay | Admitting: *Deleted

## 2014-12-09 ENCOUNTER — Ambulatory Visit (HOSPITAL_BASED_OUTPATIENT_CLINIC_OR_DEPARTMENT_OTHER): Admit: 2014-12-09 | Payer: MEDICAID | Admitting: Urology

## 2014-12-09 ENCOUNTER — Encounter (HOSPITAL_BASED_OUTPATIENT_CLINIC_OR_DEPARTMENT_OTHER): Payer: Self-pay

## 2014-12-09 HISTORY — DX: Calculus of ureter: N20.1

## 2014-12-09 HISTORY — DX: Personal history of other diseases of the digestive system: Z87.19

## 2014-12-09 HISTORY — DX: Calculus of kidney: N20.0

## 2014-12-09 HISTORY — DX: Personal history of peptic ulcer disease: Z87.11

## 2014-12-09 HISTORY — DX: Type 2 diabetes mellitus without complications: E11.9

## 2014-12-09 SURGERY — CYSTOURETEROSCOPY, WITH RETROGRADE PYELOGRAM AND STENT INSERTION
Anesthesia: General | Laterality: Right

## 2014-12-24 ENCOUNTER — Other Ambulatory Visit: Payer: Self-pay | Admitting: Internal Medicine

## 2015-01-07 ENCOUNTER — Other Ambulatory Visit: Payer: Self-pay | Admitting: Internal Medicine

## 2015-01-15 ENCOUNTER — Ambulatory Visit: Payer: Self-pay | Admitting: Internal Medicine

## 2015-01-20 ENCOUNTER — Telehealth: Payer: Self-pay | Admitting: Internal Medicine

## 2015-01-20 NOTE — Telephone Encounter (Signed)
Pt was no show 01/15/15 1:15pm, 4 month follow up appt, pt has not reschedule, left msg on home # to reschedule, charge for no show?

## 2015-01-20 NOTE — Telephone Encounter (Signed)
No , is okay

## 2015-02-26 ENCOUNTER — Other Ambulatory Visit: Payer: Self-pay

## 2015-02-26 MED ORDER — METFORMIN HCL 1000 MG PO TABS: 1000.0000 mg | ORAL_TABLET | Freq: Two times a day (BID) | ORAL | Status: DC

## 2015-02-26 MED ORDER — LISINOPRIL 10 MG PO TABS: 10.0000 mg | ORAL_TABLET | Freq: Every day | ORAL | Status: DC

## 2015-03-11 ENCOUNTER — Telehealth: Payer: Self-pay | Admitting: Internal Medicine

## 2015-03-11 NOTE — Telephone Encounter (Signed)
Pt needing alprazolam. She has 2 left. Takes 1/day. Please send to [facility] in [place]. Pt has appt scheduled 03/17/15.

## 2015-03-12 MED ORDER — ALPRAZOLAM 0.25 MG PO TABS: 0.5000 mg | ORAL_TABLET | Freq: Every evening | ORAL | Status: DC | PRN

## 2015-03-12 NOTE — Telephone Encounter (Signed)
Pt is requesting refill on Alprazolam.  Last OV: 09/09/2014, appt scheduled 03/17/2015 Last Fill: 09/09/2014 #60 and 3RF  UDS: 07/01/2014 Low risk  Pt needs contract at next OV.   Please advise.

## 2015-03-12 NOTE — Telephone Encounter (Signed)
Rx faxed to [facility]  

## 2015-03-12 NOTE — Telephone Encounter (Signed)
Rx printed, awaiting MD signature.  

## 2015-03-12 NOTE — Telephone Encounter (Signed)
Ok 60 and 2 RF

## 2015-03-17 ENCOUNTER — Encounter: Payer: Self-pay | Admitting: Internal Medicine

## 2015-03-17 ENCOUNTER — Ambulatory Visit (INDEPENDENT_AMBULATORY_CARE_PROVIDER_SITE_OTHER): Payer: Self-pay | Admitting: Internal Medicine

## 2015-03-17 VITALS — BP 130/78 | HR 75 | Temp 97.8°F | Ht 67.0 in | Wt 165.5 lb

## 2015-03-17 DIAGNOSIS — N201 Calculus of ureter: Secondary | ICD-10-CM

## 2015-03-17 DIAGNOSIS — Z23 Encounter for immunization: Secondary | ICD-10-CM

## 2015-03-17 DIAGNOSIS — Z09 Encounter for follow-up examination after completed treatment for conditions other than malignant neoplasm: Secondary | ICD-10-CM

## 2015-03-17 DIAGNOSIS — I10 Essential (primary) hypertension: Secondary | ICD-10-CM

## 2015-03-17 DIAGNOSIS — E119 Type 2 diabetes mellitus without complications: Secondary | ICD-10-CM

## 2015-03-17 NOTE — Progress Notes (Signed)
Pre visit review using our clinic review tool, if applicable. No additional management support is needed unless otherwise documented below in the visit note. 

## 2015-03-17 NOTE — Patient Instructions (Signed)
Get your blood work before you leave   We'll schedule a ultrasound of the kidneys   Next visit  for a   routine checkup in 4 months, no fasting  Please schedule an appointment at the front desk

## 2015-03-17 NOTE — Progress Notes (Signed)
Subjective:    Patient ID: Julie [name], female    DOB: [birth year], 64 y.o.   MRN: [id number]  DOS:  03/17/2015 Type of visit - description : Routine follow-up Interval history: DM:  Good compliance with medication, CBGs are   in the 300 (despite the last A1c being ~ 6). Very rarely they drop  low in the 100s, mostly when she skips a meal. High cholesterol: Good compliance with medications. Hypertension: Not taking lisinopril, no apparent reason, she probably ran out. ER 11/23/2014: Right lower quadrant abdominal pain, CT show hydroureteronephrosis on the right 1 cm obstructing stone. Labs : creat 1.1, Hg 10.8.  She was recommended to see urology ASAP but [facility] (no  Insurance) and b/c pain subsided a week later, currently asymptomatic   Review of Systems Denies chest pain or difficulty breathing No nausea, vomiting, diarrhea or blood in the stools. No flank pain. No recent migraines  Past Medical History  Diagnosis Date  . Anxiety and depression   . Migraines   . Hypertension 2011  . History of kidney stones   . Osteoporosis   . DJD (degenerative joint disease)   . GERD (gastroesophageal reflux disease)   . Type 2 diabetes mellitus ([place])   . History of gastric ulcer     w/  bleeding-  05/ 2015  . Right ureteral stone   . Nephrolithiasis     bilateral    Past Surgical History  Procedure Laterality Date  . Cholecystectomy  2003  . Wisdom tooth extraction    . Esophagogastroduodenoscopy N/A 10/01/2013    Procedure: ESOPHAGOGASTRODUODENOSCOPY (EGD);  Surgeon: Milus Banister, MD;  Location: [facility];  Service: Endoscopy;  Laterality: N/A;    Social History   Social History  . Marital Status: Married    Spouse Name: Julie [name]  . Number of Children: 1  . Years of Education: N/A   Occupational History  . not working     Social History Main Topics  . Smoking status: Former Smoker    Types: Cigarettes    Quit date: 05/23/1979  . Smokeless tobacco: Never  Used  . Alcohol Use: No     Comment: ocass  . Drug Use: No  . Sexual Activity: No   Other Topics Concern  . Not on file   Social History Narrative   Husband had a stroke        Medication List       This list is accurate as of: 03/17/15 11:59 PM.  Always use your most recent med list.               acetaminophen 500 MG tablet  Commonly known as:  TYLENOL  Take 500 mg by mouth every 6 (six) hours as needed for headache.     ALPRAZolam 0.25 MG tablet  Commonly known as:  XANAX  Take 2 tablets (0.5 mg total) by mouth at bedtime as needed for anxiety.     atorvastatin 10 MG tablet  Commonly known as:  LIPITOR  Take 1 tablet (10 mg total) by mouth daily.     diltiazem 240 MG 24 hr capsule  Commonly known as:  CARDIZEM CD  Take 1 capsule (240 mg total) by mouth daily.     fenofibrate micronized 200 MG capsule  Commonly known as:  LOFIBRA  Take 1 capsule (200 mg total) by mouth daily before breakfast.     glimepiride 4 MG tablet  Commonly known as:  AMARYL  Take  1 tablet (4 mg total) by mouth daily before breakfast.     metFORMIN 1000 MG tablet  Commonly known as:  GLUCOPHAGE  Take 1 tablet (1,000 mg total) by mouth 2 (two) times daily with a meal.     PARoxetine 40 MG tablet  Commonly known as:  PAXIL  Take 1 tablet (40 mg total) by mouth every morning.     zolmitriptan 5 MG tablet  Commonly known as:  ZOMIG  Take 5 mg by mouth as needed for migraine.           Objective:   Physical Exam BP 130/78 mmHg  Pulse 75  Temp(Src) 97.8 F (36.6 C) (Oral)  Ht 5\' 7"  (1.702 m)  Wt 165 lb 8 oz (75.07 kg)  BMI 25.91 kg/m2  SpO2 98% General:   Well developed, well nourished . NAD.  HEENT:  Normocephalic . Face symmetric, atraumatic Lungs:  CTA B Normal respiratory effort, no intercostal retractions, no accessory muscle use. Heart: RRR,  no murmur.  no pretibial edema bilaterally  Abdomen:  Not distended, soft, non-tender. No rebound or rigidity. No CVA  tenderness Skin: Not pale. Not jaundice Neurologic:  alert & oriented X3.  Speech normal, gait appropriate for age and unassisted Psych--  Cognition and judgment appear intact.  Cooperative with normal attention span and concentration.  Behavior appropriate. No anxious or depressed appearing.    Assessment & Plan:   Assessment > DM   Hypertension Anxiety depression Migraine headaches DJD Nephrolithiasis bilateral GI --GERD --Prepyloric gastric ulcer 09-2013, DNKA for GI f/u Osteoporosis   Plan: Unfortunately, patient is unable to afford many of the routine/ standard-of-care  advise d/t lack of insurance. DM: She continue to report elevated CBGs in the 300s, A1c was around 6, unclear if CBGs are accurate. Planning to check A1c, further advice would result. Declined  ophthalmology referral. Hypertension: Not taking lisinopril, BP today is okay. No change. Check a BMP Urolithiasis: She really needed to see urology but [facility] due to lack of insurance and b/c she was feeling better. I recommend a renal ultrasound, we must know if the hydronephrosis resolved. Anxiety-depression: Needs a contract today d/t rx of xanax Primary care: Flu shot today. Ask her to see a gynecologist at [facility] to look into financial assistance within the system. RTC 4 months

## 2015-03-18 DIAGNOSIS — Z09 Encounter for follow-up examination after completed treatment for conditions other than malignant neoplasm: Secondary | ICD-10-CM | POA: Insufficient documentation

## 2015-03-18 LAB — BASIC METABOLIC PANEL
BUN: 21 mg/dL (ref 6–23)
CHLORIDE: 107 meq/L (ref 96–112)
CO2: 25 meq/L (ref 19–32)
CREATININE: 0.73 mg/dL (ref 0.40–1.20)
Calcium: 10.2 mg/dL (ref 8.4–10.5)
GFR: 85.73 mL/min (ref >60.00)
Glucose, Bld: 130 mg/dL — ABNORMAL HIGH (ref 70–99)
Potassium: 4.9 mEq/L (ref 3.5–5.1)
Sodium: 140 mEq/L (ref 135–145)

## 2015-03-18 LAB — HEMOGLOBIN A1C: Hgb A1c MFr Bld: 7.4 % — ABNORMAL HIGH (ref 4.6–6.5)

## 2015-03-18 NOTE — Assessment & Plan Note (Signed)
Unfortunately, patient is unable to afford many of the routine/ standard-of-care  advise d/t lack of insurance. DM: She continue to report elevated CBGs in the 300s, A1c was around 6, unclear if CBGs are accurate. Planning to check A1c, further advice would result. Declined  ophthalmology referral. Hypertension: Not taking lisinopril, BP today is okay. No change. Check a BMP Urolithiasis: She really needed to see urology but [facility] due to lack of insurance and b/c she was feeling better. I recommend a renal ultrasound, we must know if the hydronephrosis resolved. Anxiety-depression: Needs a contract today d/t rx of xanax Primary care: Flu shot today. Ask her to see a gynecologist at the [facility] to look into financial assistance within the system. RTC 4 months

## 2015-03-24 ENCOUNTER — Ambulatory Visit (HOSPITAL_BASED_OUTPATIENT_CLINIC_OR_DEPARTMENT_OTHER)
Admission: RE | Admit: 2015-03-24 | Discharge: 2015-03-24 | Disposition: A | Discharge status: Home / Self Care | Payer: MEDICAID | Source: Ambulatory Visit | Attending: Internal Medicine | Admitting: Internal Medicine

## 2015-03-24 DIAGNOSIS — N201 Calculus of ureter: Secondary | ICD-10-CM | POA: Insufficient documentation

## 2015-03-24 DIAGNOSIS — N200 Calculus of kidney: Secondary | ICD-10-CM | POA: Insufficient documentation

## 2015-03-24 MED ORDER — GLIMEPIRIDE 4 MG PO TABS: 6.0000 mg | ORAL_TABLET | Freq: Every day | ORAL | Status: DC

## 2015-03-24 NOTE — Addendum Note (Signed)
Addended by: Wilfrid Lund on: 03/24/2015 11:53 AM   Modules accepted: Orders

## 2015-04-02 ENCOUNTER — Other Ambulatory Visit: Payer: Self-pay | Admitting: Internal Medicine

## 2015-04-06 ENCOUNTER — Other Ambulatory Visit: Payer: Self-pay | Admitting: Internal Medicine

## 2015-04-06 NOTE — Telephone Encounter (Signed)
Pt is requesting refill on Zomig.   Last OV: 03/17/2015 Last Fill: 04/28/2013 #10 and 3RF   Please advise.

## 2015-04-07 NOTE — Telephone Encounter (Signed)
Okay #10 and 3 refills

## 2015-04-07 NOTE — Telephone Encounter (Signed)
Rx sent 

## 2015-04-22 ENCOUNTER — Other Ambulatory Visit: Payer: Self-pay | Admitting: Internal Medicine

## 2015-07-01 ENCOUNTER — Other Ambulatory Visit: Payer: Self-pay

## 2015-07-01 MED ORDER — FENOFIBRATE MICRONIZED 200 MG PO CAPS: 200.0000 mg | ORAL_CAPSULE | Freq: Every day | ORAL | Status: AC

## 2015-07-06 ENCOUNTER — Other Ambulatory Visit: Payer: Self-pay

## 2015-07-06 MED ORDER — ATORVASTATIN CALCIUM 10 MG PO TABS: 10.0000 mg | ORAL_TABLET | Freq: Every day | ORAL | Status: AC

## 2015-07-19 ENCOUNTER — Other Ambulatory Visit: Payer: Self-pay

## 2015-07-20 ENCOUNTER — Ambulatory Visit: Payer: Self-pay | Admitting: Internal Medicine

## 2015-07-21 ENCOUNTER — Telehealth: Payer: Self-pay

## 2015-07-21 NOTE — Telephone Encounter (Signed)
Letter printed and mailed to Pt.  

## 2015-07-21 NOTE — Telephone Encounter (Signed)
Pt has numerous no show/cancellations.  11/27/2013 Routine F/U- No Show 06/23/2014- Routine F/U- called and cancelled appt same morning 09/02/2014- Routine F/U- called and cancelled appt same morning 09/09/2014- Acute visit- No Show 01/15/2015- Routine F/U- No Show 07/12/2015- Routine F/U- called and cancelled appt same morning   Please advise.

## 2015-07-21 NOTE — Telephone Encounter (Signed)
She has a number of challenges, for now please send a letter and ask for reschedule.

## 2015-08-17 ENCOUNTER — Ambulatory Visit (INDEPENDENT_AMBULATORY_CARE_PROVIDER_SITE_OTHER): Payer: Self-pay | Admitting: Internal Medicine

## 2015-08-17 ENCOUNTER — Encounter: Payer: Self-pay | Admitting: Internal Medicine

## 2015-08-17 VITALS — BP 122/74 | HR 94 | Temp 98.0°F | Ht 67.0 in | Wt 165.2 lb

## 2015-08-17 DIAGNOSIS — E119 Type 2 diabetes mellitus without complications: Secondary | ICD-10-CM

## 2015-08-17 DIAGNOSIS — L989 Disorder of the skin and subcutaneous tissue, unspecified: Secondary | ICD-10-CM

## 2015-08-17 DIAGNOSIS — I10 Essential (primary) hypertension: Secondary | ICD-10-CM

## 2015-08-17 DIAGNOSIS — D649 Anemia, unspecified: Secondary | ICD-10-CM

## 2015-08-17 DIAGNOSIS — Z09 Encounter for follow-up examination after completed treatment for conditions other than malignant neoplasm: Secondary | ICD-10-CM

## 2015-08-17 LAB — BASIC METABOLIC PANEL
BUN: 18 mg/dL (ref 6–23)
CO2: 24 mEq/L (ref 19–32)
Calcium: 9.7 mg/dL (ref 8.4–10.5)
Chloride: 110 mEq/L (ref 96–112)
Creatinine, Ser: 0.78 mg/dL (ref 0.40–1.20)
GFR: 79.32 mL/min (ref >60.00)
Glucose, Bld: 133 mg/dL — ABNORMAL HIGH (ref 70–99)
Potassium: 4.1 mEq/L (ref 3.5–5.1)
SODIUM: 143 meq/L (ref 135–145)

## 2015-08-17 LAB — FERRITIN: Ferritin: 7.4 ng/mL — ABNORMAL LOW (ref 10.0–291.0)

## 2015-08-17 LAB — CBC WITH DIFFERENTIAL/PLATELET
BASOS PCT: 0.5 % (ref 0.0–3.0)
Basophils Absolute: 0 10*3/uL (ref 0.0–0.1)
EOS PCT: 2.7 % (ref 0.0–5.0)
Eosinophils Absolute: 0.2 10*3/uL (ref 0.0–0.7)
HCT: 35.7 % — ABNORMAL LOW (ref 36.0–46.0)
HEMOGLOBIN: 11.3 g/dL — AB (ref 12.0–15.0)
Lymphocytes Relative: 22.9 % (ref 12.0–46.0)
Lymphs Abs: 2.1 10*3/uL (ref 0.7–4.0)
MCHC: 31.6 g/dL (ref 30.0–36.0)
MCV: 79.3 fl (ref 78.0–100.0)
MONO ABS: 0.7 10*3/uL (ref 0.1–1.0)
MONOS PCT: 7.5 % (ref 3.0–12.0)
Neutro Abs: 6.1 10*3/uL (ref 1.4–7.7)
Neutrophils Relative %: 66.4 % (ref 43.0–77.0)
Platelets: 324 10*3/uL (ref 150.0–400.0)
RBC: 4.49 Mil/uL (ref 3.87–5.11)
RDW: 16.6 % — AB (ref 11.5–15.5)
WBC: 9.3 10*3/uL (ref 4.0–10.5)

## 2015-08-17 LAB — HEMOGLOBIN A1C: Hgb A1c MFr Bld: 7.5 % — ABNORMAL HIGH (ref 4.6–6.5)

## 2015-08-17 LAB — IRON: IRON: 53 ug/dL (ref 42–145)

## 2015-08-17 MED ORDER — ALPRAZOLAM 0.25 MG PO TABS: 0.5000 mg | ORAL_TABLET | Freq: Every evening | ORAL | Status: DC | PRN

## 2015-08-17 MED ORDER — CYCLOBENZAPRINE HCL 10 MG PO TABS: 10.0000 mg | ORAL_TABLET | Freq: Every evening | ORAL | Status: DC | PRN

## 2015-08-17 NOTE — Progress Notes (Addendum)
Subjective:    Patient ID: Julie [name], female    DOB: 06-15-1950, 65 y.o.   MRN: [id number]  DOS:  08/17/2015 Type of visit - description : Routine visit, here with her daughter Interval history: Diabetes, good compliance of medication, CBGs ranged from occasionally in the 40s to the 300s. Admits that sometimes skips meals and tends to overeat afterwards. When her blood sugars are low she has some shaking. Also having a headache, for the last 2 or 3 months, most days, located at the left side, no associated nausea, usually decrease 2-3 hours after she has her coffee in the morning. At the same time has developed left sided neck pain, on and off, no change with moving her head. Denies any gait disorder, bladder or bowel incontinence or focal weakness. Also has a skin lesion on the nose for a few months. Occasional bleeding when she washes her face.   Review of Systems  Denies chest pain or difficulty breathing  Past Medical History  Diagnosis Date  . Anxiety and depression   . Migraines   . Hypertension 2011  . History of kidney stones   . Osteoporosis   . DJD (degenerative joint disease)   . GERD (gastroesophageal reflux disease)   . Type 2 diabetes mellitus ([place])   . History of gastric ulcer     w/  bleeding-  05/ 2015  . Right ureteral stone   . Nephrolithiasis     bilateral    Past Surgical History  Procedure Laterality Date  . Cholecystectomy  2003  . Wisdom tooth extraction    . Esophagogastroduodenoscopy N/A 10/01/2013    Procedure: ESOPHAGOGASTRODUODENOSCOPY (EGD);  Surgeon: Milus Banister, MD;  Location: [facility];  Service: Endoscopy;  Laterality: N/A;    Social History   Social History  . Marital Status: Married    Spouse Name: marteen cata  . Number of Children: 1  . Years of Education: N/A   Occupational History  . not working     Social History Main Topics  . Smoking status: Former Smoker    Types: Cigarettes    Quit date: 05/23/1979  .  Smokeless tobacco: Never Used  . Alcohol Use: No     Comment: ocass  . Drug Use: No  . Sexual Activity: No   Other Topics Concern  . Not on file   Social History Narrative   Husband had a stroke        Medication List       This list is accurate as of: 08/17/15  6:06 PM.  Always use your most recent med list.               acetaminophen 500 MG tablet  Commonly known as:  TYLENOL  Take 500 mg by mouth every 6 (six) hours as needed for headache.     ALPRAZolam 0.25 MG tablet  Commonly known as:  XANAX  Take 2 tablets (0.5 mg total) by mouth at bedtime as needed for anxiety.     aspirin 81 MG tablet  Take 81 mg by mouth daily.     atorvastatin 10 MG tablet  Commonly known as:  LIPITOR  Take 1 tablet (10 mg total) by mouth daily.     cyclobenzaprine 10 MG tablet  Commonly known as:  FLEXERIL  Take 1 tablet (10 mg total) by mouth at bedtime as needed for muscle spasms.     diltiazem 240 MG 24 hr capsule  Commonly known as:  CARDIZEM CD  Take 1 capsule (240 mg total) by mouth daily.     fenofibrate micronized 200 MG capsule  Commonly known as:  LOFIBRA  Take 1 capsule (200 mg total) by mouth daily before breakfast.     glimepiride 4 MG tablet  Commonly known as:  AMARYL  Take 1.5 tablets (6 mg total) by mouth daily before breakfast.     metFORMIN 1000 MG tablet  Commonly known as:  GLUCOPHAGE  Take 1 tablet (1,000 mg total) by mouth 2 (two) times daily with a meal.     PARoxetine 40 MG tablet  Commonly known as:  PAXIL  Take 1 tablet (40 mg total) by mouth every morning.     zolmitriptan 5 MG tablet  Commonly known as:  ZOMIG  Take 1 tablet (5 mg total) by mouth as needed for migraine.           Objective:   Physical Exam BP 122/74 mmHg  Pulse 94  Temp(Src) 98 F (36.7 C) (Oral)  Ht 5\' 7"  (1.702 m)  Wt 165 lb 4 oz (74.957 kg)  BMI 25.88 kg/m2  SpO2 97% General:   Well developed, well nourished . NAD.  HEENT:  Normocephalic . Face symmetric,  atraumatic Neck: No TTP at the cervical spine, range of motion normal. Lungs:  CTA B Normal respiratory effort, no intercostal retractions, no accessory muscle use. Heart: RRR,  no murmur.  No pretibial edema bilaterally  Skin:  Very superficial scoration at the tip of the nose DIABETIC FEET EXAM: No lower extremity edema Normal pedal pulses bilaterally Skin normal, nails normal, no calluses Pinprick examination of the feet normal. Neurologic:  alert & oriented X3.  Speech normal, gait appropriate for age and unassisted DTRs symmetric, EOMI. Motor symmetric Psych--  Cognition and judgment appear intact.  Cooperative with normal attention span and concentration.  Behavior appropriate. No anxious or depressed appearing.      Assessment & Plan:   Assessment > DM   Hypertension Anxiety depression Migraine headaches DJD Nephrolithiasis bilateral GI --GERD --Prepyloric gastric ulcer 09-2013, DNKA for GI f/u --anemia since at least 2013, iron normal 2014, iron deficient per labs 06-2014 ; no scopes (cost is an issue) Osteoporosis: per DEXA 10/2011, Boniva was rx by gynecology 10/2011  Plan: DM: Has very variable blood sugars, likely related to an uneven diet. Will check a A1c, she may benefit from Januvia or invokana  instead of glimepiride (could help decrease her fluctuating CBGs) but cost will be an issue. Recommend to see ophthalmology. Start aspirin daily. HTN: Controlled, check a BMP Urolithiasis: Ultrasound 03-2015 negative. Anxiety depression: On Xanax, refill as needed Anemia: Chronic, iron deficient, history of prepyloric ulcer, unable to follow-up with GI. Plan: recheck labs Headache: Left-sided headache associated with neck pain without radiculopathy. Cervicogenic? Neuro  exam normal. Trial with Tylenol and Flexeril. If no better will need further eval. Skin lesion: Refer to dermatology RTC 3 months

## 2015-08-17 NOTE — Patient Instructions (Addendum)
GO TO THE LAB :      Get the blood work     GO TO THE FRONT DESK  Schedule your next appointment for a  Check up    When?   3 months Fasting?  Yes    Start an aspirin every day  See an eye doctor for diabetes  For headache: Flexeril at night Take Tylenol during the day If you are not improving gradually in the next few weeks please let me know

## 2015-08-17 NOTE — Assessment & Plan Note (Addendum)
DM: Has very variable blood sugars, likely related to an uneven diet. Will check a A1c, she may benefit from Januvia or invokana  instead of glimepiride (could help decrease her fluctuating CBGs) but cost will be an issue. Recommend to see ophthalmology. Start aspirin daily. HTN: Controlled, check a BMP Urolithiasis: Ultrasound 03-2015 negative. Anxiety depression: On Xanax, refill as needed Anemia: Chronic, iron deficient, history of prepyloric ulcer, unable to follow-up with GI. Plan: recheck labs Headache: Left-sided headache associated with neck pain without radiculopathy. Cervicogenic? Neuro  exam normal. Trial with Tylenol and Flexeril. If no better will need further eval. Skin lesion: Refer to dermatology RTC 3 months

## 2015-08-17 NOTE — Progress Notes (Signed)
Pre visit review using our clinic review tool, if applicable. No additional management support is needed unless otherwise documented below in the visit note. 

## 2015-08-19 MED ORDER — SITAGLIPTIN PHOSPHATE 100 MG PO TABS: 100.0000 mg | ORAL_TABLET | Freq: Every day | ORAL | Status: DC

## 2015-08-19 NOTE — Addendum Note (Signed)
Addended byDamita Dunnings D on: 08/19/2015 02:47 PM   Modules accepted: Orders

## 2015-08-30 ENCOUNTER — Telehealth: Payer: Self-pay

## 2015-08-30 NOTE — Telephone Encounter (Signed)
UDS: 08/17/2015  Negative for Alprazolam:PRN Positive for Temazepam   Low risk per Dr. Larose Kells 08/30/2015

## 2015-10-18 ENCOUNTER — Other Ambulatory Visit: Payer: Self-pay | Admitting: Internal Medicine

## 2015-11-18 ENCOUNTER — Other Ambulatory Visit: Payer: Self-pay | Admitting: Internal Medicine

## 2015-11-18 ENCOUNTER — Ambulatory Visit: Payer: Self-pay | Admitting: Internal Medicine

## 2015-11-18 ENCOUNTER — Telehealth: Payer: Self-pay

## 2015-11-18 NOTE — Telephone Encounter (Signed)
Rx faxed to [facility]  

## 2015-11-18 NOTE — Telephone Encounter (Signed)
Dismissal letter and form completed signed, and placed on Jordan's desk.

## 2015-11-18 NOTE — Telephone Encounter (Signed)
Per Dr. Larose Kells okay 30 day supply, no further refills. Pt being dismissed from care. Rx printed, awaiting MD signature.

## 2015-11-18 NOTE — Telephone Encounter (Signed)
Please a start dismissal process

## 2015-11-18 NOTE — Telephone Encounter (Signed)
Would you like to begin dismissal process?  Multiple No shows and cancellations:  11/18/2015- No Show Follow-up 01/15/2015-No Show Follow-up 09/09/2014-No Show-acute 09/02/2014-No Show-Follow-up  Please advise.

## 2015-11-25 ENCOUNTER — Telehealth: Payer: Self-pay | Admitting: Internal Medicine

## 2015-11-25 NOTE — Telephone Encounter (Signed)
Patient dismissed from [facility] by [name] MD , effective November 18, 2015. Dismissal letter sent out by certified / registered mail. DAJ

## 2015-11-30 NOTE — Telephone Encounter (Signed)
Certified mail signed by patient on 11/29/2015 delivered. Article Number [id number]  Sent to [facility] to be scanned to epic  CLN

## 2015-11-30 NOTE — Telephone Encounter (Signed)
Dr. Paz removed from PCP.  

## 2015-11-30 NOTE — Telephone Encounter (Signed)
Thx

## 2015-12-01 NOTE — Telephone Encounter (Signed)
Pt called in because she received her dismissal letter. Pt says on 2015/02/11 - she had death in her family, and didn't think to call. The others pt says that she isn't aware of. Pt says that she has been with Dr. Larose Kells for a while, she doesn't want to be dismissed from him.   She would like to speak with someone further if possible.

## 2015-12-01 NOTE — Telephone Encounter (Signed)
I will be happy to call the patient but this is ultimately Dr. Ethel Rana decision, does he still want to move forward with the dismissal?

## 2015-12-01 NOTE — Telephone Encounter (Signed)
Multiple no shows, proceed with discharge.

## 2015-12-08 ENCOUNTER — Encounter: Payer: Self-pay | Admitting: Internal Medicine

## 2015-12-27 NOTE — Telephone Encounter (Signed)
Pt states she never received a call back regarding her dismissal from the practice, states she was told that Dr. Larose Kells will write her prescriptions for 30 days until she found a new doctor, however pt is disputed the no-shows and wanted to talk with someone directly regarding it. Pt states she wants to keep dr. Larose Kells as her primary

## 2015-12-27 NOTE — Telephone Encounter (Signed)
Forwarding to [place], Engineer, building services.

## 2015-12-28 NOTE — Telephone Encounter (Signed)
Relation to [id number] Call back [place] :  [place], [place] [phone] (Phone) [phone] (Fax)      Reason for call:  Patient calling back following up on message below regarding multiple no shows. Patient in addition is requesting medication refill for all medications. Please advise.

## 2015-12-28 NOTE — Telephone Encounter (Signed)
Telephone note was forwarded to [place], Engineer, building services yesterday (12/27/2015).

## 2015-12-29 NOTE — Telephone Encounter (Signed)
Patient calling back inquiring about medication refills. Please advise

## 2016-06-06 ENCOUNTER — Other Ambulatory Visit: Payer: Self-pay | Admitting: Internal Medicine

## 2016-10-05 ENCOUNTER — Telehealth: Payer: Self-pay | Admitting: *Deleted

## 2016-10-05 NOTE — Telephone Encounter (Signed)
Received request for Medical records from [facility]; patient dismissed from practice 11/18/15, forwarded to [place] for email/scan/SLS

## 2019-06-26 ENCOUNTER — Other Ambulatory Visit: Payer: Self-pay | Admitting: Family Medicine

## 2019-06-26 DIAGNOSIS — G8929 Other chronic pain: Secondary | ICD-10-CM

## 2019-06-26 DIAGNOSIS — M545 Low back pain, unspecified: Secondary | ICD-10-CM

## 2019-06-26 DIAGNOSIS — M5416 Radiculopathy, lumbar region: Secondary | ICD-10-CM

## 2019-07-01 ENCOUNTER — Emergency Department (HOSPITAL_COMMUNITY): Payer: Medicare Other

## 2019-07-01 ENCOUNTER — Inpatient Hospital Stay (HOSPITAL_COMMUNITY)
Admission: EM | Admit: 2019-07-01 | Discharge: 2019-07-05 | DRG: 811 | Disposition: A | Discharge status: Home Health | Payer: Medicare Other | Attending: Internal Medicine | Admitting: Internal Medicine

## 2019-07-01 ENCOUNTER — Encounter (HOSPITAL_COMMUNITY): Payer: Self-pay | Admitting: Emergency Medicine

## 2019-07-01 ENCOUNTER — Other Ambulatory Visit: Payer: Self-pay

## 2019-07-01 DIAGNOSIS — I82409 Acute embolism and thrombosis of unspecified deep veins of unspecified lower extremity: Secondary | ICD-10-CM

## 2019-07-01 DIAGNOSIS — I82461 Acute embolism and thrombosis of right calf muscular vein: Secondary | ICD-10-CM | POA: Diagnosis present

## 2019-07-01 DIAGNOSIS — D473 Essential (hemorrhagic) thrombocythemia: Secondary | ICD-10-CM | POA: Diagnosis present

## 2019-07-01 DIAGNOSIS — R4182 Altered mental status, unspecified: Secondary | ICD-10-CM | POA: Diagnosis not present

## 2019-07-01 DIAGNOSIS — Z8041 Family history of malignant neoplasm of ovary: Secondary | ICD-10-CM

## 2019-07-01 DIAGNOSIS — E119 Type 2 diabetes mellitus without complications: Secondary | ICD-10-CM | POA: Diagnosis present

## 2019-07-01 DIAGNOSIS — M79609 Pain in unspecified limb: Secondary | ICD-10-CM

## 2019-07-01 DIAGNOSIS — Z7982 Long term (current) use of aspirin: Secondary | ICD-10-CM

## 2019-07-01 DIAGNOSIS — F419 Anxiety disorder, unspecified: Secondary | ICD-10-CM | POA: Diagnosis present

## 2019-07-01 DIAGNOSIS — I10 Essential (primary) hypertension: Secondary | ICD-10-CM | POA: Diagnosis present

## 2019-07-01 DIAGNOSIS — I700 Atherosclerosis of aorta: Secondary | ICD-10-CM | POA: Diagnosis present

## 2019-07-01 DIAGNOSIS — K269 Duodenal ulcer, unspecified as acute or chronic, without hemorrhage or perforation: Secondary | ICD-10-CM | POA: Diagnosis present

## 2019-07-01 DIAGNOSIS — Z87442 Personal history of urinary calculi: Secondary | ICD-10-CM

## 2019-07-01 DIAGNOSIS — K259 Gastric ulcer, unspecified as acute or chronic, without hemorrhage or perforation: Secondary | ICD-10-CM | POA: Diagnosis present

## 2019-07-01 DIAGNOSIS — I824Z1 Acute embolism and thrombosis of unspecified deep veins of right distal lower extremity: Secondary | ICD-10-CM

## 2019-07-01 DIAGNOSIS — D509 Iron deficiency anemia, unspecified: Secondary | ICD-10-CM | POA: Diagnosis not present

## 2019-07-01 DIAGNOSIS — Z833 Family history of diabetes mellitus: Secondary | ICD-10-CM

## 2019-07-01 DIAGNOSIS — I471 Supraventricular tachycardia: Secondary | ICD-10-CM | POA: Diagnosis not present

## 2019-07-01 DIAGNOSIS — Z8049 Family history of malignant neoplasm of other genital organs: Secondary | ICD-10-CM

## 2019-07-01 DIAGNOSIS — I160 Hypertensive urgency: Secondary | ICD-10-CM | POA: Diagnosis present

## 2019-07-01 DIAGNOSIS — E860 Dehydration: Secondary | ICD-10-CM | POA: Diagnosis not present

## 2019-07-01 DIAGNOSIS — E870 Hyperosmolality and hypernatremia: Secondary | ICD-10-CM | POA: Diagnosis present

## 2019-07-01 DIAGNOSIS — M810 Age-related osteoporosis without current pathological fracture: Secondary | ICD-10-CM | POA: Diagnosis present

## 2019-07-01 DIAGNOSIS — G9340 Encephalopathy, unspecified: Secondary | ICD-10-CM | POA: Diagnosis not present

## 2019-07-01 DIAGNOSIS — K449 Diaphragmatic hernia without obstruction or gangrene: Secondary | ICD-10-CM | POA: Diagnosis present

## 2019-07-01 DIAGNOSIS — Z87891 Personal history of nicotine dependence: Secondary | ICD-10-CM

## 2019-07-01 DIAGNOSIS — Z8249 Family history of ischemic heart disease and other diseases of the circulatory system: Secondary | ICD-10-CM

## 2019-07-01 DIAGNOSIS — K2100 Gastro-esophageal reflux disease with esophagitis, without bleeding: Secondary | ICD-10-CM | POA: Diagnosis present

## 2019-07-01 DIAGNOSIS — Z7984 Long term (current) use of oral hypoglycemic drugs: Secondary | ICD-10-CM

## 2019-07-01 DIAGNOSIS — Z20822 Contact with and (suspected) exposure to covid-19: Secondary | ICD-10-CM | POA: Diagnosis present

## 2019-07-01 DIAGNOSIS — Z79899 Other long term (current) drug therapy: Secondary | ICD-10-CM

## 2019-07-01 DIAGNOSIS — Z800 Family history of malignant neoplasm of digestive organs: Secondary | ICD-10-CM

## 2019-07-01 DIAGNOSIS — F329 Major depressive disorder, single episode, unspecified: Secondary | ICD-10-CM | POA: Diagnosis present

## 2019-07-01 DIAGNOSIS — Z818 Family history of other mental and behavioral disorders: Secondary | ICD-10-CM

## 2019-07-01 DIAGNOSIS — Z886 Allergy status to analgesic agent status: Secondary | ICD-10-CM

## 2019-07-01 DIAGNOSIS — N200 Calculus of kidney: Secondary | ICD-10-CM | POA: Diagnosis present

## 2019-07-01 DIAGNOSIS — Z803 Family history of malignant neoplasm of breast: Secondary | ICD-10-CM

## 2019-07-01 DIAGNOSIS — M545 Low back pain, unspecified: Secondary | ICD-10-CM

## 2019-07-01 DIAGNOSIS — Z8711 Personal history of peptic ulcer disease: Secondary | ICD-10-CM

## 2019-07-01 DIAGNOSIS — Z993 Dependence on wheelchair: Secondary | ICD-10-CM

## 2019-07-01 DIAGNOSIS — D735 Infarction of spleen: Secondary | ICD-10-CM | POA: Diagnosis present

## 2019-07-01 DIAGNOSIS — Z8349 Family history of other endocrine, nutritional and metabolic diseases: Secondary | ICD-10-CM

## 2019-07-01 DIAGNOSIS — Z9049 Acquired absence of other specified parts of digestive tract: Secondary | ICD-10-CM

## 2019-07-01 DIAGNOSIS — E785 Hyperlipidemia, unspecified: Secondary | ICD-10-CM | POA: Diagnosis present

## 2019-07-01 DIAGNOSIS — M1990 Unspecified osteoarthritis, unspecified site: Secondary | ICD-10-CM | POA: Diagnosis present

## 2019-07-01 DIAGNOSIS — K222 Esophageal obstruction: Secondary | ICD-10-CM | POA: Diagnosis present

## 2019-07-01 DIAGNOSIS — G9341 Metabolic encephalopathy: Secondary | ICD-10-CM | POA: Diagnosis present

## 2019-07-01 LAB — URINALYSIS, ROUTINE W REFLEX MICROSCOPIC
Bilirubin Urine: NEGATIVE
Glucose, UA: NEGATIVE mg/dL
Ketones, ur: NEGATIVE mg/dL
Nitrite: NEGATIVE
Protein, ur: NEGATIVE mg/dL
Specific Gravity, Urine: 1.025 (ref 1.005–1.030)
pH: 5 (ref 5.0–8.0)

## 2019-07-01 LAB — COMPREHENSIVE METABOLIC PANEL
ALT: 26 U/L (ref 0–44)
AST: 20 U/L (ref 15–41)
Albumin: 3.8 g/dL (ref 3.5–5.0)
Alkaline Phosphatase: 51 U/L (ref 38–126)
Anion gap: 8 (ref 5–15)
BUN: 14 mg/dL (ref 8–23)
CO2: 21 mmol/L — ABNORMAL LOW (ref 22–32)
Calcium: 9.9 mg/dL (ref 8.9–10.3)
Chloride: 118 mmol/L — ABNORMAL HIGH (ref 98–111)
Creatinine, Ser: 0.83 mg/dL (ref 0.44–1.00)
GFR calc Af Amer: >60 mL/min (ref >60)
GFR calc non Af Amer: >60 mL/min (ref >60)
Glucose, Bld: 136 mg/dL — ABNORMAL HIGH (ref 70–99)
Potassium: 3.9 mmol/L (ref 3.5–5.1)
Sodium: 147 mmol/L — ABNORMAL HIGH (ref 135–145)
Total Bilirubin: 0.8 mg/dL (ref 0.3–1.2)
Total Protein: 7.2 g/dL (ref 6.5–8.1)

## 2019-07-01 LAB — CBC WITH DIFFERENTIAL/PLATELET
Abs Immature Granulocytes: 0.06 10*3/uL (ref 0.00–0.07)
Basophils Absolute: 0 10*3/uL (ref 0.0–0.1)
Basophils Relative: 1 %
Eosinophils Absolute: 0.3 10*3/uL (ref 0.0–0.5)
Eosinophils Relative: 4 %
HCT: 29.5 % — ABNORMAL LOW (ref 36.0–46.0)
Hemoglobin: 7.5 g/dL — ABNORMAL LOW (ref 12.0–15.0)
Immature Granulocytes: 1 %
Lymphocytes Relative: 21 %
Lymphs Abs: 1.6 10*3/uL (ref 0.7–4.0)
MCH: 18.4 pg — ABNORMAL LOW (ref 26.0–34.0)
MCHC: 25.4 g/dL — ABNORMAL LOW (ref 30.0–36.0)
MCV: 72.5 fL — ABNORMAL LOW (ref 80.0–100.0)
Monocytes Absolute: 0.6 10*3/uL (ref 0.1–1.0)
Monocytes Relative: 8 %
Neutro Abs: 5 10*3/uL (ref 1.7–7.7)
Neutrophils Relative %: 65 %
Platelets: 420 10*3/uL — ABNORMAL HIGH (ref 150–400)
RBC: 4.07 MIL/uL (ref 3.87–5.11)
RDW: 19.2 % — ABNORMAL HIGH (ref 11.5–15.5)
WBC: 7.6 10*3/uL (ref 4.0–10.5)
nRBC: 0 % (ref 0.0–0.2)

## 2019-07-01 LAB — POC OCCULT BLOOD, ED: Fecal Occult Bld: NEGATIVE

## 2019-07-01 LAB — IRON AND TIBC
Iron: 20 ug/dL — ABNORMAL LOW (ref 28–170)
Saturation Ratios: 3 % — ABNORMAL LOW (ref 10.4–31.8)
TIBC: 616 ug/dL — ABNORMAL HIGH (ref 250–450)
UIBC: 596 ug/dL

## 2019-07-01 LAB — RETICULOCYTES
Immature Retic Fract: 25.1 % — ABNORMAL HIGH (ref 2.3–15.9)
RBC.: 4.03 MIL/uL (ref 3.87–5.11)
Retic Count, Absolute: 62.9 10*3/uL (ref 19.0–186.0)
Retic Ct Pct: 1.6 % (ref 0.4–3.1)

## 2019-07-01 LAB — RESPIRATORY PANEL BY RT PCR (FLU A&B, COVID)
Influenza A by PCR: NEGATIVE
Influenza B by PCR: NEGATIVE
SARS Coronavirus 2 by RT PCR: NEGATIVE

## 2019-07-01 LAB — CBG MONITORING, ED
Glucose-Capillary: 130 mg/dL — ABNORMAL HIGH (ref 70–99)
Glucose-Capillary: 155 mg/dL — ABNORMAL HIGH (ref 70–99)

## 2019-07-01 LAB — FOLATE: Folate: 9.8 ng/mL (ref >5.9)

## 2019-07-01 LAB — FERRITIN: Ferritin: 9 ng/mL — ABNORMAL LOW (ref 11–307)

## 2019-07-01 LAB — CK: Total CK: 11 U/L — ABNORMAL LOW (ref 38–234)

## 2019-07-01 LAB — VITAMIN B12: Vitamin B-12: 1254 pg/mL — ABNORMAL HIGH (ref 180–914)

## 2019-07-01 MED ORDER — ASPIRIN EC 81 MG PO TBEC
81.0000 mg | DELAYED_RELEASE_TABLET | Freq: Every day | ORAL | Status: DC
  Administered 2019-07-02: 81 mg via ORAL
  Filled 2019-07-01: qty 1

## 2019-07-01 MED ORDER — ENOXAPARIN SODIUM 40 MG/0.4ML ~~LOC~~ SOLN
40.0000 mg | Freq: Every day | SUBCUTANEOUS | Status: DC
  Administered 2019-07-01: 40 mg via SUBCUTANEOUS
  Filled 2019-07-01: qty 0.4

## 2019-07-01 MED ORDER — SODIUM CHLORIDE 0.9 % IV BOLUS
1000.0000 mL | Freq: Once | INTRAVENOUS | Status: AC
  Administered 2019-07-02: 1000 mL via INTRAVENOUS

## 2019-07-01 MED ORDER — SODIUM CHLORIDE 0.9 % IV BOLUS
1000.0000 mL | Freq: Once | INTRAVENOUS | Status: AC
  Administered 2019-07-01: 1000 mL via INTRAVENOUS

## 2019-07-01 MED ORDER — OXYCODONE-ACETAMINOPHEN 5-325 MG PO TABS
1.0000 | ORAL_TABLET | Freq: Four times a day (QID) | ORAL | Status: DC | PRN
  Administered 2019-07-02 – 2019-07-05 (×9): 1 via ORAL
  Filled 2019-07-01 (×9): qty 1

## 2019-07-01 MED ORDER — INSULIN ASPART 100 UNIT/ML ~~LOC~~ SOLN
0.0000 [IU] | Freq: Three times a day (TID) | SUBCUTANEOUS | Status: DC
  Administered 2019-07-03: 1 [IU] via SUBCUTANEOUS
  Administered 2019-07-04: 3 [IU] via SUBCUTANEOUS
  Administered 2019-07-05 (×2): 1 [IU] via SUBCUTANEOUS
  Filled 2019-07-01: qty 0.06

## 2019-07-01 MED ORDER — IOHEXOL 300 MG/ML  SOLN
100.0000 mL | Freq: Once | INTRAMUSCULAR | Status: AC | PRN
  Administered 2019-07-01: 100 mL via INTRAVENOUS

## 2019-07-01 MED ORDER — PAROXETINE HCL 20 MG PO TABS
40.0000 mg | ORAL_TABLET | Freq: Every day | ORAL | Status: DC
  Administered 2019-07-02 – 2019-07-05 (×4): 40 mg via ORAL
  Filled 2019-07-01 (×4): qty 2

## 2019-07-01 MED ORDER — FENOFIBRATE 54 MG PO TABS
54.0000 mg | ORAL_TABLET | Freq: Every day | ORAL | Status: DC
  Administered 2019-07-02 – 2019-07-05 (×4): 54 mg via ORAL
  Filled 2019-07-01 (×4): qty 1

## 2019-07-01 MED ORDER — DILTIAZEM HCL ER COATED BEADS 240 MG PO CP24
240.0000 mg | ORAL_CAPSULE | Freq: Every day | ORAL | Status: DC
  Administered 2019-07-02 – 2019-07-05 (×4): 240 mg via ORAL
  Filled 2019-07-01 (×4): qty 1

## 2019-07-01 MED ORDER — INSULIN ASPART 100 UNIT/ML ~~LOC~~ SOLN
0.0000 [IU] | Freq: Every day | SUBCUTANEOUS | Status: DC
  Administered 2019-07-04: 3 [IU] via SUBCUTANEOUS
  Filled 2019-07-01: qty 0.05

## 2019-07-01 MED ORDER — LOSARTAN POTASSIUM 50 MG PO TABS
25.0000 mg | ORAL_TABLET | Freq: Every day | ORAL | Status: DC
  Administered 2019-07-02 – 2019-07-05 (×4): 25 mg via ORAL
  Filled 2019-07-01 (×4): qty 1

## 2019-07-01 MED ORDER — SODIUM CHLORIDE 0.45 % IV SOLN: INTRAVENOUS | Status: DC

## 2019-07-01 MED ORDER — ATORVASTATIN CALCIUM 10 MG PO TABS
10.0000 mg | ORAL_TABLET | Freq: Every day | ORAL | Status: DC
  Administered 2019-07-02 – 2019-07-05 (×4): 10 mg via ORAL
  Filled 2019-07-01 (×4): qty 1

## 2019-07-01 MED ORDER — FREE WATER
200.0000 mL | Status: DC
  Administered 2019-07-02 (×2): 200 mL

## 2019-07-01 MED ORDER — OXYCODONE HCL ER 10 MG PO T12A
10.0000 mg | EXTENDED_RELEASE_TABLET | Freq: Two times a day (BID) | ORAL | Status: AC
  Administered 2019-07-01 – 2019-07-03 (×4): 10 mg via ORAL
  Filled 2019-07-01 (×4): qty 1

## 2019-07-01 NOTE — H&P (Signed)
History and Physical  Julie [name] KDT:[id number] DOB: February 22, 1951 DOA: 07/01/2019  Referring physician: ER provider PCP: Levy Pupa, MD  Outpatient Specialists:    Patient coming from: Home  Chief Complaint: Right lower extremity pain  HPI:  Patient is a 69 year old Caucasian female past medical history significant for diabetes mellitus type 2, nephrolithiasis, hypertension, gastric ulcer, DJD, anxiety and depression.  Lately, patient has had a problem with reported sciatica for which patient has been undergoing deep tissue massage according to patient's daughter.  Patient's primary care providers have also prescribed some mind altering medications lately.  Patient presented with worsening right lower extremity pain that started last night.  According to the patient, she was not able to sleep all night due to the the severity of the right lower extremity pain.  Patient has been on muscle relaxant, Phenergan and other medications.  Patient has not had imaging of the lumbosacral region.  On the way to the hospital today, patient's daughter reported that patient was a bit altered.  Patient's daughter could not give much details about the reported altered mentation, except that patient was said to be briefly absent minded.  Patient explains the observed alteration as result of the severe right lower extremity pain.  No fever or chills.  No headache, no neck pain, no URI symptoms, no chest pain, no shortness of breath, no urinary symptoms.  Patient has had poor p.o. intake.  Patient is dehydrated.  Pertinent lab work done so far revealed sodium of 147, chloride 118, CO2 21, BUN of 14, serum creatinine of 0.83 with a blood sugar of 136.  Hemoglobin is 7.5 g/dL with platelet count of 420.  UA reveals a specific gravity of 1.025.  CT head and MRI brain have not revealed any acute findings.  Hospitalist team has been asked to admit patient for further assessment and management.  ED Course: On presentation to  the hospital, temperature was 98.8, blood pressure 181/85 mmHg, heart rate of 91 and respiratory rate of 21.  O2 sat is 96%.  Patient has been worked up extensively, including MRI brain, CT of the abdomen pelvis with contrast, vascular ultrasound of the right lower extremity and CT head.  Official report of the vascular ultrasound of the right lower extremity is pending  Pertinent labs: As documented above.  Imaging: independently reviewed.   Review of Systems:  Negative for fever, visual changes, sore throat, rash, chest pain, SOB, dysuria, bleeding, n/v/abdominal pain.  Past Medical History:  Diagnosis Date  . Anxiety and depression   . DJD (degenerative joint disease)   . GERD (gastroesophageal reflux disease)   . History of gastric ulcer    w/  bleeding-  05/ 2015  . History of kidney stones   . Hypertension 2011  . Migraines   . Nephrolithiasis    bilateral  . Osteoporosis   . Right ureteral stone   . Type 2 diabetes mellitus ([place])     Past Surgical History:  Procedure Laterality Date  . CHOLECYSTECTOMY  2003  . ESOPHAGOGASTRODUODENOSCOPY N/A 10/01/2013   Procedure: ESOPHAGOGASTRODUODENOSCOPY (EGD);  Surgeon: Milus Banister, MD;  Location: [facility];  Service: Endoscopy;  Laterality: N/A;  . WISDOM TOOTH EXTRACTION       reports that she quit smoking about 40 years ago. Her smoking use included cigarettes. She has never used smokeless tobacco. She reports that she does not drink alcohol or use drugs.  No Known Allergies  Family History  Problem Relation Age of Onset  .  Cancer Mother        ovary/uterus cancer  . Hyperlipidemia Mother        M and F  . Heart disease Mother        M had CHF  . Hypertension Mother        Jerilynn Mages and F  . Diabetes Father   . Mental illness Maternal Grandmother   . Hypertension Maternal Grandmother   . Breast cancer Maternal Grandmother   . Mental illness Maternal Grandfather   . Hypertension Maternal Grandfather   . Stomach cancer  Maternal Grandfather   . Mental illness Paternal Grandmother   . Hypertension Paternal Grandmother   . Breast cancer Paternal Grandmother   . Mental illness Paternal Grandfather   . Hypertension Paternal Grandfather   . Colon cancer Neg Hx      Prior to Admission medications   Medication Sig Start Date End Date Taking? Authorizing Provider  acetaminophen (TYLENOL) 500 MG tablet Take 500 mg by mouth every 6 (six) hours as needed for headache.   Yes [provider]  ALPRAZolam (XANAX) 0.25 MG tablet Take 2 tablets (0.5 mg total) by mouth at bedtime as needed for anxiety. 11/18/15  Yes Colon Branch, MD  aspirin 81 MG tablet Take 81 mg by mouth daily.   Yes [provider]  atorvastatin (LIPITOR) 10 MG tablet Take 1 tablet (10 mg total) by mouth daily. 07/06/15  Yes Paz, Alda Berthold, MD  buPROPion (WELLBUTRIN XL) 150 MG 24 hr tablet Take 75 mg by mouth daily.  04/23/19  Yes [provider]  diltiazem (CARTIA XT) 240 MG 24 hr capsule Take 1 capsule (240 mg total) by mouth daily. 10/19/15  Yes Paz, Alda Berthold, MD  fenofibrate micronized (LOFIBRA) 200 MG capsule Take 1 capsule (200 mg total) by mouth daily before breakfast. 07/01/15  Yes Paz, Alda Berthold, MD  glimepiride (AMARYL) 4 MG tablet Take 1.5 tablets (6 mg total) by mouth daily with breakfast. 11/18/15  Yes Colon Branch, MD  ibuprofen (ADVIL) 200 MG tablet Take 200 mg by mouth every 6 (six) hours as needed for headache or moderate pain.   Yes [provider]  losartan (COZAAR) 50 MG tablet Take 25 mg by mouth daily.  04/22/19  Yes [provider]  metFORMIN (GLUCOPHAGE) 1000 MG tablet Take 1 tablet (1,000 mg total) by mouth 2 (two) times daily with a meal. 11/18/15  Yes Paz, Alda Berthold, MD  PARoxetine (PAXIL) 40 MG tablet Take 1 tablet (40 mg total) by mouth daily. 10/19/15  Yes Paz, Alda Berthold, MD  cyclobenzaprine (FLEXERIL) 10 MG tablet Take 1 tablet (10 mg total) by mouth at bedtime as needed for muscle spasms. Patient not  taking: Reported on 07/01/2019 08/17/15   Colon Branch, MD  sitaGLIPtin (JANUVIA) 100 MG tablet Take 1 tablet (100 mg total) by mouth daily. Patient not taking: Reported on 07/01/2019 08/19/15   Colon Branch, MD  zolmitriptan (ZOMIG) 5 MG tablet Take 1 tablet (5 mg total) by mouth as needed for migraine. Patient not taking: Reported on 08/17/2015 04/07/15   Colon Branch, MD    Physical Exam: Vitals:   07/01/19 1800 07/01/19 1815 07/01/19 1830 07/01/19 2100  BP: (!) 180/87  (!) 184/88 (!) 181/85  Pulse: (!) 104 89 (!) 107 91  Resp: (!) 27 (!) 21 18 (!) 21  Temp:      TempSrc:      SpO2: 100% 98% 100% 96%  Weight:  Height:        Constitutional:  . Appears calm and comfortable.  Patient seems to be in mild painful distress. Eyes:  . Pallor. No jaundice.  ENMT:  . external ears, nose appear normal.  Dry buccal mucosa. Neck:  . Neck is supple. No JVD Respiratory:  . CTA bilaterally, no w/r/r.  . Respiratory effort normal. No retractions or accessory muscle use Cardiovascular:  . S1S2 . No LE extremity edema   Abdomen:  . Abdomen is soft and non tender. Organs are difficult to assess. Neurologic:  . Awake and alert. . Moves all limbs.  Wt Readings from Last 3 Encounters:  07/01/19 74.8 kg  08/17/15 75 kg  03/17/15 75.1 kg    I have personally reviewed following labs and imaging studies  Labs on Admission:  CBC: Recent Labs  Lab 07/01/19 1641  WBC 7.6  NEUTROABS 5.0  HGB 7.5*  HCT 29.5*  MCV 72.5*  PLT 086*   Basic Metabolic Panel: Recent Labs  Lab 07/01/19 1641  NA 147*  K 3.9  CL 118*  CO2 21*  GLUCOSE 136*  BUN 14  CREATININE 0.83  CALCIUM 9.9   Liver Function Tests: Recent Labs  Lab 07/01/19 1641  AST 20  ALT 26  ALKPHOS 51  BILITOT 0.8  PROT 7.2  ALBUMIN 3.8   No results for input(s): LIPASE, AMYLASE in the last 168 hours. No results for input(s): AMMONIA in the last 168 hours. Coagulation Profile: No results for input(s): INR, PROTIME  in the last 168 hours. Cardiac Enzymes: Recent Labs  Lab 07/01/19 1641  CKTOTAL 11*   BNP (last 3 results) No results for input(s): PROBNP in the last 8760 hours. HbA1C: No results for input(s): HGBA1C in the last 72 hours. CBG: Recent Labs  Lab 07/01/19 1646  GLUCAP 130*   Lipid Profile: No results for input(s): CHOL, HDL, LDLCALC, TRIG, CHOLHDL, LDLDIRECT in the last 72 hours. Thyroid Function Tests: No results for input(s): TSH, T4TOTAL, FREET4, T3FREE, THYROIDAB in the last 72 hours. Anemia Panel: Recent Labs    07/01/19 1641  VITAMINB12 1,254*  FOLATE 9.8  FERRITIN 9*  TIBC 616*  IRON 20*  RETICCTPCT 1.6   Urine analysis:    Component Value Date/Time   COLORURINE STRAW (A) 07/01/2019 2147   APPEARANCEUR CLEAR 07/01/2019 2147   LABSPEC 1.025 07/01/2019 2147   PHURINE 5.0 07/01/2019 2147   GLUCOSEU NEGATIVE 07/01/2019 2147   GLUCOSEU NEGATIVE 03/23/2014 1436   HGBUR SMALL (A) 07/01/2019 2147   BILIRUBINUR NEGATIVE 07/01/2019 2147   BILIRUBINUR 1 11/15/2011 1530   KETONESUR NEGATIVE 07/01/2019 2147   PROTEINUR NEGATIVE 07/01/2019 2147   UROBILINOGEN 0.2 11/23/2014 1342   NITRITE NEGATIVE 07/01/2019 2147   LEUKOCYTESUR TRACE (A) 07/01/2019 2147   Sepsis Labs: _0 (procalcitonin:4,lacticidven:4) ) Recent Results (from the past 240 hour(s))  Respiratory Panel by RT PCR (Flu A&B, Covid) - Nasopharyngeal Swab     Status: None   Collection Time: 07/01/19  5:00 PM   Specimen: Nasopharyngeal Swab  Result Value Ref Range Status   SARS Coronavirus 2 by RT PCR NEGATIVE NEGATIVE Final    Comment: (NOTE) SARS-CoV-2 target nucleic acids are NOT DETECTED. The SARS-CoV-2 RNA is generally detectable in upper respiratoy specimens during the acute phase of infection. The lowest concentration of SARS-CoV-2 viral copies this assay can detect is 131 copies/mL. A negative result does not preclude SARS-Cov-2 infection and should not be used as the sole basis for  treatment or other patient  management decisions. A negative result may occur with  improper specimen collection/handling, submission of specimen other than nasopharyngeal swab, presence of viral mutation(s) within the areas targeted by this assay, and inadequate number of viral copies (<131 copies/mL). A negative result must be combined with clinical observations, patient history, and epidemiological information. The expected result is Negative. Fact Sheet for Patients:  PinkCheek.be Fact Sheet for Healthcare Providers:  GravelBags.it This test is not yet ap proved or cleared by the [place] FDA and  has been authorized for detection and/or diagnosis of SARS-CoV-2 by FDA under an Emergency Use Authorization (EUA). This EUA will remain  in effect (meaning this test can be used) for the duration of the COVID-19 declaration under Section 564(b)(1) of the Act, 21 U.S.C. section 360bbb-3(b)(1), unless the authorization is terminated or revoked sooner.    Influenza A by PCR NEGATIVE NEGATIVE Final   Influenza B by PCR NEGATIVE NEGATIVE Final    Comment: (NOTE) The Xpert Xpress SARS-CoV-2/FLU/RSV assay is intended as an aid in  the diagnosis of influenza from Nasopharyngeal swab specimens and  should not be used as a sole basis for treatment. Nasal washings and  aspirates are unacceptable for Xpert Xpress SARS-CoV-2/FLU/RSV  testing. Fact Sheet for Patients: PinkCheek.be Fact Sheet for Healthcare Providers: GravelBags.it This test is not yet approved or cleared by the [place] FDA and  has been authorized for detection and/or diagnosis of SARS-CoV-2 by  FDA under an Emergency Use Authorization (EUA). This EUA will remain  in effect (meaning this test can be used) for the duration of the  Covid-19 declaration under Section 564(b)(1) of the Act, 21  U.S.C. section  360bbb-3(b)(1), unless the authorization is  terminated or revoked. Performed at [facility], [place] [address], [place], [place] [address]       Radiological Exams on Admission: DG Tibia/Fibula Right  Result Date: 07/01/2019 CLINICAL DATA:  69 year old female with abrasion on the proximal tibia. EXAM: RIGHT TIBIA AND FIBULA - 2 VIEW COMPARISON:  None. FINDINGS: There is no acute fracture or dislocation. The bones are osteopenic. No significant arthritic changes. The soft tissues are unremarkable. IMPRESSION: Negative. Electronically Signed   By: Anner Crete M.D.   On: 07/01/2019 17:51   CT Head Wo Contrast  Result Date: 07/01/2019 CLINICAL DATA:  Encephalopathy, lethargy, altered level of consciousness, somnolence EXAM: CT HEAD WITHOUT CONTRAST TECHNIQUE: Contiguous axial images were obtained from the base of the skull through the vertex without intravenous contrast. COMPARISON:  None. FINDINGS: Brain: Hypodensities in the bilateral frontal periventricular white matter and bilateral basal ganglia consistent with age-indeterminate small vessel ischemic change. No other signs of acute infarct or hemorrhage. Lateral ventricles and remaining midline structures are unremarkable. No acute extra-axial fluid collections. No mass effect. Vascular: No hyperdense vessel or unexpected calcification. Skull: Normal. Negative for fracture or focal lesion. Sinuses/Orbits: No acute finding. Other: None IMPRESSION: 1. Age-indeterminate small vessel ischemic changes within the bilateral frontal periventricular white matter and basal ganglia, favor chronic. 2. Otherwise unremarkable exam. Electronically Signed   By: Randa Ngo M.D.   On: 07/01/2019 18:49   MR BRAIN WO CONTRAST  Result Date: 07/01/2019 CLINICAL DATA:  Encephalopathy EXAM: MRI HEAD WITHOUT CONTRAST TECHNIQUE: Multiplanar, multiecho pulse sequences of the brain and surrounding structures were obtained without intravenous  contrast. COMPARISON:  None. FINDINGS: Brain: No acute infarct, acute hemorrhage or extra-axial collection. Multifocal white matter hyperintensity, most commonly due to chronic ischemic microangiopathy. Mild generalized atrophy no chronic microhemorrhage. Normal  midline structures. Vascular: Normal flow voids. Skull and upper cervical spine: Mild-to-moderate narrowing of the upper cervical spinal canal. Sinuses/Orbits: Negative. Other: None. IMPRESSION: 1. No acute intracranial abnormality. 2. Findings of chronic ischemic microangiopathy. 3. Mild-to-moderate narrowing of the upper cervical spinal canal. Electronically Signed   By: Ulyses Jarred M.D.   On: 07/01/2019 22:10   CT ABDOMEN PELVIS W CONTRAST  Result Date: 07/01/2019 CLINICAL DATA:  Flank pain. EXAM: CT ABDOMEN AND PELVIS WITH CONTRAST TECHNIQUE: Multidetector CT imaging of the abdomen and pelvis was performed using the standard protocol following bolus administration of intravenous contrast. CONTRAST:  163m OMNIPAQUE IOHEXOL 300 MG/ML  SOLN COMPARISON:  CT scan 11/23/2014 FINDINGS: Lower chest: The lung bases are clear of an acute process. Basilar scarring changes and a somewhat mosaic pattern attenuation could be due to respiratory bronchiolitis, reactive airways disease or bronchitis. No worrisome pulmonary lesions or pleural effusion. The heart is normal in size. No pericardial effusion. Hepatobiliary: No focal hepatic lesions or intrahepatic biliary dilatation. The gallbladder surgically absent. No common bile duct dilatation. Pancreas: No mass, inflammation or ductal dilatation. Spleen: Low-attenuation lesion in the posterior aspect of the spleen is likely a benign cyst or hemangioma. There is a soft tissue mass between the spleen in the fundal region the stomach. This measures approximately 5.7 x 4.2 x 4.5 cm. Heterogeneous enhancement different than that of the spleen but it has been present since 2013 but is gradually enlarged. It could be an  accessory spleen is undergone some infarcts and cystic change. MRI abdomen without and with contrast is recommended for further evaluation. Adrenals/Urinary Tract: The adrenal glands are unremarkable and stable. Numerous bilateral renal calculi with large staghorn type calculus noted in the lower pole region of the right kidney measuring up to 27 mm. Lower pole calculus on the left measures a maximum of 19.5 mm. No worrisome renal lesions or hydroureteronephrosis. No obstructing ureteral calculi and no bladder calculi. No bladder mass is identified. Mild bilateral renal scarring changes. Stomach/Bowel: Small hiatal hernia but the stomach is otherwise unremarkable. The duodenum, small bowel and colon are unremarkable. No acute inflammatory changes, mass lesions or obstructive findings. The terminal ileum is normal. The appendix is normal. Vascular/Lymphatic: Moderate atherosclerotic calcifications involving the aorta but no aneurysm. The branch vessels are patent. The major venous structures are patent. No mesenteric or retroperitoneal mass or adenopathy. Reproductive: The uterus and ovaries are unremarkable. Other: No pelvic mass or adenopathy. No free pelvic fluid collections. No inguinal mass or adenopathy. No abdominal wall hernia or subcutaneous lesions. Musculoskeletal: No acute bony findings or worrisome bone lesions. Degenerative anterolisthesis of L5 is noted. IMPRESSION: 1. Numerous bilateral renal calculi but no obstructing ureteral calculi or bladder calculi. 2. No acute abdominal/pelvic findings or lymphadenopathy. 3. 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 It has gradually enlarged since 2013 and is unlikely malignant. 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 MRI abdomen without and with contrast is recommended for further evaluation. 4. Status post cholecystectomy. No biliary dilatation. Aortic Atherosclerosis (ICD10-I70.0). Electronically  Signed   By: PMarijo SanesM.D.   On: 07/01/2019 20:58   DG Chest Portable 1 View  Result Date: 07/01/2019 CLINICAL DATA:  Altered mental status. EXAM: PORTABLE CHEST 1 VIEW COMPARISON:  Radiograph 09/30/2013 FINDINGS: The cardiomediastinal contours are normal. Upper normal heart size likely accentuated by portable technique. The lungs are clear. Pulmonary vasculature is normal. No consolidation, pleural effusion, or pneumothorax.  No acute osseous abnormalities are seen. IMPRESSION: No acute chest findings. Electronically Signed   By: Keith Rake M.D.   On: 07/01/2019 17:50    Active Problems:   Acute encephalopathy   Assessment/Plan: Acute encephalopathy: -Likely multifactorial -Patient has had worsening of the right lower extremity pain, and has been up all night according to the patient.  Patient is also dehydrated.  Elevation of the blood pressure is noted.  The patient may have been on several mind altering medications. -Aggressively hydrate patient. -Control patient's pain. -Urine drug screen need -Check ESR and CRP -Reviewed patient's medications.  Avoid mind altering medications as much as possible. -Further management will depend on hospital course  Dehydration: Patient has had poor p.o. intake. Sodium is 147. Start free water and half-normal saline. Monitor sodium level closely.  Elevated blood pressure/hypertensive urgency: -Elevation of the blood pressure may be secondary to pain and other distress. -Control pain -Hydrate patient -Monitor BP closely.  Diabetes mellitus type 2: Start sliding scale insulin coverage Monitor closely.  Right lower extremity pain: Result of the MRI brain is negative. Patient has never had a lumbosacral MRI (this can be done on outpatient basis) Consult PT Consult OT Adequate analgesia  DVT prophylaxis: Subcu Lovenox Code Status: Full code Family Communication: Daughter Disposition Plan: Home eventually Consults called:  None Admission status: Observation  Time spent: 65 minutes  Dana Allan, MD  [facility] Pager #: [phone] 7PM-7AM contact night coverage as above  07/01/2019, 10:39 PM

## 2019-07-01 NOTE — ED Provider Notes (Signed)
[facility] DEPT Provider Note   CSN: [id number] Arrival date & time: 07/01/19  1538     History Chief Complaint  Patient presents with  . Altered Mental Status    Julie [name] is a 69 y.o. female.  Patient with history of diabetes, GERD, hypertension presenting with right leg pain for the past several weeks.  She is complaining of pain to her right lower leg any fall or trauma.  States the pain is been there progressively worsening.  She takes Tylenol for the pain.  She normally uses a motorized wheelchair when she goes long distances but is able to walk.  She denies any weakness in the leg.  There is no numbness or tingling but feels a burning in her foot.  There is no fever, chest pain, shortness of breath.  No vomiting or diarrhea.  Denies any back pain.  There is no bowel or bladder incontinence. No pain with urination or blood in the urine.  There was some concern by patient's daughter that she was altered and subsequently was found this morning.  No fever.  Spoke with patient's daughter Estill Bamberg by phone.  She states the patient has had decreased mental status since this morning and seems to be very slow to respond.  Took her 15 minutes to put her shirt on this morning.  She has been "acting spacey and out of it" and taking a long time to answer questions.  Her last seen normal was last night.  The history is provided by the patient.  Altered Mental Status Associated symptoms: weakness   Associated symptoms: no abdominal pain, no headaches, no nausea, no rash and no vomiting        Past Medical History:  Diagnosis Date  . Anxiety and depression   . DJD (degenerative joint disease)   . GERD (gastroesophageal reflux disease)   . History of gastric ulcer    w/  bleeding-  05/ 2015  . History of kidney stones   . Hypertension 2011  . Migraines   . Nephrolithiasis    bilateral  . Osteoporosis   . Right ureteral stone   . Type 2 diabetes  mellitus [facility])     Patient Active Problem List   Diagnosis Date Noted  . PCP NOTES >>> 03/18/2015  . Gastric ulcer 10/01/2013  . Palpitations 09/30/2013  . Annual physical exam 07/28/2013  . Anxiety and depression 04/15/2012  . Osteoporosis 12/07/2011  . Diabetes mellitus ([place]) 07/26/2011  . HTN (hypertension) 07/26/2011  . Hyperlipidemia 07/26/2011    Past Surgical History:  Procedure Laterality Date  . CHOLECYSTECTOMY  2003  . ESOPHAGOGASTRODUODENOSCOPY N/A 10/01/2013   Procedure: ESOPHAGOGASTRODUODENOSCOPY (EGD);  Surgeon: Milus Banister, MD;  Location: [facility];  Service: Endoscopy;  Laterality: N/A;  . WISDOM TOOTH EXTRACTION       OB History    Gravida  1   Para  1   Term  1   Preterm      AB      Living  1     SAB      TAB      Ectopic      Multiple      Live Births  1           Family History  Problem Relation Age of Onset  . Cancer Mother        ovary/uterus cancer  . Hyperlipidemia Mother        Jerilynn Mages and F  .  Heart disease Mother        M had CHF  . Hypertension Mother        Jerilynn Mages and F  . Diabetes Father   . Mental illness Maternal Grandmother   . Hypertension Maternal Grandmother   . Breast cancer Maternal Grandmother   . Mental illness Maternal Grandfather   . Hypertension Maternal Grandfather   . Stomach cancer Maternal Grandfather   . Mental illness Paternal Grandmother   . Hypertension Paternal Grandmother   . Breast cancer Paternal Grandmother   . Mental illness Paternal Grandfather   . Hypertension Paternal Grandfather   . Colon cancer Neg Hx     Social History   Tobacco Use  . Smoking status: Former Smoker    Types: Cigarettes    Quit date: 05/23/1979    Years since quitting: 40.1  . Smokeless tobacco: Never Used  Substance Use Topics  . Alcohol use: No    Comment: ocass  . Drug use: No    Home Medications Prior to Admission medications   Medication Sig Start Date End Date Taking? Authorizing Provider    acetaminophen (TYLENOL) 500 MG tablet Take 500 mg by mouth every 6 (six) hours as needed for headache.    [provider]  ALPRAZolam Duanne Moron) 0.25 MG tablet Take 2 tablets (0.5 mg total) by mouth at bedtime as needed for anxiety. 11/18/15   Colon Branch, MD  aspirin 81 MG tablet Take 81 mg by mouth daily.    [provider]  atorvastatin (LIPITOR) 10 MG tablet Take 1 tablet (10 mg total) by mouth daily. 07/06/15   Colon Branch, MD  cyclobenzaprine (FLEXERIL) 10 MG tablet Take 1 tablet (10 mg total) by mouth at bedtime as needed for muscle spasms. 08/17/15   Colon Branch, MD  diltiazem (CARTIA XT) 240 MG 24 hr capsule Take 1 capsule (240 mg total) by mouth daily. 10/19/15   Colon Branch, MD  fenofibrate micronized (LOFIBRA) 200 MG capsule Take 1 capsule (200 mg total) by mouth daily before breakfast. 07/01/15   Colon Branch, MD  glimepiride (AMARYL) 4 MG tablet Take 1.5 tablets (6 mg total) by mouth daily with breakfast. 11/18/15   Colon Branch, MD  metFORMIN (GLUCOPHAGE) 1000 MG tablet Take 1 tablet (1,000 mg total) by mouth 2 (two) times daily with a meal. 11/18/15   Colon Branch, MD  PARoxetine (PAXIL) 40 MG tablet Take 1 tablet (40 mg total) by mouth daily. 10/19/15   Colon Branch, MD  sitaGLIPtin (JANUVIA) 100 MG tablet Take 1 tablet (100 mg total) by mouth daily. 08/19/15   Colon Branch, MD  zolmitriptan (ZOMIG) 5 MG tablet Take 1 tablet (5 mg total) by mouth as needed for migraine. Patient not taking: Reported on 08/17/2015 04/07/15   Colon Branch, MD    Allergies    Patient has no known allergies.  Review of Systems   Review of Systems  Constitutional: Positive for fatigue. Negative for activity change, appetite change and chills.  HENT: Negative for congestion and rhinorrhea.   Eyes: Negative for visual disturbance.  Respiratory: Negative for cough, chest tightness and shortness of breath.   Gastrointestinal: Negative for abdominal pain, nausea and vomiting.  Genitourinary: Negative  for dysuria and hematuria.  Musculoskeletal: Positive for arthralgias and myalgias.  Skin: Negative for rash.  Neurological: Positive for weakness. Negative for dizziness and headaches.   all other systems are negative except as noted in the HPI  and PMH.    Physical Exam Updated Vital Signs BP (!) 154/83 (BP Location: Left Arm)   Pulse (!) 106   Temp 98.8 F (37.1 C) (Oral)   Resp 16   Ht 5\' 6"  (1.676 m)   Wt 74.8 kg   SpO2 99%   BMI 26.63 kg/m   Physical Exam Vitals and nursing note reviewed.  Constitutional:      General: She is not in acute distress.    Appearance: Normal appearance. She is well-developed and normal weight.  HENT:     Head: Normocephalic and atraumatic.     Mouth/Throat:     Pharynx: No oropharyngeal exudate.  Eyes:     Conjunctiva/sclera: Conjunctivae normal.     Pupils: Pupils are equal, round, and reactive to light.  Neck:     Comments: No meningismus. Cardiovascular:     Rate and Rhythm: Normal rate and regular rhythm.     Heart sounds: Normal heart sounds. No murmur.  Pulmonary:     Effort: Pulmonary effort is normal. No respiratory distress.     Breath sounds: Normal breath sounds.  Abdominal:     Palpations: Abdomen is soft.     Tenderness: There is no abdominal tenderness. There is no guarding or rebound.     Comments: Ecchymosis to left lower quadrant and left flank.  Patient denies trauma states this is from a "homeopathic medicine".  Genitourinary:    Comments: Chaperone present, no gross blood Musculoskeletal:        General: No tenderness. Normal range of motion.     Cervical back: Normal range of motion and neck supple.     Comments: Lower legs appear normal, inspection.  5/5 strength throughout  5/5 strength in bilateral lower extremities. Ankle plantar and dorsiflexion intact. Great toe extension intact bilaterally. +2 DP and PT pulses. +2 patellar reflexes   Compartments are soft.  There is no pain with palpation of the area.    Skin:    General: Skin is warm.  Neurological:     Mental Status: She is alert and oriented to person, place, and time.     Cranial Nerves: No cranial nerve deficit.     Motor: No abnormal muscle tone.     Coordination: Coordination normal.     Comments:  5/5 strength throughout. CN 2-12 intact.Equal grip strength.    Psychiatric:        Behavior: Behavior normal.     ED Results / Procedures / Treatments   Labs (all labs ordered are listed, but only abnormal results are displayed) Labs Reviewed  CBC WITH DIFFERENTIAL/PLATELET - Abnormal; Notable for the following components:      Result Value   Hemoglobin 7.5 (*)    HCT 29.5 (*)    MCV 72.5 (*)    MCH 18.4 (*)    MCHC 25.4 (*)    RDW 19.2 (*)    Platelets 420 (*)    All other components within normal limits  COMPREHENSIVE METABOLIC PANEL - Abnormal; Notable for the following components:   Sodium 147 (*)    Chloride 118 (*)    CO2 21 (*)    Glucose, Bld 136 (*)    All other components within normal limits  CK - Abnormal; Notable for the following components:   Total CK 11 (*)    All other components within normal limits  CBG MONITORING, ED - Abnormal; Notable for the following components:   Glucose-Capillary 130 (*)    All other  components within normal limits  RESPIRATORY PANEL BY RT PCR (FLU A&B, COVID)  URINALYSIS, ROUTINE W REFLEX MICROSCOPIC  VITAMIN B12  FOLATE  IRON AND TIBC  FERRITIN  RETICULOCYTES  POC OCCULT BLOOD, ED  TYPE AND SCREEN    EKG None  Radiology DG Tibia/Fibula Right  Result Date: 07/01/2019 CLINICAL DATA:  69 year old female with abrasion on the proximal tibia. EXAM: RIGHT TIBIA AND FIBULA - 2 VIEW COMPARISON:  None. FINDINGS: There is no acute fracture or dislocation. The bones are osteopenic. No significant arthritic changes. The soft tissues are unremarkable. IMPRESSION: Negative. Electronically Signed   By: Anner Crete M.D.   On: 07/01/2019 17:51   CT Head Wo  Contrast  Result Date: 07/01/2019 CLINICAL DATA:  Encephalopathy, lethargy, altered level of consciousness, somnolence EXAM: CT HEAD WITHOUT CONTRAST TECHNIQUE: Contiguous axial images were obtained from the base of the skull through the vertex without intravenous contrast. COMPARISON:  None. FINDINGS: Brain: Hypodensities in the bilateral frontal periventricular white matter and bilateral basal ganglia consistent with age-indeterminate small vessel ischemic change. No other signs of acute infarct or hemorrhage. Lateral ventricles and remaining midline structures are unremarkable. No acute extra-axial fluid collections. No mass effect. Vascular: No hyperdense vessel or unexpected calcification. Skull: Normal. Negative for fracture or focal lesion. Sinuses/Orbits: No acute finding. Other: None IMPRESSION: 1. Age-indeterminate small vessel ischemic changes within the bilateral frontal periventricular white matter and basal ganglia, favor chronic. 2. Otherwise unremarkable exam. Electronically Signed   By: Randa Ngo M.D.   On: 07/01/2019 18:49   MR BRAIN WO CONTRAST  Result Date: 07/01/2019 CLINICAL DATA:  Encephalopathy EXAM: MRI HEAD WITHOUT CONTRAST TECHNIQUE: Multiplanar, multiecho pulse sequences of the brain and surrounding structures were obtained without intravenous contrast. COMPARISON:  None. FINDINGS: Brain: No acute infarct, acute hemorrhage or extra-axial collection. Multifocal white matter hyperintensity, most commonly due to chronic ischemic microangiopathy. Mild generalized atrophy no chronic microhemorrhage. Normal midline structures. Vascular: Normal flow voids. Skull and upper cervical spine: Mild-to-moderate narrowing of the upper cervical spinal canal. Sinuses/Orbits: Negative. Other: None. IMPRESSION: 1. No acute intracranial abnormality. 2. Findings of chronic ischemic microangiopathy. 3. Mild-to-moderate narrowing of the upper cervical spinal canal. Electronically Signed   By: Ulyses Jarred M.D.   On: 07/01/2019 22:10   CT ABDOMEN PELVIS W CONTRAST  Result Date: 07/01/2019 CLINICAL DATA:  Flank pain. EXAM: CT ABDOMEN AND PELVIS WITH CONTRAST TECHNIQUE: Multidetector CT imaging of the abdomen and pelvis was performed using the standard protocol following bolus administration of intravenous contrast. CONTRAST:  186mL OMNIPAQUE IOHEXOL 300 MG/ML  SOLN COMPARISON:  CT scan 11/23/2014 FINDINGS: Lower chest: The lung bases are clear of an acute process. Basilar scarring changes and a somewhat mosaic pattern attenuation could be due to respiratory bronchiolitis, reactive airways disease or bronchitis. No worrisome pulmonary lesions or pleural effusion. The heart is normal in size. No pericardial effusion. Hepatobiliary: No focal hepatic lesions or intrahepatic biliary dilatation. The gallbladder surgically absent. No common bile duct dilatation. Pancreas: No mass, inflammation or ductal dilatation. Spleen: Low-attenuation lesion in the posterior aspect of the spleen is likely a benign cyst or hemangioma. There is a soft tissue mass between the spleen in the fundal region the stomach. This measures approximately 5.7 x 4.2 x 4.5 cm. Heterogeneous enhancement different than that of the spleen but it has been present since 2013 but is gradually enlarged. 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 MRI abdomen without and with contrast is  recommended for further evaluation. Adrenals/Urinary Tract: The adrenal glands are unremarkable and stable. Numerous bilateral renal calculi with large staghorn type calculus noted in the lower pole region of the right kidney measuring up to 27 mm. Lower pole calculus on the left measures a maximum of 19.5 mm. No worrisome renal lesions or hydroureteronephrosis. No obstructing ureteral calculi and no bladder calculi. No bladder mass is identified. Mild bilateral renal scarring changes. Stomach/Bowel: Small hiatal hernia but the stomach  is otherwise unremarkable. The duodenum, small bowel and colon are unremarkable. No acute inflammatory changes, mass lesions or obstructive findings. The terminal ileum is normal. The appendix is normal. Vascular/Lymphatic: Moderate atherosclerotic calcifications involving the aorta but no aneurysm. The branch vessels are patent. The major venous structures are patent. No mesenteric or retroperitoneal mass or adenopathy. Reproductive: The uterus and ovaries are unremarkable. Other: No pelvic mass or adenopathy. No free pelvic fluid collections. No inguinal mass or adenopathy. No abdominal wall hernia or subcutaneous lesions. Musculoskeletal: No acute bony findings or worrisome bone lesions. Degenerative anterolisthesis of L5 is noted. IMPRESSION: 1. Numerous bilateral renal calculi but no obstructing ureteral calculi or bladder calculi. 2. No acute abdominal/pelvic findings or lymphadenopathy. 3. 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 It has gradually enlarged since 2013 and is unlikely malignant. 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 MRI abdomen without and with contrast is recommended for further evaluation. 4. Status post cholecystectomy. No biliary dilatation. Aortic Atherosclerosis (ICD10-I70.0). Electronically Signed   By: Marijo Sanes M.D.   On: 07/01/2019 20:58   DG Chest Portable 1 View  Result Date: 07/01/2019 CLINICAL DATA:  Altered mental status. EXAM: PORTABLE CHEST 1 VIEW COMPARISON:  Radiograph 09/30/2013 FINDINGS: The cardiomediastinal contours are normal. Upper normal heart size likely accentuated by portable technique. The lungs are clear. Pulmonary vasculature is normal. No consolidation, pleural effusion, or pneumothorax. No acute osseous abnormalities are seen. IMPRESSION: No acute chest findings. Electronically Signed   By: Keith Rake M.D.   On: 07/01/2019 17:50    Procedures Procedures (including critical care  time)  Medications Ordered in ED Medications  sodium chloride 0.9 % bolus 1,000 mL (has no administration in time range)    ED Course  I have reviewed the triage vital signs and the nursing notes.  Pertinent labs & imaging results that were available during my care of the patient were reviewed by me and considered in my medical decision making (see chart for details).    MDM Rules/Calculators/A&P                     Patient here with right-sided leg pain has been going on for several weeks.  Daughter concerned about slowed mentation this morning  Pulses and strength intact. No evidence of acute limb ischemia.  Hemoglobin has dropped to 7.5 from 11 in 2017.  Patient unsure if she had recently dark or bloody stools.  DVT is positive for gastrocnemius vein on the right Intermuscular thrombus noted in the right gastrocnemius veins of the right calf  Hemoccult is negative.  CT head is negative.  Ecchymosis on patient's abdomen discussed with her daughter Estill Bamberg.  She states this is from muscular massage that patient is receiving on a daily basis. Patient denies any nonaccidental trauma so she feels safe at home  CT scan of abdomen does not show any acute pathology. 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  Admission discussed with Dr. Marthenia Rolling.  We will hold anticoagulation at this point pending MRI brain to make sure there is no acute stroke.  Patient denies any chest pain or shortness of breath  ED ECG REPORT   Date: 07/01/2019  Rate: 100  Rhythm: normal sinus rhythm  QRS Axis: normal  Intervals: normal  ST/T Wave abnormalities: nonspecific ST/T changes  Conduction Disutrbances:none  Narrative Interpretation:   Old EKG Reviewed: unchanged  I have personally reviewed the EKG tracing and agree with the computerized printout as noted.  Final Clinical Impression(s) / ED Diagnoses Final diagnoses:  Altered mental status, unspecified altered  mental status type  Acute deep vein thrombosis (DVT) of distal vein of right lower extremity [facility])    Rx / DC Orders ED Discharge Orders    None       Laverda Stribling, Annie Main, MD 07/02/19 [phone]

## 2019-07-01 NOTE — ED Notes (Signed)
Patient with Purewick, told we need a urine sample

## 2019-07-01 NOTE — ED Triage Notes (Signed)
Arrives via motorized scooter, daughter brought her in. C/C AMS, lethargy since midnight last night. Daughter reports the patient is not responding to her questions and more drowsy than normal.

## 2019-07-01 NOTE — Progress Notes (Addendum)
Right lower extremity venous duplex complete.  Intermuscular thrombus noted in the right gastrocnemius veins of the right calf, other visualized veins appear negative for DVT.  Critical findings reported to Dr. Wyvonnia Dusky @ 18:10.  Technical delay with images and preliminary results in [place].  [place], RVT 6:15 PM  07/01/2019

## 2019-07-02 ENCOUNTER — Encounter (HOSPITAL_COMMUNITY): Payer: Self-pay | Admitting: Internal Medicine

## 2019-07-02 DIAGNOSIS — I824Y1 Acute embolism and thrombosis of unspecified deep veins of right proximal lower extremity: Secondary | ICD-10-CM

## 2019-07-02 DIAGNOSIS — R4182 Altered mental status, unspecified: Secondary | ICD-10-CM | POA: Diagnosis present

## 2019-07-02 DIAGNOSIS — F419 Anxiety disorder, unspecified: Secondary | ICD-10-CM | POA: Diagnosis present

## 2019-07-02 DIAGNOSIS — E119 Type 2 diabetes mellitus without complications: Secondary | ICD-10-CM | POA: Diagnosis present

## 2019-07-02 DIAGNOSIS — K2100 Gastro-esophageal reflux disease with esophagitis, without bleeding: Secondary | ICD-10-CM | POA: Diagnosis present

## 2019-07-02 DIAGNOSIS — K259 Gastric ulcer, unspecified as acute or chronic, without hemorrhage or perforation: Secondary | ICD-10-CM | POA: Diagnosis present

## 2019-07-02 DIAGNOSIS — D508 Other iron deficiency anemias: Secondary | ICD-10-CM | POA: Diagnosis not present

## 2019-07-02 DIAGNOSIS — E870 Hyperosmolality and hypernatremia: Secondary | ICD-10-CM | POA: Diagnosis present

## 2019-07-02 DIAGNOSIS — I700 Atherosclerosis of aorta: Secondary | ICD-10-CM | POA: Diagnosis present

## 2019-07-02 DIAGNOSIS — M810 Age-related osteoporosis without current pathological fracture: Secondary | ICD-10-CM | POA: Diagnosis present

## 2019-07-02 DIAGNOSIS — I824Z1 Acute embolism and thrombosis of unspecified deep veins of right distal lower extremity: Secondary | ICD-10-CM | POA: Diagnosis not present

## 2019-07-02 DIAGNOSIS — K222 Esophageal obstruction: Secondary | ICD-10-CM | POA: Diagnosis present

## 2019-07-02 DIAGNOSIS — E785 Hyperlipidemia, unspecified: Secondary | ICD-10-CM | POA: Diagnosis present

## 2019-07-02 DIAGNOSIS — N200 Calculus of kidney: Secondary | ICD-10-CM | POA: Diagnosis present

## 2019-07-02 DIAGNOSIS — G9341 Metabolic encephalopathy: Secondary | ICD-10-CM | POA: Diagnosis present

## 2019-07-02 DIAGNOSIS — G9340 Encephalopathy, unspecified: Secondary | ICD-10-CM | POA: Diagnosis not present

## 2019-07-02 DIAGNOSIS — I82461 Acute embolism and thrombosis of right calf muscular vein: Secondary | ICD-10-CM | POA: Diagnosis present

## 2019-07-02 DIAGNOSIS — M1990 Unspecified osteoarthritis, unspecified site: Secondary | ICD-10-CM | POA: Diagnosis present

## 2019-07-02 DIAGNOSIS — D735 Infarction of spleen: Secondary | ICD-10-CM | POA: Diagnosis present

## 2019-07-02 DIAGNOSIS — Z8719 Personal history of other diseases of the digestive system: Secondary | ICD-10-CM

## 2019-07-02 DIAGNOSIS — K269 Duodenal ulcer, unspecified as acute or chronic, without hemorrhage or perforation: Secondary | ICD-10-CM | POA: Diagnosis present

## 2019-07-02 DIAGNOSIS — I471 Supraventricular tachycardia: Secondary | ICD-10-CM | POA: Diagnosis not present

## 2019-07-02 DIAGNOSIS — D473 Essential (hemorrhagic) thrombocythemia: Secondary | ICD-10-CM | POA: Diagnosis present

## 2019-07-02 DIAGNOSIS — Z20822 Contact with and (suspected) exposure to covid-19: Secondary | ICD-10-CM | POA: Diagnosis present

## 2019-07-02 DIAGNOSIS — E860 Dehydration: Secondary | ICD-10-CM | POA: Diagnosis present

## 2019-07-02 DIAGNOSIS — I160 Hypertensive urgency: Secondary | ICD-10-CM | POA: Diagnosis present

## 2019-07-02 DIAGNOSIS — I10 Essential (primary) hypertension: Secondary | ICD-10-CM | POA: Diagnosis present

## 2019-07-02 DIAGNOSIS — K449 Diaphragmatic hernia without obstruction or gangrene: Secondary | ICD-10-CM | POA: Diagnosis present

## 2019-07-02 DIAGNOSIS — D509 Iron deficiency anemia, unspecified: Secondary | ICD-10-CM | POA: Diagnosis present

## 2019-07-02 LAB — BASIC METABOLIC PANEL
Anion gap: 7 (ref 5–15)
BUN: 11 mg/dL (ref 8–23)
CO2: 19 mmol/L — ABNORMAL LOW (ref 22–32)
Calcium: 8.9 mg/dL (ref 8.9–10.3)
Chloride: 119 mmol/L — ABNORMAL HIGH (ref 98–111)
Creatinine, Ser: 0.73 mg/dL (ref 0.44–1.00)
GFR calc Af Amer: >60 mL/min (ref >60)
GFR calc non Af Amer: >60 mL/min (ref >60)
Glucose, Bld: 162 mg/dL — ABNORMAL HIGH (ref 70–99)
Potassium: 3.8 mmol/L (ref 3.5–5.1)
Sodium: 145 mmol/L (ref 135–145)

## 2019-07-02 LAB — CBC
HCT: 25.9 % — ABNORMAL LOW (ref 36.0–46.0)
HCT: 26.3 % — ABNORMAL LOW (ref 36.0–46.0)
Hemoglobin: 6.8 g/dL — CL (ref 12.0–15.0)
Hemoglobin: 6.8 g/dL — CL (ref 12.0–15.0)
MCH: 18.8 pg — ABNORMAL LOW (ref 26.0–34.0)
MCH: 19 pg — ABNORMAL LOW (ref 26.0–34.0)
MCHC: 25.9 g/dL — ABNORMAL LOW (ref 30.0–36.0)
MCHC: 26.3 g/dL — ABNORMAL LOW (ref 30.0–36.0)
MCV: 72.5 fL — ABNORMAL LOW (ref 80.0–100.0)
MCV: 72.7 fL — ABNORMAL LOW (ref 80.0–100.0)
Platelets: 392 10*3/uL (ref 150–400)
Platelets: 422 10*3/uL — ABNORMAL HIGH (ref 150–400)
RBC: 3.57 MIL/uL — ABNORMAL LOW (ref 3.87–5.11)
RBC: 3.62 MIL/uL — ABNORMAL LOW (ref 3.87–5.11)
RDW: 19 % — ABNORMAL HIGH (ref 11.5–15.5)
RDW: 19.1 % — ABNORMAL HIGH (ref 11.5–15.5)
WBC: 7.6 10*3/uL (ref 4.0–10.5)
WBC: 8.5 10*3/uL (ref 4.0–10.5)
nRBC: 0 % (ref 0.0–0.2)
nRBC: 0 % (ref 0.0–0.2)

## 2019-07-02 LAB — RAPID URINE DRUG SCREEN, HOSP PERFORMED
Amphetamines: NOT DETECTED
Barbiturates: NOT DETECTED
Benzodiazepines: NOT DETECTED
Cocaine: NOT DETECTED
Opiates: NOT DETECTED
Tetrahydrocannabinol: NOT DETECTED

## 2019-07-02 LAB — FERRITIN: Ferritin: 10 ng/mL — ABNORMAL LOW (ref 11–307)

## 2019-07-02 LAB — TYPE AND SCREEN
ABO/RH(D): A NEG
Antibody Screen: NEGATIVE

## 2019-07-02 LAB — SEDIMENTATION RATE: Sed Rate: 5 mm/hr (ref 0–22)

## 2019-07-02 LAB — VITAMIN B12: Vitamin B-12: 1152 pg/mL — ABNORMAL HIGH (ref 180–914)

## 2019-07-02 LAB — C-REACTIVE PROTEIN: CRP: <0.5 mg/dL (ref <1.0)

## 2019-07-02 LAB — GLUCOSE, CAPILLARY
Glucose-Capillary: 140 mg/dL — ABNORMAL HIGH (ref 70–99)
Glucose-Capillary: 129 mg/dL — ABNORMAL HIGH (ref 70–99)
Glucose-Capillary: 146 mg/dL — ABNORMAL HIGH (ref 70–99)
Glucose-Capillary: 135 mg/dL — ABNORMAL HIGH (ref 70–99)

## 2019-07-02 LAB — HEMOGLOBIN AND HEMATOCRIT, BLOOD
HCT: 27.3 % — ABNORMAL LOW (ref 36.0–46.0)
HCT: 28.6 % — ABNORMAL LOW (ref 36.0–46.0)
Hemoglobin: 6.9 g/dL — CL (ref 12.0–15.0)
Hemoglobin: 7.9 g/dL — ABNORMAL LOW (ref 12.0–15.0)

## 2019-07-02 LAB — IRON AND TIBC
Iron: 24 ug/dL — ABNORMAL LOW (ref 28–170)
Saturation Ratios: 4 % — ABNORMAL LOW (ref 10.4–31.8)
TIBC: 560 ug/dL — ABNORMAL HIGH (ref 250–450)
UIBC: 536 ug/dL

## 2019-07-02 LAB — PHOSPHORUS: Phosphorus: 2.6 mg/dL (ref 2.5–4.6)

## 2019-07-02 LAB — TSH: TSH: 3.42 u[IU]/mL (ref 0.350–4.500)

## 2019-07-02 LAB — HEPARIN LEVEL (UNFRACTIONATED): Heparin Unfractionated: 0.35 IU/mL (ref 0.30–0.70)

## 2019-07-02 LAB — RETICULOCYTES
Immature Retic Fract: 26.9 % — ABNORMAL HIGH (ref 2.3–15.9)
RBC.: 3.75 MIL/uL — ABNORMAL LOW (ref 3.87–5.11)
Retic Count, Absolute: 54.8 10*3/uL (ref 19.0–186.0)
Retic Ct Pct: 1.5 % (ref 0.4–3.1)

## 2019-07-02 LAB — PROTIME-INR
INR: 1.1 (ref 0.8–1.2)
Prothrombin Time: 14.2 seconds (ref 11.4–15.2)

## 2019-07-02 LAB — CREATININE, SERUM
Creatinine, Ser: 0.59 mg/dL (ref 0.44–1.00)
GFR calc Af Amer: >60 mL/min (ref >60)
GFR calc non Af Amer: >60 mL/min (ref >60)

## 2019-07-02 LAB — FOLATE: Folate: 9.9 ng/mL (ref >5.9)

## 2019-07-02 LAB — MAGNESIUM: Magnesium: 1.4 mg/dL — ABNORMAL LOW (ref 1.7–2.4)

## 2019-07-02 LAB — HIV ANTIBODY (ROUTINE TESTING W REFLEX): HIV Screen 4th Generation wRfx: NONREACTIVE

## 2019-07-02 LAB — PREPARE RBC (CROSSMATCH)

## 2019-07-02 MED ORDER — SODIUM CHLORIDE 0.9 % IV SOLN
510.0000 mg | Freq: Once | INTRAVENOUS | Status: AC
  Administered 2019-07-02: 510 mg via INTRAVENOUS
  Filled 2019-07-02: qty 17

## 2019-07-02 MED ORDER — MAGNESIUM SULFATE 2 GM/50ML IV SOLN
2.0000 g | Freq: Once | INTRAVENOUS | Status: AC
  Administered 2019-07-02: 2 g via INTRAVENOUS
  Filled 2019-07-02: qty 50

## 2019-07-02 MED ORDER — HEPARIN (PORCINE) 25000 UT/250ML-% IV SOLN
1100.0000 [IU]/h | INTRAVENOUS | Status: AC
  Administered 2019-07-02: 1100 [IU]/h via INTRAVENOUS
  Filled 2019-07-02 (×2): qty 250

## 2019-07-02 MED ORDER — SODIUM CHLORIDE 0.9% IV SOLUTION: Freq: Once | INTRAVENOUS | Status: DC

## 2019-07-02 MED ORDER — ACETAMINOPHEN 325 MG PO TABS
650.0000 mg | ORAL_TABLET | Freq: Four times a day (QID) | ORAL | Status: AC | PRN
  Administered 2019-07-02: 650 mg via ORAL
  Filled 2019-07-02: qty 2

## 2019-07-02 MED ORDER — HEPARIN BOLUS VIA INFUSION: 2000.0000 [IU] | Freq: Once | INTRAVENOUS | Status: DC

## 2019-07-02 NOTE — Evaluation (Signed)
Occupational Therapy Evaluation Patient Details Name: Julie [name] MRN: [id number] DOB: June 24, 1950 Today's Date: 07/02/2019    History of Present Illness Julie [name] is a 69 y.o. female admitted for RLE pain and acute encephalopathy. PMH significant for diabetes mellitus type 2, nephrolithiasis, hypertension, gastric ulcer due to NSAID's in 2015, DJD, anxiety and depression. She presented to the ED with c/o Rt LE pain that prohibited her from sleep at night prior to admission. Pt's daughter also noted that her mother seemed confused/had some altered mental status. pt confirmed to have Rt LE DVT and was anemic.   Clinical Impression   Pt was admitted for the above. Pt was independent with adls up until a week ago; started using RW/scooter and needed assistance recently.  Pt had not received blood transfusion when OT arrived (Hgb 6.9). Linen and her clothing were saturated with urine; assisted with cleaning up and changing and up to chair.  Pt needs min A overall for mobility and ADLs. Will follow in acute setting with supervision level goals    Follow Up Recommendations  Supervision/Assistance - 24 hour;Home health OT    Equipment Recommendations  None recommended by OT(has [facility])    Recommendations for Other Services       Precautions / Restrictions Precautions Precautions: Fall Precaution Comments: back pain Restrictions Weight Bearing Restrictions: No      Mobility Bed Mobility Overal bed mobility: Needs Assistance Bed Mobility: Supine to Sit;Sit to Supine     Supine to sit: Supervision;HOB elevated Sit to supine: Supervision;HOB elevated   General bed mobility comments: cues to scoot in bed, no assist required to move, pt required extra time and effort  Transfers Overall transfer level: Needs assistance Equipment used: Rolling walker (2 wheeled) Transfers: Sit to/from Stand Sit to Stand: Min guard Stand pivot transfers: Min assist       General transfer comment:  cues for safe hand placement/technique, no assist requried to initiate power up or rise, guarding for safety. pt decliend to sit up in chair.    Balance Overall balance assessment: Needs assistance Sitting-balance support: Feet supported Sitting balance-Leahy Scale: Good     Standing balance support: During functional activity;Bilateral upper extremity supported Standing balance-Leahy Scale: Fair                             ADL either performed or assessed with clinical judgement   ADL Overall ADL's : Needs assistance/impaired Eating/Feeding: Independent   Grooming: Set up   Upper Body Bathing: Set up   Lower Body Bathing: Minimal assistance;Sit to/from stand   Upper Body Dressing : Minimal assistance(IV)   Lower Body Dressing: Minimal assistance   Toilet Transfer: Minimal assistance;Stand-pivot;RW(chair)   Toileting- Clothing Manipulation and Hygiene: Minimal assistance         General ADL Comments: pt was calling for assistance; bed saturated. Assisted with cleaning up and changing gown/socks. Pt is able to cross legs for socks.  Repositioned up in chair while NT came to change sheets.       Vision         Perception     Praxis      Pertinent Vitals/Pain Pain Assessment: 0-10 Pain Score: 8  Faces Pain Scale: Hurts even more Pain Location: Rt leg/ankle Pain Descriptors / Indicators: Aching Pain Intervention(s): Monitored during session     Hand Dominance Right   Extremity/Trunk Assessment Upper Extremity Assessment Upper Extremity Assessment: Defer to OT evaluation  Lower Extremity Assessment Lower Extremity Assessment: Generalized weakness   Cervical / Trunk Assessment Cervical / Trunk Assessment: Normal   Communication Communication Communication: No difficulties   Cognition Arousal/Alertness: Awake/alert Behavior During Therapy: WFL for tasks assessed/performed Overall Cognitive Status: Impaired/Different from baseline Area of  Impairment: Orientation;Memory;Following commands                 Orientation Level: Disoriented to;Person;Place;Time;Situation   Memory: Decreased short-term memory Following Commands: Follows one step commands with increased time;Follows multi-step commands with increased time;Follows multi-step commands inconsistently;Follows one step commands consistently       General Comments: pt unable to state correct date, year, month, unable to state hospital name but able to state the city we are in. Pt unclear on reasons for being in the hospital and required re-orientation.   General Comments  decreased weight bearing on RLE when standing    Exercises     Shoulder Instructions      Home Living Family/patient expects to be discharged to:: Private residence Living Arrangements: Spouse/significant other;Children Available Help at Discharge: Family;Available 24 hours/day Type of Home: House Home Access: Stairs to enter [employer] of Steps: 8 Entrance Stairs-Rails: Right;Left Home Layout: Two level(split level) Alternate Level Stairs-Number of Steps: front door brings pt into main level and she does not have to go up/down any other stairs   Bathroom Shower/Tub: Walk-in shower;Door   [employer] Toilet: Standard Bathroom Accessibility: Yes   Home Equipment: Environmental consultant - 4 wheels;Shower seat - built in;Bedside commode   Additional Comments: pt has recently started using rollator due to sciatica pain      Prior Functioning/Environment Level of Independence: Independent with assistive device(s)        Comments: pt was independent until recently.  Started using RW/scooter recently        OT Problem List: Decreased activity tolerance;Impaired balance (sitting and/or standing);Pain;Decreased cognition      OT Treatment/Interventions: Self-care/ADL training;Energy conservation;DME and/or AE instruction;Balance training;Patient/family education;Therapeutic activities     OT Goals(Current goals can be found in the care plan section) Acute Rehab OT Goals Patient Stated Goal: to get back independence OT Goal Formulation: With patient Time For Goal Achievement: 07/16/19 Potential to Achieve Goals: Good ADL Goals Pt Will Transfer to Toilet: with supervision;ambulating;bedside commode Pt Will Perform Tub/Shower Transfer: with min guard assist;ambulating;shower seat;Shower transfer Additional ADL Goal #1: pt will complete LB ADL with set up/supervison  OT Frequency: Min 2X/week   Barriers to D/C:            Co-evaluation              AM-PAC OT "6 Clicks" Daily Activity     Outcome Measure Help from another person eating meals?: None Help from another person taking care of personal grooming?: A Little Help from another person toileting, which includes using toliet, bedpan, or urinal?: A Little Help from another person bathing (including washing, rinsing, drying)?: A Little Help from another person to put on and taking off regular upper body clothing?: A Little Help from another person to put on and taking off regular lower body clothing?: A Little 6 Click Score: 19   End of Session    Activity Tolerance: Patient tolerated treatment well Patient left: in chair;with call bell/phone within reach;with chair alarm set  OT Visit Diagnosis: Unsteadiness on feet (R26.81);Pain Pain - Right/Left: Right Pain - part of body: Leg                Time: [id number] OT  Time Calculation (min): 32 min Charges:  OT General Charges $OT Visit: 1 Visit OT Evaluation $OT Eval Low Complexity: 1 Low OT Treatments $Self Care/Home Management : 8-22 mins  Kasean Denherder S, OTR/L Acute Rehabilitation Services 07/02/2019  Brigitt Mcclish 07/02/2019, 1:56 PM

## 2019-07-02 NOTE — Evaluation (Addendum)
+ Physical Therapy Evaluation Patient Details Name: Julie [name] MRN: [id number] DOB: May 29, 1950 Today's Date: 07/02/2019   History of Present Illness  Julie [name] is a 69 y.o. female admitted for RLE pain and acute encephalopathy. PMH significant for diabetes mellitus type 2, nephrolithiasis, hypertension, gastric ulcer due to NSAID's in 2015, DJD, anxiety and depression. She presented to the ED with c/o Rt LE pain that prohibited her from sleep at night prior to admission. Pt's daughter also noted that her mother seemed confused/had some altered mental status. pt confirmed to have Rt LE DVT and was anemic.    Clinical Impression  Julie [name] is 69 y.o. female admitted with above HPI and diagnosis. Patient is currently limited by functional impairments below (see PT problem list). Patient lives with her daughter and husband and is independent at baseline however has need use of rollator over last month due to sciatica. Patient limited by fatigue and weakness, orthostatic vitals assessed with transfer today (see below). Patient will benefit from continued skilled PT interventions to address impairments and progress independence with mobility, recommending HHPT. Acute PT will follow and progress as able.      07/02/19 1216  Therapy Vitals  Patient Position (if appropriate) Orthostatic Vitals  Orthostatic Lying   BP- Lying (!) 173/91  Pulse- Lying 87  Orthostatic Sitting  BP- Sitting (!) 181/93  Pulse- Sitting 97  Orthostatic Standing at 3 minutes  BP- Standing at 3 minutes (!) 166/95  Pulse- Standing at 3 minutes 109     Follow Up Recommendations Home health PT    Equipment Recommendations  None recommended by PT    Recommendations for Other Services       Precautions / Restrictions Precautions Precautions: Fall Precaution Comments: (P) back pain Restrictions Weight Bearing Restrictions: No      Mobility  Bed Mobility Overal bed mobility: Needs Assistance Bed  Mobility: Supine to Sit;Sit to Supine     Supine to sit: Supervision;HOB elevated Sit to supine: Supervision;HOB elevated   General bed mobility comments: cues to scoot in bed, no assist required to move, pt required extra time and effort  Transfers Overall transfer level: Needs assistance Equipment used: Rolling walker (2 wheeled) Transfers: Sit to/from Stand Sit to Stand: Min guard Stand pivot transfers: (P) Min assist       General transfer comment: cues for safe hand placement/technique, no assist requried to initiate power up or rise, guarding for safety. pt decliend to sit up in chair.  Ambulation/Gait                Stairs            Wheelchair Mobility    Modified Rankin (Stroke Patients Only)       Balance Overall balance assessment: Needs assistance Sitting-balance support: Feet supported Sitting balance-Leahy Scale: Good     Standing balance support: During functional activity;Bilateral upper extremity supported Standing balance-Leahy Scale: Fair                Pertinent Vitals/Pain Pain Assessment: 0-10 Pain Score: 8  Faces Pain Scale: (P) Hurts even more Pain Location: Rt leg/ankle Pain Descriptors / Indicators: Aching Pain Intervention(s): Monitored during session    Home Living Family/patient expects to be discharged to:: Private residence Living Arrangements: Spouse/significant other;Children Available Help at Discharge: Family;Available 24 hours/day Type of Home: House Home Access: Stairs to enter Entrance Stairs-Rails: Psychiatric nurse of Steps: 8 Home Layout: Two level(split level) Home Equipment: (P) Walker -  4 wheels;Shower seat - built in;Bedside commode Additional Comments: pt has recently started using rollator due to sciatica pain    Prior Function Level of Independence: Independent with assistive device(s)         Comments: (P) pt was independent until recently.  Started using RW/scooter  recently     Hand Dominance   Dominant Hand: Right    Extremity/Trunk Assessment   Upper Extremity Assessment Upper Extremity Assessment: Defer to OT evaluation    Lower Extremity Assessment Lower Extremity Assessment: Generalized weakness    Cervical / Trunk Assessment Cervical / Trunk Assessment: Normal  Communication   Communication: No difficulties  Cognition Arousal/Alertness: Awake/alert Behavior During Therapy: (P) WFL for tasks assessed/performed Overall Cognitive Status: Impaired/Different from baseline Area of Impairment: Orientation;Memory;Following commands                 Orientation Level: Disoriented to;Person;Place;Time;Situation   Memory: Decreased short-term memory Following Commands: Follows one step commands with increased time;Follows multi-step commands with increased time;Follows multi-step commands inconsistently;Follows one step commands consistently       General Comments: pt unable to state correct date, year, month, unable to state hospital name but able to state the city we are in. Pt unclear on reasons for being in the hospital and required re-orientation.      General Comments General comments (skin integrity, edema, etc.): (P) decreased weight bearing on RLE when standing    Exercises     Assessment/Plan    PT Assessment Patient needs continued PT services  PT Problem List Decreased strength;Decreased balance;Decreased mobility;Decreased activity tolerance;Decreased knowledge of use of DME;Decreased cognition       PT Treatment Interventions DME instruction;Functional mobility training;Balance training;Patient/family education;Therapeutic activities;Gait training;Stair training;Therapeutic exercise    PT Goals (Current goals can be found in the Care Plan section)  Acute Rehab PT Goals Patient Stated Goal: to get back independence PT Goal Formulation: With patient/family Time For Goal Achievement: 07/16/19 Potential to  Achieve Goals: Good    Frequency Min 3X/week    AM-PAC PT "6 Clicks" Mobility  Outcome Measure Help needed turning from your back to your side while in a flat bed without using bedrails?: A Little Help needed moving from lying on your back to sitting on the side of a flat bed without using bedrails?: A Little Help needed moving to and from a bed to a chair (including a wheelchair)?: A Little Help needed standing up from a chair using your arms (e.g., wheelchair or bedside chair)?: A Little Help needed to walk in hospital room?: A Lot Help needed climbing 3-5 steps with a railing? : A Lot 6 Click Score: 16    End of Session Equipment Utilized During Treatment: Gait belt Activity Tolerance: Patient tolerated treatment well Patient left: with call bell/phone within reach;in bed;with family/visitor present;with bed alarm set Nurse Communication: Mobility status PT Visit Diagnosis: Muscle weakness (generalized) (M62.81);Difficulty in walking, not elsewhere classified (R26.2)    Time: [id number] PT Time Calculation (min) (ACUTE ONLY): 30 min   Charges:   PT Evaluation $PT Eval Low Complexity: 1 Low PT Treatments $Therapeutic Activity: 8-22 mins       Verner Mould, DPT Physical Therapist with [facility] [phone]  07/02/2019 1:57 PM

## 2019-07-02 NOTE — Progress Notes (Signed)
ANTICOAGULATION CONSULT NOTE  Pharmacy Consult for IV heparin Indication: new DVT in setting of anemia, hx gastric ulcers  No Known Allergies  Patient Measurements: Height: 5\' 6"  (167.6 cm) Weight: 165 lb (74.8 kg) IBW/kg (Calculated) : 59.3 Heparin Dosing Weight: TBW (used [place] for dosing)  Vital Signs: Temp: 98 F (36.7 C) (02/10 2036) Temp Source: Oral (02/10 2036) BP: 162/82 (02/10 2036) Pulse Rate: 86 (02/10 2036)  Labs: Recent Labs    07/01/19 1641 07/01/19 1641 07/01/19 2350 07/01/19 2350 07/02/19 0147 07/02/19 0147 07/02/19 0503 07/02/19 0918 07/02/19 2007  HGB 7.5*   < > 6.8*   < > 6.8*   < > 6.9*  --  7.9*  HCT 29.5*   < > 25.9*   < > 26.3*  --  27.3*  --  28.6*  PLT 420*  --  392  --  422*  --   --   --   --   LABPROT  --   --   --   --   --   --   --  14.2  --   INR  --   --   --   --   --   --   --  1.1  --   HEPARINUNFRC  --   --   --   --   --   --   --   --  0.35  CREATININE 0.83  --  0.59  --  0.73  --   --   --   --   CKTOTAL 11*  --   --   --   --   --   --   --   --    < > = values in this interval not displayed.    Estimated Creatinine Clearance: 71.5 mL/min (by C-G formula based on SCr of 0.73 mg/dL).   Assessment: 39 yoF with PMH DM2, HTN, HLD, bleeding gastric ulcer (2015), DJD, anxiety/depression, nephrolithiasis, sciatica, admitted 2/9 for encephalopathy and RLE pain. Found to have R calf DVT, but hemoglobin acutely down from baseline. Hx bleeding gastric ulcers, but FOBT currently negative. MRI brain negative for CVA. Pharmacy to dose IV heparin per GI recommendations.   Baseline INR WNL; aPTT not done  Prior anticoagulation: none, ASA 81 mg only  Significant events:  Today, 07/02/2019:  CBC: Hgb low at 6.8; patient still deciding on whether or not to accept transfusion; Plt borderline high  No bleeding or infusion issues per nursing  SCr stable WNL  Discussed with MD, will hold ASA while on heparin given no hx  CAD/CVA  Will hold off on bolus given anemia and concern for occult bleeding  2nd shift follow up:  First heparin level is therapeutic at 0.35 on rate of 1100 units/hr  No bleeding reported  Goal of Therapy: Heparin level 0.3-0.7 units/ml Monitor platelets by anticoagulation protocol: Yes  Plan:  Continue Heparin at 1100 units/hr   Daily CBC, daily heparin level  Monitor for signs of bleeding or thrombosis  If DOAC required for long-term anticoagulation, recommend Eliquis rather than Xarelto given interaction with diltiazem  Eudelia Bunch, Pharm.D [phone] 07/02/2019 9:32 PM

## 2019-07-02 NOTE — Consult Note (Addendum)
Referring Provider: Dr. Karleen Hampshire, [facility] Primary Care Physician:  Levy Pupa, MD Primary Gastroenterologist:  Dr. Ardis Hughs  Reason for Consultation:  Anemia and history of GU  HPI: Julie [name] is a 69 y.o. female past medical history significant for diabetes mellitus type 2, nephrolithiasis, hypertension, gastric ulcer due to NSAID's in 2015, DJD, anxiety and depression.  She presented to the emergency department on February 9 with complaints of right lower extremity pain that prohibited her from sleep at night prior to admission.  On the way here her daughter also noted that her mother seemed confused/had some altered mental status.  This prompted CT and MRI of the head/brain, which were unremarkable for any acute issues.  Right lower extremity Doppler did confirm DVT.  In the ER she was also noted to be anemic with a hemoglobin of 7.5 g as compared to 11.3 g three years ago.  This morning hemoglobin is 6.8 g.  Suggested transfusion, but patient has not yet agreed to this.  Vitamin B12 and folate levels are normal.  Iron studies confirm iron deficiency, which appears to been an issue dating back several years.  Hemoccult negative in the ED.  she underwent CT scan of the abdomen and pelvis with contrast that showed the following:  IMPRESSION: 1. Numerous bilateral renal calculi but no obstructing ureteral calculi or bladder calculi. 2. No acute abdominal/pelvic findings or lymphadenopathy. 3. 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 It has gradually enlarged since 2013 and is unlikely malignant. 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 MRI abdomen without and with contrast is recommended for further evaluation. 4. Status post cholecystectomy. No biliary dilatation.  Aortic Atherosclerosis (ICD10-I70.0).  She tells me that she just began seeing a new PCP and that they did suggest that she start taking some iron supplements recently.  She  denies any weakness, fatigue, shortness of breath.  She denies seeing any dark or bloody stools.  Once again, she was Hemoccult negative in the emergency department.  She says that she moves her bowels well without any extreme constipation or diarrhea.  Reports no abdominal pain or dysphagia.  She says her appetite is not great and does report some weight loss, but cannot quantify.  She has never undergone colonoscopy in the past.  She did have an EGD in 09/2013 showed the following:  Prepyloric ulcer causing partial gastric outlet obstruction. NSAIDs most likely cause, biopsies negative for Hpylori.  Past Medical History:  Diagnosis Date  . Anxiety and depression   . DJD (degenerative joint disease)   . GERD (gastroesophageal reflux disease)   . History of gastric ulcer    w/  bleeding-  05/ 2015  . History of kidney stones   . Hypertension 2011  . Migraines   . Nephrolithiasis    bilateral  . Osteoporosis   . Right ureteral stone   . Type 2 diabetes mellitus ([place])     Past Surgical History:  Procedure Laterality Date  . CHOLECYSTECTOMY  2003  . ESOPHAGOGASTRODUODENOSCOPY N/A 10/01/2013   Procedure: ESOPHAGOGASTRODUODENOSCOPY (EGD);  Surgeon: Milus Banister, MD;  Location: [facility];  Service: Endoscopy;  Laterality: N/A;  . WISDOM TOOTH EXTRACTION      Prior to Admission medications   Medication Sig Start Date End Date Taking? Authorizing Provider  acetaminophen (TYLENOL) 500 MG tablet Take 500 mg by mouth every 6 (six) hours as needed for headache.   Yes [provider]  ALPRAZolam (XANAX) 0.25 MG tablet Take 2 tablets (0.5 mg total) by mouth at bedtime as needed for anxiety. 11/18/15  Yes Colon Branch, MD  aspirin 81 MG tablet Take 81 mg by mouth daily.   Yes [provider]  atorvastatin (LIPITOR) 10 MG tablet Take 1 tablet (10 mg total) by mouth daily. 07/06/15  Yes Paz, Alda Berthold, MD  buPROPion (WELLBUTRIN XL) 150 MG 24 hr tablet Take 75 mg by mouth daily.   04/23/19  Yes [provider]  diltiazem (CARTIA XT) 240 MG 24 hr capsule Take 1 capsule (240 mg total) by mouth daily. 10/19/15  Yes Paz, Alda Berthold, MD  fenofibrate micronized (LOFIBRA) 200 MG capsule Take 1 capsule (200 mg total) by mouth daily before breakfast. 07/01/15  Yes Paz, Alda Berthold, MD  glimepiride (AMARYL) 4 MG tablet Take 1.5 tablets (6 mg total) by mouth daily with breakfast. 11/18/15  Yes Colon Branch, MD  ibuprofen (ADVIL) 200 MG tablet Take 200 mg by mouth every 6 (six) hours as needed for headache or moderate pain.   Yes [provider]  losartan (COZAAR) 50 MG tablet Take 25 mg by mouth daily.  04/22/19  Yes [provider]  metFORMIN (GLUCOPHAGE) 1000 MG tablet Take 1 tablet (1,000 mg total) by mouth 2 (two) times daily with a meal. 11/18/15  Yes Paz, Alda Berthold, MD  PARoxetine (PAXIL) 40 MG tablet Take 1 tablet (40 mg total) by mouth daily. 10/19/15  Yes Paz, Alda Berthold, MD  cyclobenzaprine (FLEXERIL) 10 MG tablet Take 1 tablet (10 mg total) by mouth at bedtime as needed for muscle spasms. Patient not taking: Reported on 07/01/2019 08/17/15   Colon Branch, MD  sitaGLIPtin (JANUVIA) 100 MG tablet Take 1 tablet (100 mg total) by mouth daily. Patient not taking: Reported on 07/01/2019 08/19/15   Colon Branch, MD  zolmitriptan (ZOMIG) 5 MG tablet Take 1 tablet (5 mg total) by mouth as needed for migraine. Patient not taking: Reported on 08/17/2015 04/07/15   Colon Branch, MD    Current Facility-Administered Medications  Medication Dose Route Frequency Provider Last Rate Last Admin  . 0.45 % sodium chloride infusion   Intravenous Continuous Dana Allan I, MD 125 mL/hr at 07/02/19 0300 Rate Verify at 07/02/19 0300  . 0.9 %  sodium chloride infusion (Manually program via Guardrails IV Fluids)   Intravenous Once Lang Snow, NP      . aspirin EC tablet 81 mg  81 mg Oral Daily Dana Allan I, MD      . atorvastatin (LIPITOR) tablet 10 mg  10 mg Oral Daily Dana Allan I, MD      . diltiazem (CARDIZEM CD) 24 hr capsule 240 mg  240 mg Oral Daily Dana Allan I, MD      . fenofibrate tablet 54 mg  54 mg Oral Daily Dana Allan I, MD      . free water 200 mL  200 mL Per Tube Q4H Dana Allan I, MD   200 mL at 07/02/19 0328  . insulin aspart (novoLOG) injection 0-5 Units  0-5 Units Subcutaneous QHS Dana Allan I, MD      . insulin aspart (novoLOG) injection 0-6 Units  0-6 Units Subcutaneous TID WC Dana Allan I, MD      . losartan (COZAAR) tablet 25 mg  25 mg Oral Daily Dana Allan I, MD      . magnesium sulfate IVPB 2 g 50 mL  2 g Intravenous Once Hosie Poisson, MD      . oxyCODONE (OXYCONTIN) 12 hr tablet 10 mg  10 mg Oral Q12H Dana Allan I, MD   10 mg at 07/01/19 2353  . oxyCODONE-acetaminophen (PERCOCET/ROXICET) 5-325 MG per tablet 1 tablet  1 tablet Oral Q6H PRN Bonnell Public, MD   1 tablet at 07/02/19 0449  . PARoxetine (PAXIL) tablet 40 mg  40 mg Oral Daily Bonnell Public, MD        Allergies as of 07/01/2019  . (No Known Allergies)    Family History  Problem Relation Age of Onset  . Cancer Mother        ovary/uterus cancer  . Hyperlipidemia Mother        M and F  . Heart disease Mother        M had CHF  . Hypertension Mother        Jerilynn Mages and F  . Diabetes Father   . Mental illness Maternal Grandmother   . Hypertension Maternal Grandmother   . Breast cancer Maternal Grandmother   . Mental illness Maternal Grandfather   . Hypertension Maternal Grandfather   . Stomach cancer Maternal Grandfather   . Mental illness Paternal Grandmother   . Hypertension Paternal Grandmother   . Breast cancer Paternal Grandmother   . Mental illness Paternal Grandfather   . Hypertension Paternal Grandfather   . Colon cancer Neg Hx     Social History   Socioeconomic History  . Marital status: Married    Spouse name: gabriana barros  . Number of children: 1  . Years of education: Not on file  .  Highest education level: Not on file  Occupational History  . Occupation: not working   Tobacco Use  . Smoking status: Former Smoker    Types: Cigarettes    Quit date: 05/23/1979    Years since quitting: 40.1  . Smokeless tobacco: Never Used  Substance and Sexual Activity  . Alcohol use: No    Comment: ocass  . Drug use: No  . Sexual activity: Never  Other Topics Concern  . Not on file  Social History Narrative   Husband had a stroke 4 years ago.  Daughter moved in to help them out.   Social Determinants of Health   Financial Resource Strain:   . Difficulty of Paying Living Expenses: Not on file  Food Insecurity:   . Worried About Charity fundraiser in the Last Year: Not on file  . Ran Out of Food in the Last Year: Not on file  Transportation Needs:   . Lack of Transportation (Medical): Not on file  . Lack of Transportation (Non-Medical): Not on file  Physical Activity:   . Days of Exercise per Week: Not on file  . Minutes of Exercise per Session: Not on file  Stress:   . Feeling of Stress : Not on file  Social Connections:   . Frequency of Communication with Friends and Family: Not on file  . Frequency of Social Gatherings with Friends and Family: Not on file  . Attends Religious Services: Not on file  . Active Member of Clubs or Organizations: Not on file  . Attends Archivist Meetings: Not on file  . Marital Status: Not on file  Intimate Partner Violence:   . Fear of Current or Ex-Partner: Not on file  . Emotionally Abused: Not on file  . Physically Abused: Not on file  . Sexually Abused: Not on  file    Review of Systems: ROS is O/W negative except as mentioned in HPI.  Physical Exam: Vital signs in last 24 hours: Temp:  [98.2 F (36.8 C)-99.1 F (37.3 C)] 98.2 F (36.8 C) (02/10 0518) Pulse Rate:  [76-107] 94 (02/10 0518) Resp:  [14-27] 21 (02/10 0518) BP: (140-184)/(81-97) 163/84 (02/10 0518) SpO2:  [96 %-100 %] 99 % (02/10 0518) Weight:   [74.8 kg] 74.8 kg (02/09 1555)   General:  Alert, Well-developed, well-nourished, pleasant and cooperative in NAD Head:  Normocephalic and atraumatic. Eyes:  Sclera clear, no icterus.  Conjunctiva pink. Ears:  Normal auditory acuity. Mouth:  No deformity or lesions.   Lungs:  Clear throughout to auscultation.  No wheezes, crackles, or rhonchi.  Heart:  Regular rate and rhythm; no murmurs, clicks, rubs, or gallops. Abdomen:  Soft, non-distended.  BS present.  Non-tender.  Rectal:  Deferred.  Hemoccult negative in the ED.  Msk:  Symmetrical without gross deformities. Pulses:  Normal pulses noted. Extremities:  Without clubbing or edema. Neurologic:  Alert and oriented x 4;  grossly normal neurologically. Skin:  Intact without significant lesions or rashes. Psych:  Alert and cooperative. Normal mood and affect.  Intake/Output from previous day: 02/09 0701 - 02/10 0700 In: 794.1 [I.V.:294.1; IV Piggyback:500] Out: -  Intake/Output this shift: Total I/O In: -  Out: 300 [Urine:300]  Lab Results: Recent Labs    07/01/19 1641 07/01/19 1641 07/01/19 2350 07/02/19 0147 07/02/19 0503  WBC 7.6  --  7.6 8.5  --   HGB 7.5*   < > 6.8* 6.8* 6.9*  HCT 29.5*   < > 25.9* 26.3* 27.3*  PLT 420*  --  392 422*  --    < > = values in this interval not displayed.   BMET Recent Labs    07/01/19 1641 07/01/19 2350 07/02/19 0147  NA 147*  --  145  K 3.9  --  3.8  CL 118*  --  119*  CO2 21*  --  19*  GLUCOSE 136*  --  162*  BUN 14  --  11  CREATININE 0.83 0.59 0.73  CALCIUM 9.9  --  8.9   LFT Recent Labs    07/01/19 1641  PROT 7.2  ALBUMIN 3.8  AST 20  ALT 26  ALKPHOS 51  BILITOT 0.8   Studies/Results: DG Tibia/Fibula Right  Result Date: 07/01/2019 CLINICAL DATA:  69 year old female with abrasion on the proximal tibia. EXAM: RIGHT TIBIA AND FIBULA - 2 VIEW COMPARISON:  None. FINDINGS: There is no acute fracture or dislocation. The bones are osteopenic. No significant arthritic  changes. The soft tissues are unremarkable. IMPRESSION: Negative. Electronically Signed   By: Anner Crete M.D.   On: 07/01/2019 17:51   CT Head Wo Contrast  Result Date: 07/01/2019 CLINICAL DATA:  Encephalopathy, lethargy, altered level of consciousness, somnolence EXAM: CT HEAD WITHOUT CONTRAST TECHNIQUE: Contiguous axial images were obtained from the base of the skull through the vertex without intravenous contrast. COMPARISON:  None. FINDINGS: Brain: Hypodensities in the bilateral frontal periventricular white matter and bilateral basal ganglia consistent with age-indeterminate small vessel ischemic change. No other signs of acute infarct or hemorrhage. Lateral ventricles and remaining midline structures are unremarkable. No acute extra-axial fluid collections. No mass effect. Vascular: No hyperdense vessel or unexpected calcification. Skull: Normal. Negative for fracture or focal lesion. Sinuses/Orbits: No acute finding. Other: None IMPRESSION: 1. Age-indeterminate small vessel ischemic changes within the bilateral frontal periventricular white matter and basal ganglia,  favor chronic. 2. Otherwise unremarkable exam. Electronically Signed   By: Randa Ngo M.D.   On: 07/01/2019 18:49   MR BRAIN WO CONTRAST  Result Date: 07/01/2019 CLINICAL DATA:  Encephalopathy EXAM: MRI HEAD WITHOUT CONTRAST TECHNIQUE: Multiplanar, multiecho pulse sequences of the brain and surrounding structures were obtained without intravenous contrast. COMPARISON:  None. FINDINGS: Brain: No acute infarct, acute hemorrhage or extra-axial collection. Multifocal white matter hyperintensity, most commonly due to chronic ischemic microangiopathy. Mild generalized atrophy no chronic microhemorrhage. Normal midline structures. Vascular: Normal flow voids. Skull and upper cervical spine: Mild-to-moderate narrowing of the upper cervical spinal canal. Sinuses/Orbits: Negative. Other: None. IMPRESSION: 1. No acute intracranial  abnormality. 2. Findings of chronic ischemic microangiopathy. 3. Mild-to-moderate narrowing of the upper cervical spinal canal. Electronically Signed   By: Ulyses Jarred M.D.   On: 07/01/2019 22:10   CT ABDOMEN PELVIS W CONTRAST  Result Date: 07/01/2019 CLINICAL DATA:  Flank pain. EXAM: CT ABDOMEN AND PELVIS WITH CONTRAST TECHNIQUE: Multidetector CT imaging of the abdomen and pelvis was performed using the standard protocol following bolus administration of intravenous contrast. CONTRAST:  144mL OMNIPAQUE IOHEXOL 300 MG/ML  SOLN COMPARISON:  CT scan 11/23/2014 FINDINGS: Lower chest: The lung bases are clear of an acute process. Basilar scarring changes and a somewhat mosaic pattern attenuation could be due to respiratory bronchiolitis, reactive airways disease or bronchitis. No worrisome pulmonary lesions or pleural effusion. The heart is normal in size. No pericardial effusion. Hepatobiliary: No focal hepatic lesions or intrahepatic biliary dilatation. The gallbladder surgically absent. No common bile duct dilatation. Pancreas: No mass, inflammation or ductal dilatation. Spleen: Low-attenuation lesion in the posterior aspect of the spleen is likely a benign cyst or hemangioma. There is a soft tissue mass between the spleen in the fundal region the stomach. This measures approximately 5.7 x 4.2 x 4.5 cm. Heterogeneous enhancement different than that of the spleen but it has been present since 2013 but is gradually enlarged. 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 MRI abdomen without and with contrast is recommended for further evaluation. Adrenals/Urinary Tract: The adrenal glands are unremarkable and stable. Numerous bilateral renal calculi with large staghorn type calculus noted in the lower pole region of the right kidney measuring up to 27 mm. Lower pole calculus on the left measures a maximum of 19.5 mm. No worrisome renal lesions or hydroureteronephrosis. No obstructing  ureteral calculi and no bladder calculi. No bladder mass is identified. Mild bilateral renal scarring changes. Stomach/Bowel: Small hiatal hernia but the stomach is otherwise unremarkable. The duodenum, small bowel and colon are unremarkable. No acute inflammatory changes, mass lesions or obstructive findings. The terminal ileum is normal. The appendix is normal. Vascular/Lymphatic: Moderate atherosclerotic calcifications involving the aorta but no aneurysm. The branch vessels are patent. The major venous structures are patent. No mesenteric or retroperitoneal mass or adenopathy. Reproductive: The uterus and ovaries are unremarkable. Other: No pelvic mass or adenopathy. No free pelvic fluid collections. No inguinal mass or adenopathy. No abdominal wall hernia or subcutaneous lesions. Musculoskeletal: No acute bony findings or worrisome bone lesions. Degenerative anterolisthesis of L5 is noted. IMPRESSION: 1. Numerous bilateral renal calculi but no obstructing ureteral calculi or bladder calculi. 2. No acute abdominal/pelvic findings or lymphadenopathy. 3. 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 It has gradually enlarged since 2013 and is unlikely malignant. 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 MRI abdomen without and  with contrast is recommended for further evaluation. 4. Status post cholecystectomy. No biliary dilatation. Aortic Atherosclerosis (ICD10-I70.0). Electronically Signed   By: Marijo Sanes M.D.   On: 07/01/2019 20:58   DG Chest Portable 1 View  Result Date: 07/01/2019 CLINICAL DATA:  Altered mental status. EXAM: PORTABLE CHEST 1 VIEW COMPARISON:  Radiograph 09/30/2013 FINDINGS: The cardiomediastinal contours are normal. Upper normal heart size likely accentuated by portable technique. The lungs are clear. Pulmonary vasculature is normal. No consolidation, pleural effusion, or pneumothorax. No acute osseous abnormalities are seen.  IMPRESSION: No acute chest findings. Electronically Signed   By: Keith Rake M.D.   On: 07/01/2019 17:50   VAS [place] LOWER EXTREMITY VENOUS (DVT)  Result Date: 07/02/2019  Lower Venous DVTStudy Indications: Swelling, and Edema.  Limitations: Body habitus and poor ultrasound/tissue interface. Comparison Study: 03/02/2017 Performing Technologist: Antonieta Pert RDMS, RVT  Examination Guidelines: A complete evaluation includes B-mode imaging, spectral Doppler, color Doppler, and power Doppler as needed of all accessible portions of each vessel. Bilateral testing is considered an integral part of a complete examination. Limited examinations for reoccurring indications may be performed as noted. The reflux portion of the exam is performed with the patient in reverse Trendelenburg.  +-----+---------------+---------+-----------+----------+--------------+ RIGHTCompressibilityPhasicitySpontaneityPropertiesThrombus Aging +-----+---------------+---------+-----------+----------+--------------+ CFV  Full           Yes      Yes                                 +-----+---------------+---------+-----------+----------+--------------+  +---------+---------------+---------+-----------+-----------------+------------+ LEFT     CompressibilityPhasicitySpontaneityProperties       Thrombus                                                                  Aging        +---------+---------------+---------+-----------+-----------------+------------+ CFV      Full           Yes      Yes                                      +---------+---------------+---------+-----------+-----------------+------------+ SFJ      Full                                                             +---------+---------------+---------+-----------+-----------------+------------+ FV Prox  Full                                                              +---------+---------------+---------+-----------+-----------------+------------+ FV Mid   Full                                                             +---------+---------------+---------+-----------+-----------------+------------+  FV DistalFull                                                             +---------+---------------+---------+-----------+-----------------+------------+ PFV      Full                                                             +---------+---------------+---------+-----------+-----------------+------------+ POP      Full           Yes      Yes                                      +---------+---------------+---------+-----------+-----------------+------------+ PTV      Full                                                             +---------+---------------+---------+-----------+-----------------+------------+ PERO     Full                                                             +---------+---------------+---------+-----------+-----------------+------------+ Gastroc  Partial        No       No         rigid            Acute                                                    w/compression                 +---------+---------------+---------+-----------+-----------------+------------+ GSV      Full                                                             +---------+---------------+---------+-----------+-----------------+------------+    Summary: RIGHT: - No evidence of common femoral vein obstruction.  LEFT: - Findings consistent with acute deep vein thrombosis involving the left gastrocnemius veins. - No cystic structure found in the popliteal fossa.  *See table(s) above for measurements and observations.    Preliminary     IMPRESSION:  *Anemia, microcytic:  Hgb 6.8 grams this AM down from 11.3 grams 3 years ago.  Vitamin B12 and folate levels normal.  Iron studies very low.  FOBT NEGATIVE in the ED yesterday.   Never had colonoscopy.  They  would like to give PRBC's but patient declining currently. *History of GU causing GOO in 2015.  Was due to NSAID's.  Hpylori negative.  Says that she no longer uses NSAID's , only tylenol. *New RLE DVT:  So far just received one dose of Lovenox 40 mg last night.  PLAN: -Dose of IV iron ordered (Feraheme). -Advised hospitalist to start IV heparin. -Patient trying to decide on blood transfusion and EGD/colonoscopy, which we have recommended and would like to perform tomorrow since she will need anticoagulation. -Clear liquids for now.   Laban Emperor. Zehr  07/02/2019, 9:06 AM   I have reviewed the entire case in detail with the above APP and discussed the plan in detail.  Therefore, I agree with the diagnoses recorded above. In addition,  I have personally interviewed and examined the patient.  My additional thoughts are as follows:  Acute on chronic iron deficiency anemia, reportedly heme negative.  No localizing GI symptoms. Prior history severe NSAID induced gastric ulcer, but no current symptoms of that, and patient says she has not been using aspirin or NSAIDs since that time.  Newly diagnosed DVT, needs anticoagulation.  We discussed how the iron deficiency anemia could be from poor oral intake, poor intestinal iron absorption,, but chronic GI blood loss that may be intermittent, or some combination of the above.  As such, and also because she has not had a prior colonoscopy, and given her previous history of gastric ulcer, I recommended she have an upper endoscopy and colonoscopy tomorrow prior to starting oral anticoagulation. IV heparin should be started immediately, and can be turned off several hours before those procedures.  I discussed the risks and benefits of upper endoscopy and colonoscopy in detail.  The benefits and risks of the planned procedure were described in detail with the patient or (when appropriate) their health care proxy.  Risks were  outlined as including, but not limited to, bleeding, infection, perforation, adverse medication reaction leading to cardiac or pulmonary decompensation, pancreatitis (if ERCP).  The limitation of incomplete mucosal visualization was also discussed.  No guarantees or warranties were given. Patient at increased risk for cardiopulmonary complications of procedure due to medical comorbidities.   In addition, I strongly advised her to accept transfusion of 1 unit PRBCs, as this would decrease the risks of sedation related complications for endoscopic procedures.  She was uncertain about both the procedures and the transfusion, told her she would like to wait until her daughter comes later today to make a decision.  We have respected that, but also told her we need an answer for the end of the day so we can make appropriate plans for prep and scheduling.  Meanwhile, she is on a clear liquid diet.   Nelida Meuse III Office:[phone]

## 2019-07-02 NOTE — Progress Notes (Addendum)
Grill for IV heparin Indication: new DVT in setting of anemia, hx gastric ulcers  No Known Allergies  Patient Measurements: Height: 5\' 6"  (167.6 cm) Weight: 165 lb (74.8 kg) IBW/kg (Calculated) : 59.3 Heparin Dosing Weight: TBW (used [place] for dosing)  Vital Signs: Temp: 98.5 F (36.9 C) (02/10 1025) Temp Source: Oral (02/10 1025) BP: 164/81 (02/10 1025) Pulse Rate: 89 (02/10 1025)  Labs: Recent Labs    07/01/19 1641 07/01/19 1641 07/01/19 2350 07/01/19 2350 07/02/19 0147 07/02/19 0503 07/02/19 0918  HGB 7.5*   < > 6.8*   < > 6.8* 6.9*  --   HCT 29.5*   < > 25.9*  --  26.3* 27.3*  --   PLT 420*  --  392  --  422*  --   --   LABPROT  --   --   --   --   --   --  14.2  INR  --   --   --   --   --   --  1.1  CREATININE 0.83  --  0.59  --  0.73  --   --   CKTOTAL 11*  --   --   --   --   --   --    < > = values in this interval not displayed.    Estimated Creatinine Clearance: 71.5 mL/min (by C-G formula based on SCr of 0.73 mg/dL).   Medical History: Past Medical History:  Diagnosis Date  . Anxiety and depression   . DJD (degenerative joint disease)   . GERD (gastroesophageal reflux disease)   . History of gastric ulcer    w/  bleeding-  05/ 2015  . History of kidney stones   . Hypertension 2011  . Migraines   . Nephrolithiasis    bilateral  . Osteoporosis   . Right ureteral stone   . Type 2 diabetes mellitus (HCC)     Medications:  Medications Prior to Admission  Medication Sig Dispense Refill Last Dose  . acetaminophen (TYLENOL) 500 MG tablet Take 500 mg by mouth every 6 (six) hours as needed for headache.   07/01/2019 at Unknown time  . ALPRAZolam (XANAX) 0.25 MG tablet Take 2 tablets (0.5 mg total) by mouth at bedtime as needed for anxiety. 60 tablet 0 06/30/2019 at Unknown time  . aspirin 81 MG tablet Take 81 mg by mouth daily.   06/30/2019 at Unknown time  . atorvastatin (LIPITOR) 10 MG tablet Take 1 tablet  (10 mg total) by mouth daily. 30 tablet 6 06/30/2019 at Unknown time  . buPROPion (WELLBUTRIN XL) 150 MG 24 hr tablet Take 75 mg by mouth daily.    06/30/2019 at Unknown time  . diltiazem (CARTIA XT) 240 MG 24 hr capsule Take 1 capsule (240 mg total) by mouth daily. 90 capsule 1 06/30/2019 at Unknown time  . fenofibrate micronized (LOFIBRA) 200 MG capsule Take 1 capsule (200 mg total) by mouth daily before breakfast. 90 capsule 1 06/30/2019 at Unknown time  . glimepiride (AMARYL) 4 MG tablet Take 1.5 tablets (6 mg total) by mouth daily with breakfast. 45 tablet 0 06/30/2019 at Unknown time  . ibuprofen (ADVIL) 200 MG tablet Take 200 mg by mouth every 6 (six) hours as needed for headache or moderate pain.   07/01/2019 at Unknown time  . losartan (COZAAR) 50 MG tablet Take 25 mg by mouth daily.    06/30/2019 at Unknown time  . metFORMIN (  GLUCOPHAGE) 1000 MG tablet Take 1 tablet (1,000 mg total) by mouth 2 (two) times daily with a meal. 60 tablet 0 06/30/2019 at Unknown time  . PARoxetine (PAXIL) 40 MG tablet Take 1 tablet (40 mg total) by mouth daily. 90 tablet 1 06/30/2019 at Unknown time  . cyclobenzaprine (FLEXERIL) 10 MG tablet Take 1 tablet (10 mg total) by mouth at bedtime as needed for muscle spasms. (Patient not taking: Reported on 07/01/2019) 21 tablet 0 Not Taking at Unknown time  . sitaGLIPtin (JANUVIA) 100 MG tablet Take 1 tablet (100 mg total) by mouth daily. (Patient not taking: Reported on 07/01/2019) 30 tablet 6 Not Taking at Unknown time  . zolmitriptan (ZOMIG) 5 MG tablet Take 1 tablet (5 mg total) by mouth as needed for migraine. (Patient not taking: Reported on 08/17/2015) 10 tablet 3 Not Taking at Unknown time   Scheduled:  . sodium chloride   Intravenous Once  . atorvastatin  10 mg Oral Daily  . diltiazem  240 mg Oral Daily  . fenofibrate  54 mg Oral Daily  . free water  200 mL Per Tube Q4H  . insulin aspart  0-5 Units Subcutaneous QHS  . insulin aspart  0-6 Units Subcutaneous TID WC  . losartan   25 mg Oral Daily  . oxyCODONE  10 mg Oral Q12H  . PARoxetine  40 mg Oral Daily   Infusions:  . sodium chloride 125 mL/hr at 07/02/19 0300  . ferumoxytol    . heparin     Assessment: 47 yoF with PMH DM2, HTN, HLD, bleeding gastric ulcer (2015), DJD, anxiety/depression, nephrolithiasis, sciatica, admitted 2/9 for encephalopathy and RLE pain. Found to have R calf DVT, but hemoglobin acutely down from baseline. Hx bleeding gastric ulcers, but FOBT currently negative. MRI brain negative for CVA. Pharmacy to dose IV heparin per GI recommendations.   Baseline INR WNL; aPTT not done  Prior anticoagulation: none, ASA 81 mg only  Significant events:  Today, 07/02/2019:  CBC: Hgb low at 6.8; patient still deciding on whether or not to accept transfusion; Plt borderline high  No bleeding or infusion issues per nursing  SCr stable WNL  Goal of Therapy: Heparin level 0.3-0.7 units/ml Monitor platelets by anticoagulation protocol: Yes  Plan:  Discussed with MD, will hold ASA while on heparin given no hx CAD/CVA  Will hold off on bolus given anemia and concern for occult bleeding  Heparin 1100 units/hr IV infusion  Check heparin level 8 hrs after start  Daily CBC, daily heparin level once stable  Monitor for signs of bleeding or thrombosis  If DOAC required for long-term anticoagulation, recommend Eliquis rather than Xarelto given interaction with diltiazem  Reuel Boom, PharmD, BCPS [phone] 07/02/2019, 11:27 AM

## 2019-07-02 NOTE — Progress Notes (Signed)
PROGRESS NOTE    Julie [name]  TFT:[id number] DOB: 1950-06-10 DOA: 07/01/2019 PCP: Levy Pupa, MD    Brief Narrative:  69 year old lady with prior history of type 2 diabetes mellitus, hypertension, gastric ulcer from NSAID use s/p EGD in 2015, anxiety, depression presents to ED on 9 February with right lower extremity pain and confusion.  She underwent CT of the head and MRI of the brain which were unremarkable.  Right lower extremity duplex showed DVT.  On arrival to ED she was found to be anemic with a hemoglobin of 7.5 whereas his baseline is around 11 3 years ago.  Her hemoglobin dropped to 6.8 and 1 unit of PRBC transfusion ordered.  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 It has gradually enlarged since 2013 and is unlikely malignant. 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 MRI abdomen without and with contrast is recommended for further evaluation. GI consulted for anemia.  She was started on IV heparin for the right lower extremity DVT.  IR consulted to see if she needs an IVC filter.   Assessment & Plan:   Active Problems:   Acute encephalopathy  Acute metabolic encephalopathy Probably secondary to hypernatremia, dehydration and medications. Patient is more alert today and is able to answer simple questions though she has recent memory deficits. Initial CT of the head and MRI are negative for acute intracranial abnormalities.   Acute hypertensive crisis Blood pressure parameters improved. As needed hydralazine.    Right lower extremity pain secondary to acute DVT She is started on IV heparin, possible transition to oral NOAC on discharge.    Iron deficiency anemia/microcytic anemia differential include acute anemia of blood loss versus anemia of chronic disease Baseline hemoglobin around 11.  In view of her history of gastric ulcer GI consulted and plan  for EGD and colonoscopy in the next 24 to 48 hours. 1 unit of PRBC transfusion ordered.  Repeat posttransfusion H&H Transfuse to keep hemoglobin greater than 7 Anemia panel reviewed showed low ferritin and iron levels.  IV iron ordered by GI.  Hypomagnesemia Replaced.   An episode of SVT Probably secondary to hypomagnesemia Keep potassium greater than 4 and magnesium greater than 2.   Thrombocytosis Probably secondary to iron deficiency anemia.   Diabetes mellitus Get hemoglobin A1c Continue with sliding scale insulin. CBG (last 3)  Recent Labs    07/02/19 0744 07/02/19 1124 07/02/19 1633  GLUCAP 140* 129* 146*      DVT prophylaxis: heparin IV  Code Status: (Full code.  Family Communication: discussed with daughter with phone.  Disposition Plan:  . Patient came from: Home.            . Anticipated d/c place:pending.  . Barriers to d/c OR conditions which need to be met to effect a safe d/c: pt not stable for discharge, . GI bleed, further work up to follow.    Consultants:   Gastroenterology.    Procedures: none.    Antimicrobials: none.    Subjective: Confused, but able to answer simple questions.   Objective: Vitals:   07/02/19 0518 07/02/19 1025 07/02/19 1407 07/02/19 1440  BP: (!) 163/84 (!) 164/81 (!) 179/84 (!) 167/89  Pulse: 94 89 91 85  Resp: (!) 21 (!) 22 16   Temp: 98.2 F (36.8 C) 98.5 F (36.9 C) 99.1 F (37.3 C) 98.7 F (37.1 C)  TempSrc: Oral Oral Oral Oral  SpO2: 99% 98% 98% 98%  Weight:      Height:        Intake/Output Summary (Last 24 hours) at 07/02/2019 1703 Last data filed at 07/02/2019 0716 Gross per 24 hour  Intake 794.07 ml  Output 300 ml  Net 494.07 ml   Filed Weights   07/01/19 1555  Weight: 74.8 kg    Examination:  General exam: Appears calm and comfortable  Respiratory system: Clear to auscultation. Respiratory effort normal. Cardiovascular system: S1 & S2 heard, RRR  No pedal edema. Gastrointestinal  system: Abdomen is nondistended, soft and nontender. No organomegaly or masses felt. Normal bowel sounds heard. Central nervous system: Alert but confused, only oriented to person and place.  Extremities: Symmetric 5 x 5 power. Skin: No rashes, lesions or ulcers Psychiatry:Mood & affect appropriate.     Data Reviewed: I have personally reviewed following labs and imaging studies  CBC: Recent Labs  Lab 07/01/19 1641 07/01/19 2350 07/02/19 0147 07/02/19 0503  WBC 7.6 7.6 8.5  --   NEUTROABS 5.0  --   --   --   HGB 7.5* 6.8* 6.8* 6.9*  HCT 29.5* 25.9* 26.3* 27.3*  MCV 72.5* 72.5* 72.7*  --   PLT 420* 392 422*  --    Basic Metabolic Panel: Recent Labs  Lab 07/01/19 1641 07/01/19 2350 07/02/19 0147  NA 147*  --  145  K 3.9  --  3.8  CL 118*  --  119*  CO2 21*  --  19*  GLUCOSE 136*  --  162*  BUN 14  --  11  CREATININE 0.83 0.59 0.73  CALCIUM 9.9  --  8.9  MG  --  1.4*  --   PHOS  --  2.6  --    GFR: Estimated Creatinine Clearance: 71.5 mL/min (by C-G formula based on SCr of 0.73 mg/dL). Liver Function Tests: Recent Labs  Lab 07/01/19 1641  AST 20  ALT 26  ALKPHOS 51  BILITOT 0.8  PROT 7.2  ALBUMIN 3.8   No results for input(s): LIPASE, AMYLASE in the last 168 hours. No results for input(s): AMMONIA in the last 168 hours. Coagulation Profile: Recent Labs  Lab 07/02/19 0918  INR 1.1   Cardiac Enzymes: Recent Labs  Lab 07/01/19 1641  CKTOTAL 11*   BNP (last 3 results) No results for input(s): PROBNP in the last 8760 hours. HbA1C: No results for input(s): HGBA1C in the last 72 hours. CBG: Recent Labs  Lab 07/01/19 1646 07/01/19 2321 07/02/19 0744 07/02/19 1124 07/02/19 1633  GLUCAP 130* 155* 140* 129* 146*   Lipid Profile: No results for input(s): CHOL, HDL, LDLCALC, TRIG, CHOLHDL, LDLDIRECT in the last 72 hours. Thyroid Function Tests: Recent Labs    07/02/19 0147  TSH 3.420   Anemia Panel: Recent Labs    07/01/19 1641  07/02/19 0147 07/02/19 0503  VITAMINB12 1,254* 1,152*  --   FOLATE 9.8 9.9  --   FERRITIN 9* 10*  --   TIBC 616* 560*  --   IRON 20* 24*  --   RETICCTPCT 1.6  --  1.5   Sepsis Labs: No results for input(s): PROCALCITON, LATICACIDVEN in the last 168 hours.  Recent Results (from the past 240 hour(s))  Respiratory Panel by RT PCR (Flu A&B, Covid) - Nasopharyngeal Swab     Status: None   Collection Time: 07/01/19  5:00 PM   Specimen: Nasopharyngeal Swab  Result Value Ref Range Status  SARS Coronavirus 2 by RT PCR NEGATIVE NEGATIVE Final    Comment: (NOTE) SARS-CoV-2 target nucleic acids are NOT DETECTED. The SARS-CoV-2 RNA is generally detectable in upper respiratoy specimens during the acute phase of infection. The lowest concentration of SARS-CoV-2 viral copies this assay can detect is 131 copies/mL. A negative result does not preclude SARS-Cov-2 infection and should not be used as the sole basis for treatment or other patient management decisions. A negative result may occur with  improper specimen collection/handling, submission of specimen other than nasopharyngeal swab, presence of viral mutation(s) within the areas targeted by this assay, and inadequate number of viral copies (<131 copies/mL). A negative result must be combined with clinical observations, patient history, and epidemiological information. The expected result is Negative. Fact Sheet for Patients:  PinkCheek.be Fact Sheet for Healthcare Providers:  GravelBags.it This test is not yet ap proved or cleared by the [place] FDA and  has been authorized for detection and/or diagnosis of SARS-CoV-2 by FDA under an Emergency Use Authorization (EUA). This EUA will remain  in effect (meaning this test can be used) for the duration of the COVID-19 declaration under Section 564(b)(1) of the Act, 21 U.S.C. section 360bbb-3(b)(1), unless the authorization is  terminated or revoked sooner.    Influenza A by PCR NEGATIVE NEGATIVE Final   Influenza B by PCR NEGATIVE NEGATIVE Final    Comment: (NOTE) The Xpert Xpress SARS-CoV-2/FLU/RSV assay is intended as an aid in  the diagnosis of influenza from Nasopharyngeal swab specimens and  should not be used as a sole basis for treatment. Nasal washings and  aspirates are unacceptable for Xpert Xpress SARS-CoV-2/FLU/RSV  testing. Fact Sheet for Patients: PinkCheek.be Fact Sheet for Healthcare Providers: GravelBags.it This test is not yet approved or cleared by the [place] FDA and  has been authorized for detection and/or diagnosis of SARS-CoV-2 by  FDA under an Emergency Use Authorization (EUA). This EUA will remain  in effect (meaning this test can be used) for the duration of the  Covid-19 declaration under Section 564(b)(1) of the Act, 21  U.S.C. section 360bbb-3(b)(1), unless the authorization is  terminated or revoked. Performed at [facility], [place] [address] [place], [place] [address]          Radiology Studies: DG Tibia/Fibula Right  Result Date: 07/01/2019 CLINICAL DATA:  69 year old female with abrasion on the proximal tibia. EXAM: RIGHT TIBIA AND FIBULA - 2 VIEW COMPARISON:  None. FINDINGS: There is no acute fracture or dislocation. The bones are osteopenic. No significant arthritic changes. The soft tissues are unremarkable. IMPRESSION: Negative. Electronically Signed   By: Anner Crete M.D.   On: 07/01/2019 17:51   CT Head Wo Contrast  Result Date: 07/01/2019 CLINICAL DATA:  Encephalopathy, lethargy, altered level of consciousness, somnolence EXAM: CT HEAD WITHOUT CONTRAST TECHNIQUE: Contiguous axial images were obtained from the base of the skull through the vertex without intravenous contrast. COMPARISON:  None. FINDINGS: Brain: Hypodensities in the bilateral frontal periventricular white matter  and bilateral basal ganglia consistent with age-indeterminate small vessel ischemic change. No other signs of acute infarct or hemorrhage. Lateral ventricles and remaining midline structures are unremarkable. No acute extra-axial fluid collections. No mass effect. Vascular: No hyperdense vessel or unexpected calcification. Skull: Normal. Negative for fracture or focal lesion. Sinuses/Orbits: No acute finding. Other: None IMPRESSION: 1. Age-indeterminate small vessel ischemic changes within the bilateral frontal periventricular white matter and basal ganglia, favor chronic. 2. Otherwise unremarkable exam. Electronically Signed   By: Legrand Como  Owens Shark M.D.   On: 07/01/2019 18:49   MR BRAIN WO CONTRAST  Result Date: 07/01/2019 CLINICAL DATA:  Encephalopathy EXAM: MRI HEAD WITHOUT CONTRAST TECHNIQUE: Multiplanar, multiecho pulse sequences of the brain and surrounding structures were obtained without intravenous contrast. COMPARISON:  None. FINDINGS: Brain: No acute infarct, acute hemorrhage or extra-axial collection. Multifocal white matter hyperintensity, most commonly due to chronic ischemic microangiopathy. Mild generalized atrophy no chronic microhemorrhage. Normal midline structures. Vascular: Normal flow voids. Skull and upper cervical spine: Mild-to-moderate narrowing of the upper cervical spinal canal. Sinuses/Orbits: Negative. Other: None. IMPRESSION: 1. No acute intracranial abnormality. 2. Findings of chronic ischemic microangiopathy. 3. Mild-to-moderate narrowing of the upper cervical spinal canal. Electronically Signed   By: Ulyses Jarred M.D.   On: 07/01/2019 22:10   CT ABDOMEN PELVIS W CONTRAST  Result Date: 07/01/2019 CLINICAL DATA:  Flank pain. EXAM: CT ABDOMEN AND PELVIS WITH CONTRAST TECHNIQUE: Multidetector CT imaging of the abdomen and pelvis was performed using the standard protocol following bolus administration of intravenous contrast. CONTRAST:  162m OMNIPAQUE IOHEXOL 300 MG/ML  SOLN  COMPARISON:  CT scan 11/23/2014 FINDINGS: Lower chest: The lung bases are clear of an acute process. Basilar scarring changes and a somewhat mosaic pattern attenuation could be due to respiratory bronchiolitis, reactive airways disease or bronchitis. No worrisome pulmonary lesions or pleural effusion. The heart is normal in size. No pericardial effusion. Hepatobiliary: No focal hepatic lesions or intrahepatic biliary dilatation. The gallbladder surgically absent. No common bile duct dilatation. Pancreas: No mass, inflammation or ductal dilatation. Spleen: Low-attenuation lesion in the posterior aspect of the spleen is likely a benign cyst or hemangioma. There is a soft tissue mass between the spleen in the fundal region the stomach. This measures approximately 5.7 x 4.2 x 4.5 cm. Heterogeneous enhancement different than that of the spleen but it has been present since 2013 but is gradually enlarged. 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 MRI abdomen without and with contrast is recommended for further evaluation. Adrenals/Urinary Tract: The adrenal glands are unremarkable and stable. Numerous bilateral renal calculi with large staghorn type calculus noted in the lower pole region of the right kidney measuring up to 27 mm. Lower pole calculus on the left measures a maximum of 19.5 mm. No worrisome renal lesions or hydroureteronephrosis. No obstructing ureteral calculi and no bladder calculi. No bladder mass is identified. Mild bilateral renal scarring changes. Stomach/Bowel: Small hiatal hernia but the stomach is otherwise unremarkable. The duodenum, small bowel and colon are unremarkable. No acute inflammatory changes, mass lesions or obstructive findings. The terminal ileum is normal. The appendix is normal. Vascular/Lymphatic: Moderate atherosclerotic calcifications involving the aorta but no aneurysm. The branch vessels are patent. The major venous structures are patent. No  mesenteric or retroperitoneal mass or adenopathy. Reproductive: The uterus and ovaries are unremarkable. Other: No pelvic mass or adenopathy. No free pelvic fluid collections. No inguinal mass or adenopathy. No abdominal wall hernia or subcutaneous lesions. Musculoskeletal: No acute bony findings or worrisome bone lesions. Degenerative anterolisthesis of L5 is noted. IMPRESSION: 1. Numerous bilateral renal calculi but no obstructing ureteral calculi or bladder calculi. 2. No acute abdominal/pelvic findings or lymphadenopathy. 3. 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 It has gradually enlarged since 2013 and is unlikely malignant. 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 MRI abdomen without and with contrast is recommended for further evaluation. 4. Status post cholecystectomy. No biliary  dilatation. Aortic Atherosclerosis (ICD10-I70.0). Electronically Signed   By: Marijo Sanes M.D.   On: 07/01/2019 20:58   DG Chest Portable 1 View  Result Date: 07/01/2019 CLINICAL DATA:  Altered mental status. EXAM: PORTABLE CHEST 1 VIEW COMPARISON:  Radiograph 09/30/2013 FINDINGS: The cardiomediastinal contours are normal. Upper normal heart size likely accentuated by portable technique. The lungs are clear. Pulmonary vasculature is normal. No consolidation, pleural effusion, or pneumothorax. No acute osseous abnormalities are seen. IMPRESSION: No acute chest findings. Electronically Signed   By: Keith Rake M.D.   On: 07/01/2019 17:50   VAS [place] LOWER EXTREMITY VENOUS (DVT)  Result Date: 07/02/2019  Lower Venous DVTStudy Indications: Swelling, and Edema.  Limitations: Body habitus and poor ultrasound/tissue interface. Comparison Study: 03/02/2017 Performing Technologist: Antonieta Pert RDMS, RVT  Examination Guidelines: A complete evaluation includes B-mode imaging, spectral Doppler, color Doppler, and power Doppler as needed of all accessible portions of  each vessel. Bilateral testing is considered an integral part of a complete examination. Limited examinations for reoccurring indications may be performed as noted. The reflux portion of the exam is performed with the patient in reverse Trendelenburg.  +---------+---------------+---------+-----------+-----------------+------------+ RIGHT    CompressibilityPhasicitySpontaneityProperties       Thrombus                                                                  Aging        +---------+---------------+---------+-----------+-----------------+------------+ CFV      Full           Yes      Yes                                      +---------+---------------+---------+-----------+-----------------+------------+ SFJ      Full                                                             +---------+---------------+---------+-----------+-----------------+------------+ FV Prox  Full                                                             +---------+---------------+---------+-----------+-----------------+------------+ FV Mid   Full                                                             +---------+---------------+---------+-----------+-----------------+------------+ FV DistalFull                                                             +---------+---------------+---------+-----------+-----------------+------------+  PFV      Full                                                             +---------+---------------+---------+-----------+-----------------+------------+ POP      Full           Yes      Yes                                      +---------+---------------+---------+-----------+-----------------+------------+ PTV      Full                                                             +---------+---------------+---------+-----------+-----------------+------------+ PERO     Full                                                              +---------+---------------+---------+-----------+-----------------+------------+ Gastroc  Partial        No       No         rigid            Acute                                                    w/compression                 +---------+---------------+---------+-----------+-----------------+------------+  +----+---------------+---------+-----------+----------+--------------+ LEFTCompressibilityPhasicitySpontaneityPropertiesThrombus Aging +----+---------------+---------+-----------+----------+--------------+ CFV Full           Yes      Yes                                 +----+---------------+---------+-----------+----------+--------------+ SFJ Full                                                        +----+---------------+---------+-----------+----------+--------------+   Summary: RIGHT: - Findings consistent with acute deep vein thrombosis involving the right gastrocnemius veins. - No cystic structure found in the popliteal fossa.  LEFT: - No evidence of common femoral vein obstruction.  *See table(s) above for measurements and observations.    Preliminary         Scheduled Meds: . sodium chloride   Intravenous Once  . atorvastatin  10 mg Oral Daily  . diltiazem  240 mg Oral Daily  . fenofibrate  54 mg Oral Daily  . free water  200 mL Per Tube Q4H  . insulin aspart  0-5 Units Subcutaneous QHS  . insulin  aspart  0-6 Units Subcutaneous TID WC  . losartan  25 mg Oral Daily  . oxyCODONE  10 mg Oral Q12H  . PARoxetine  40 mg Oral Daily   Continuous Infusions: . sodium chloride 125 mL/hr at 07/02/19 0300  . heparin 1,100 Units/hr (07/02/19 1249)     LOS: 0 days        Hosie Poisson, MD [facility]   To contact the attending provider between 7A-7P or the covering provider during after hours 7P-7A, please log into the web site [email or web address] and access using universal [place] password for that web site. If you do not have the password,  please call the hospital operator.  07/02/2019, 5:03 PM

## 2019-07-02 NOTE — Progress Notes (Signed)
Patient ID: Julie [name], female   DOB: [birth year], 69 y.o.   MRN: [id number] Request received for IVC filter placement in patient.  Case has been reviewed by Dr. Anselm Pancoast.  Patient also seen by GI team and IV heparin therapy has been recommended for the right lower extremity DVT(short segment involving the gastrocnemius veins).  In light of planned IV heparin therapy do not recommend filter placement at this time.  Would recommend follow-up venous Doppler in few weeks to reassess RLE DVT. MRI abd also rec for f/u of ST mass between spleen and fundal region of stomach. Please page Dr. Anselm Pancoast at [phone] with any additional questions.

## 2019-07-03 ENCOUNTER — Inpatient Hospital Stay (HOSPITAL_COMMUNITY): Payer: Medicare Other

## 2019-07-03 LAB — GLUCOSE, CAPILLARY
Glucose-Capillary: 135 mg/dL — ABNORMAL HIGH (ref 70–99)
Glucose-Capillary: 138 mg/dL — ABNORMAL HIGH (ref 70–99)
Glucose-Capillary: 180 mg/dL — ABNORMAL HIGH (ref 70–99)
Glucose-Capillary: 140 mg/dL — ABNORMAL HIGH (ref 70–99)

## 2019-07-03 LAB — BPAM RBC
Blood Product Expiration Date: 202103022359
ISSUE DATE / TIME: 202102101412
Unit Type and Rh: 600

## 2019-07-03 LAB — TYPE AND SCREEN
ABO/RH(D): A NEG
Antibody Screen: NEGATIVE
Unit division: 0

## 2019-07-03 LAB — MAGNESIUM: Magnesium: 1.7 mg/dL (ref 1.7–2.4)

## 2019-07-03 LAB — BASIC METABOLIC PANEL
Anion gap: 11 (ref 5–15)
BUN: 6 mg/dL — ABNORMAL LOW (ref 8–23)
CO2: 20 mmol/L — ABNORMAL LOW (ref 22–32)
Calcium: 9.2 mg/dL (ref 8.9–10.3)
Chloride: 108 mmol/L (ref 98–111)
Creatinine, Ser: 0.65 mg/dL (ref 0.44–1.00)
GFR calc Af Amer: >60 mL/min (ref >60)
GFR calc non Af Amer: >60 mL/min (ref >60)
Glucose, Bld: 148 mg/dL — ABNORMAL HIGH (ref 70–99)
Potassium: 3.4 mmol/L — ABNORMAL LOW (ref 3.5–5.1)
Sodium: 139 mmol/L (ref 135–145)

## 2019-07-03 LAB — CBC
HCT: 29.3 % — ABNORMAL LOW (ref 36.0–46.0)
Hemoglobin: 7.9 g/dL — ABNORMAL LOW (ref 12.0–15.0)
MCH: 20.1 pg — ABNORMAL LOW (ref 26.0–34.0)
MCHC: 27 g/dL — ABNORMAL LOW (ref 30.0–36.0)
MCV: 74.6 fL — ABNORMAL LOW (ref 80.0–100.0)
Platelets: 379 10*3/uL (ref 150–400)
RBC: 3.93 MIL/uL (ref 3.87–5.11)
RDW: 19.4 % — ABNORMAL HIGH (ref 11.5–15.5)
WBC: 9.1 10*3/uL (ref 4.0–10.5)
nRBC: 0 % (ref 0.0–0.2)

## 2019-07-03 LAB — HEMOGLOBIN A1C
Hgb A1c MFr Bld: 9.6 % — ABNORMAL HIGH (ref 4.8–5.6)
Mean Plasma Glucose: 229 mg/dL

## 2019-07-03 LAB — HEPARIN LEVEL (UNFRACTIONATED): Heparin Unfractionated: 0.35 IU/mL (ref 0.30–0.70)

## 2019-07-03 MED ORDER — POTASSIUM CHLORIDE CRYS ER 20 MEQ PO TBCR
40.0000 meq | EXTENDED_RELEASE_TABLET | Freq: Two times a day (BID) | ORAL | Status: AC
  Administered 2019-07-03 (×2): 40 meq via ORAL
  Filled 2019-07-03 (×2): qty 2

## 2019-07-03 MED ORDER — TRAZODONE HCL 50 MG PO TABS
50.0000 mg | ORAL_TABLET | Freq: Every day | ORAL | Status: AC
  Administered 2019-07-03: 50 mg via ORAL

## 2019-07-03 MED ORDER — HYDRALAZINE HCL 25 MG PO TABS
25.0000 mg | ORAL_TABLET | Freq: Three times a day (TID) | ORAL | Status: DC | PRN
  Filled 2019-07-03: qty 1

## 2019-07-03 MED ORDER — MAGNESIUM OXIDE 400 (241.3 MG) MG PO TABS
400.0000 mg | ORAL_TABLET | Freq: Two times a day (BID) | ORAL | Status: DC
  Administered 2019-07-03 – 2019-07-05 (×5): 400 mg via ORAL
  Filled 2019-07-03 (×5): qty 1

## 2019-07-03 MED ORDER — TRAZODONE HCL 50 MG PO TABS
ORAL_TABLET | ORAL | Status: AC
  Filled 2019-07-03: qty 1

## 2019-07-03 MED ORDER — PEG-KCL-NACL-NASULF-NA ASC-C 100 G PO SOLR
0.5000 | Freq: Once | ORAL | Status: AC
  Administered 2019-07-03: 100 g via ORAL
  Filled 2019-07-03: qty 1

## 2019-07-03 MED ORDER — PEG-KCL-NACL-NASULF-NA ASC-C 100 G PO SOLR: 1.0000 | Freq: Once | ORAL | Status: DC

## 2019-07-03 MED ORDER — PEG-KCL-NACL-NASULF-NA ASC-C 100 G PO SOLR: 0.5000 | Freq: Once | ORAL | Status: DC

## 2019-07-03 NOTE — Progress Notes (Signed)
ANTICOAGULATION CONSULT NOTE  Pharmacy Consult for IV heparin Indication: new DVT   No Known Allergies  Patient Measurements: Height: 5\' 6"  (167.6 cm) Weight: 165 lb (74.8 kg) IBW/kg (Calculated) : 59.3 Heparin Dosing Weight: TBW (used [place] for dosing)  Vital Signs: Temp: 98.3 F (36.8 C) (02/11 0441) Temp Source: Oral (02/10 2036) BP: 144/72 (02/11 0441) Pulse Rate: 89 (02/11 0441)  Labs: Recent Labs    07/01/19 1641 07/01/19 1641 07/01/19 2350 07/01/19 2350 07/02/19 0147 07/02/19 0147 07/02/19 0503 07/02/19 0503 07/02/19 0918 07/02/19 2007 07/03/19 0516  HGB 7.5*   < > 6.8*   < > 6.8*   < > 6.9*   < >  --  7.9* 7.9*  HCT 29.5*   < > 25.9*   < > 26.3*   < > 27.3*  --   --  28.6* 29.3*  PLT 420*   < > 392  --  422*  --   --   --   --   --  379  LABPROT  --   --   --   --   --   --   --   --  14.2  --   --   INR  --   --   --   --   --   --   --   --  1.1  --   --   HEPARINUNFRC  --   --   --   --   --   --   --   --   --  0.35 0.35  CREATININE 0.83  --  0.59  --  0.73  --   --   --   --   --   --   CKTOTAL 11*  --   --   --   --   --   --   --   --   --   --    < > = values in this interval not displayed.    Estimated Creatinine Clearance: 71.5 mL/min (by C-G formula based on SCr of 0.73 mg/dL).   Medical History: Past Medical History:  Diagnosis Date  . Anxiety and depression   . DJD (degenerative joint disease)   . GERD (gastroesophageal reflux disease)   . History of gastric ulcer    w/  bleeding-  05/ 2015  . History of kidney stones   . Hypertension 2011  . Migraines   . Nephrolithiasis    bilateral  . Osteoporosis   . Right ureteral stone   . Type 2 diabetes mellitus (HCC)     Medications:  Medications Prior to Admission  Medication Sig Dispense Refill Last Dose  . acetaminophen (TYLENOL) 500 MG tablet Take 500 mg by mouth every 6 (six) hours as needed for headache.   07/01/2019 at Unknown time  . ALPRAZolam (XANAX) 0.25 MG tablet Take 2  tablets (0.5 mg total) by mouth at bedtime as needed for anxiety. 60 tablet 0 06/30/2019 at Unknown time  . aspirin 81 MG tablet Take 81 mg by mouth daily.   06/30/2019 at Unknown time  . atorvastatin (LIPITOR) 10 MG tablet Take 1 tablet (10 mg total) by mouth daily. 30 tablet 6 06/30/2019 at Unknown time  . buPROPion (WELLBUTRIN XL) 150 MG 24 hr tablet Take 75 mg by mouth daily.    06/30/2019 at Unknown time  . diltiazem (CARTIA XT) 240 MG 24 hr capsule Take 1 capsule (240 mg total) by  mouth daily. 90 capsule 1 06/30/2019 at Unknown time  . fenofibrate micronized (LOFIBRA) 200 MG capsule Take 1 capsule (200 mg total) by mouth daily before breakfast. 90 capsule 1 06/30/2019 at Unknown time  . glimepiride (AMARYL) 4 MG tablet Take 1.5 tablets (6 mg total) by mouth daily with breakfast. 45 tablet 0 06/30/2019 at Unknown time  . ibuprofen (ADVIL) 200 MG tablet Take 200 mg by mouth every 6 (six) hours as needed for headache or moderate pain.   07/01/2019 at Unknown time  . losartan (COZAAR) 50 MG tablet Take 25 mg by mouth daily.    06/30/2019 at Unknown time  . metFORMIN (GLUCOPHAGE) 1000 MG tablet Take 1 tablet (1,000 mg total) by mouth 2 (two) times daily with a meal. 60 tablet 0 06/30/2019 at Unknown time  . PARoxetine (PAXIL) 40 MG tablet Take 1 tablet (40 mg total) by mouth daily. 90 tablet 1 06/30/2019 at Unknown time  . cyclobenzaprine (FLEXERIL) 10 MG tablet Take 1 tablet (10 mg total) by mouth at bedtime as needed for muscle spasms. (Patient not taking: Reported on 07/01/2019) 21 tablet 0 Not Taking at Unknown time  . sitaGLIPtin (JANUVIA) 100 MG tablet Take 1 tablet (100 mg total) by mouth daily. (Patient not taking: Reported on 07/01/2019) 30 tablet 6 Not Taking at Unknown time  . zolmitriptan (ZOMIG) 5 MG tablet Take 1 tablet (5 mg total) by mouth as needed for migraine. (Patient not taking: Reported on 08/17/2015) 10 tablet 3 Not Taking at Unknown time   Scheduled:  . sodium chloride   Intravenous Once  .  atorvastatin  10 mg Oral Daily  . diltiazem  240 mg Oral Daily  . fenofibrate  54 mg Oral Daily  . free water  200 mL Per Tube Q4H  . insulin aspart  0-5 Units Subcutaneous QHS  . insulin aspart  0-6 Units Subcutaneous TID WC  . losartan  25 mg Oral Daily  . oxyCODONE  10 mg Oral Q12H  . PARoxetine  40 mg Oral Daily   Infusions:  . sodium chloride 75 mL/hr at 07/03/19 0233  . heparin 1,100 Units/hr (07/02/19 1249)   Assessment: 33 yoF with PMH DM2, HTN, HLD, bleeding gastric ulcer (2015), DJD, anxiety/depression, nephrolithiasis, sciatica, admitted 2/9 for encephalopathy and RLE pain. Found to have R calf DVT, but hemoglobin acutely down from baseline. Hx bleeding gastric ulcers, but FOBT currently negative. MRI brain negative for CVA. Pharmacy to dose IV heparin per GI recommendations.   Baseline INR WNL; aPTT not done  Prior anticoagulation: none, ASA 81 mg only  Significant events:  Today, 07/03/2019:  Heparin level therapeutic at 0.35  CBC: Hgb low at 7.9 s/p 1 unit PRBC; Plt WNL  No bleeding or infusion issues per nursing  SCr stable WNL  Goal of Therapy: Heparin level 0.3-0.7 units/ml Monitor platelets by anticoagulation protocol: Yes  Plan:  Discussed with MD, will hold ASA while on heparin given no hx CAD/CVA  Continue Heparin 1100 units/hr IV infusion  Daily CBC, daily heparin level   Monitor for signs of bleeding or thrombosis  F/U plans for endoscopy/colonoscopy  If DOAC required for long-term anticoagulation, recommend Eliquis rather than Xarelto given interaction with diltiazem  Netta Cedars, PharmD, BCPS 07/03/2019@7 :16 AM

## 2019-07-03 NOTE — Plan of Care (Signed)

## 2019-07-03 NOTE — Progress Notes (Signed)
Physical Therapy Treatment Patient Details Name: Julie [name] MRN: [id number] DOB: [birth year] Today's Date: 07/03/2019    History of Present Illness Julie [name] is a 69 y.o. female admitted for RLE pain and acute encephalopathy. PMH significant for diabetes mellitus type 2, nephrolithiasis, hypertension, gastric ulcer due to NSAID's in 2015, DJD, anxiety and depression. She presented to the ED with c/o Rt LE pain that prohibited her from sleep at night prior to admission. Pt's daughter also noted that her mother seemed confused/had some altered mental status. pt confirmed to have Rt LE DVT and was anemic.    PT Comments    Patient with improved activity tolerance today. No assist required for bed mobility today and pt able to perform power up for sit<>stand without assist. Pt required min guard/assist for gait with RW and tolerated 160' with HR elevated to 114 bpm at end of ambulation. Acute PT will continue to progress pt's independence with mobility as able.   Follow Up Recommendations  Home health PT     Equipment Recommendations  None recommended by PT    Recommendations for Other Services       Precautions / Restrictions Precautions Precautions: Fall Precaution Comments: back pain Restrictions Weight Bearing Restrictions: No    Mobility  Bed Mobility Overal bed mobility: Needs Assistance Bed Mobility: Supine to Sit;Sit to Supine     Supine to sit: Supervision Sit to supine: Supervision;HOB elevated   General bed mobility comments: no assist required today, pt able to complete bed with increased time  Transfers Overall transfer level: Needs assistance Equipment used: Rolling walker (2 wheeled) Transfers: Sit to/from Stand Sit to Stand: Min guard         General transfer comment: no cues needed for safe hand placement/technique, no assist required for power up  Ambulation/Gait Ambulation/Gait assistance: Min guard;Min assist Gait Distance (Feet): 160  Feet Assistive device: Rolling walker (2 wheeled) Gait Pattern/deviations: Step-through pattern;Decreased stride length;Trunk flexed Gait velocity: decreased   General Gait Details: pt maintained safe proximity and hand placement with RW throughout, no overt LOB noted, min assist required intermittently for negotiation obstacles in hallway   Stairs             Wheelchair Mobility    Modified Rankin (Stroke Patients Only)       Balance Overall balance assessment: Needs assistance Sitting-balance support: Feet supported Sitting balance-Leahy Scale: Good     Standing balance support: During functional activity;Bilateral upper extremity supported Standing balance-Leahy Scale: Fair           Cognition Arousal/Alertness: Awake/alert Behavior During Therapy: WFL for tasks assessed/performed Overall Cognitive Status: Impaired/Different from baseline          Following Commands: Follows multi-step commands with increased time;Follows one step commands consistently;Follows multi-step commands consistently       General Comments: pt improved today for cognition, oriented to self, place, time, and situation. pt continues to require increased time for processing.      Exercises Other Exercises Other Exercises: level 2 theraband:  performed horizontal ABD, scaption, and biceps x 10 each arm    General Comments General comments (skin integrity, edema, etc.): decreased WB through Rt LE due to pain      Pertinent Vitals/Pain Pain Assessment: 0-10 Pain Score: 8  Faces Pain Scale: Hurts even more Pain Location: Rt leg/ankle Pain Descriptors / Indicators: Aching Pain Intervention(s): Limited activity within patient's tolerance;Monitored during session;Repositioned       Prior Function  PT Goals (current goals can now be found in the care plan section) Acute Rehab PT Goals Patient Stated Goal: to get back independence PT Goal Formulation: With  patient/family Time For Goal Achievement: 07/16/19 Potential to Achieve Goals: Good Progress towards PT goals: Progressing toward goals    Frequency    Min 3X/week      PT Plan Current plan remains appropriate       AM-PAC PT "6 Clicks" Mobility   Outcome Measure  Help needed turning from your back to your side while in a flat bed without using bedrails?: A Little Help needed moving from lying on your back to sitting on the side of a flat bed without using bedrails?: A Little Help needed moving to and from a bed to a chair (including a wheelchair)?: A Little Help needed standing up from a chair using your arms (e.g., wheelchair or bedside chair)?: A Little Help needed to walk in hospital room?: A Lot Help needed climbing 3-5 steps with a railing? : A Lot 6 Click Score: 16    End of Session Equipment Utilized During Treatment: Gait belt Activity Tolerance: Patient tolerated treatment well Patient left: with call bell/phone within reach;with family/visitor present;in chair;with chair alarm set Nurse Communication: Mobility status PT Visit Diagnosis: Muscle weakness (generalized) (M62.81);Difficulty in walking, not elsewhere classified (R26.2)     Time: [id number] PT Time Calculation (min) (ACUTE ONLY): 18 min  Charges:  $Gait Training: 8-22 mins                     Verner Mould, DPT Physical Therapist with [facility] [phone]  07/03/2019 6:17 PM

## 2019-07-03 NOTE — TOC Initial Note (Signed)
Transition of Care [facility]) - Initial/Assessment Note    Patient Details  Name: Julie [name] MRN: [id number] Date of Birth: 25-Mar-1951  [facility] CM/SW Contact:    Trish Mage, LCSW Phone Number: 07/03/2019, 2:24 PM  Clinical Narrative:    Patient from home with husband and adult daughter with GI bleed, significant leg pain, scheduled for EGD and colonoscopy tomorrow.  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  Has DME rollator and BSC at home.  Open to [facility] [place] PT-no preference of provider cited.  Not yet referred. TOC will continue to follow during the course of hospitalization.                 Expected Discharge Plan: [place] Barriers to Discharge: No Barriers Identified   Patient Goals and CMS Choice Patient states their goals for this hospitalization and ongoing recovery are:: "I've never been in this much pain before." CMS Medicare.gov Compare Post Acute Care list provided to:: Patient Choice offered to / list presented to : Patient  Expected Discharge Plan and Services Expected Discharge Plan: [place]   Discharge Planning Services: CM Consult Post Acute Care Choice: [place] arrangements for the past 2 months: Single Family Home                           HH Arranged: PT          Prior Living Arrangements/Services Living arrangements for the past 2 months: Single Family Home Lives with:: Spouse, Adult Children Patient language and need for interpreter reviewed:: Yes Do you feel safe going back to the place where you live?: Yes      Need for Family Participation in Patient Care: Yes (Comment) Care giver support system in place?: Yes (comment) Current home services: DME Criminal Activity/Legal Involvement Pertinent to Current Situation/Hospitalization: No - Comment as needed  Activities of Daily Living Home Assistive  Devices/Equipment: Eyeglasses, Electric scooter, CBG Meter ADL Screening (condition at time of admission) Patient's cognitive ability adequate to safely complete daily activities?: Yes Is the patient deaf or have difficulty hearing?: No Does the patient have difficulty seeing, even when wearing glasses/contacts?: No Does the patient have difficulty concentrating, remembering, or making decisions?: No Patient able to express need for assistance with ADLs?: Yes Does the patient have difficulty dressing or bathing?: No Independently performs ADLs?: No Communication: Independent Dressing (OT): Independent Grooming: Independent Feeding: Independent Bathing: Needs assistance Is this a change from baseline?: Pre-admission baseline Toileting: Needs assistance Is this a change from baseline?: Pre-admission baseline In/Out Bed: Needs assistance Is this a change from baseline?: Pre-admission baseline Walks in Home: Needs assistance Is this a change from baseline?: Pre-admission baseline Does the patient have difficulty walking or climbing stairs?: Yes Weakness of Legs: Right Weakness of Arms/Hands: None  Permission Sought/Granted            Permission granted to share info w Relationship: daughter     Emotional Assessment Appearance:: Appears stated age Attitude/Demeanor/Rapport: Engaged Affect (typically observed): Appropriate Orientation: : Oriented to Self, Oriented to Place, Oriented to  Time, Oriented to Situation Alcohol / Substance Use: Not Applicable Psych Involvement: No (comment)  Admission diagnosis:  Acute encephalopathy [G93.40] Acute deep vein thrombosis (DVT) of distal vein of right lower extremity (HCC) [I82.4Z1] Altered mental status, unspecified altered mental status type [R41.82]  Patient Active Problem List   Diagnosis Date Noted  . Acute encephalopathy 07/01/2019  . PCP NOTES >>> 03/18/2015  . Gastric ulcer 10/01/2013  . Palpitations 09/30/2013  . Annual  physical exam 07/28/2013  . Anxiety and depression 04/15/2012  . Osteoporosis 12/07/2011  . Diabetes mellitus ([place]) 07/26/2011  . HTN (hypertension) 07/26/2011  . Hyperlipidemia 07/26/2011   PCP:  Levy Pupa, MD Pharmacy:   [facility] [address] [place] [address] Phone: [phone] Fax: [phone]     Social Determinants of Health (SDOH) Interventions    Readmission Risk Interventions No flowsheet data found.

## 2019-07-03 NOTE — Progress Notes (Signed)
Occupational Therapy Treatment Patient Details Name: Julie [name] MRN: [id number] DOB: 04-20-1951 Today's Date: 07/03/2019    History of present illness Julie [name] is a 69 y.o. female admitted for RLE pain and acute encephalopathy. PMH significant for diabetes mellitus type 2, nephrolithiasis, hypertension, gastric ulcer due to NSAID's in 2015, DJD, anxiety and depression. She presented to the ED with c/o Rt LE pain that prohibited her from sleep at night prior to admission. Pt's daughter also noted that her mother seemed confused/had some altered mental status. pt confirmed to have Rt LE DVT and was anemic.   OT comments  Pt much more alert today and less assistance needed. Ambulated around bed with RW with min A, sat up in chair and performed UE exercises with level 2 theraband  Follow Up Recommendations  Supervision/Assistance - 24 hour;Home health OT    Equipment Recommendations  None recommended by OT(has all from husband)    Recommendations for Other Services      Precautions / Restrictions Precautions Precautions: Fall Precaution Comments: back pain       Mobility Bed Mobility         Supine to sit: Supervision        Transfers   Equipment used: Rolling walker (2 wheeled)   Sit to Stand: Min guard         General transfer comment: for safety; cues for UE placement    Balance     Sitting balance-Leahy Scale: Good                                     ADL either performed or assessed with clinical judgement   ADL                                         General ADL Comments: ambulated around bed to chair with min guard assistance.  Performed UE exercises with level 2 theraband. Donned socks with set up     Vision       Perception     Praxis      Cognition Arousal/Alertness: Awake/alert Behavior During Therapy: WFL for tasks assessed/performed Overall Cognitive Status: Impaired/Different from baseline                                  General Comments: pt had some difficulty making decision/processing question:  went off on tangent. Follows commands and safe wtih RW.  Cues for direction        Exercises Exercises: Other exercises Other Exercises Other Exercises: level 2 theraband:  performed horizontal ABD, scaption, and biceps x 10 each arm   Shoulder Instructions       General Comments      Pertinent Vitals/ Pain       Faces Pain Scale: Hurts even more Pain Location: Rt leg/ankle Pain Descriptors / Indicators: Aching Pain Intervention(s): Limited activity within patient's tolerance;Monitored during session;Repositioned  Home Living                                          Prior Functioning/Environment              Frequency  Progress Toward Goals  OT Goals(current goals can now be found in the care plan section)  Progress towards OT goals: Progressing toward goals     Plan      Co-evaluation                 AM-PAC OT "6 Clicks" Daily Activity     Outcome Measure   Help from another person eating meals?: None Help from another person taking care of personal grooming?: A Little Help from another person toileting, which includes using toliet, bedpan, or urinal?: A Little Help from another person bathing (including washing, rinsing, drying)?: A Little Help from another person to put on and taking off regular upper body clothing?: A Little Help from another person to put on and taking off regular lower body clothing?: A Little 6 Click Score: 19    End of Session    OT Visit Diagnosis: Unsteadiness on feet (R26.81);Pain Pain - Right/Left: Right Pain - part of body: Leg   Activity Tolerance Patient tolerated treatment well   Patient Left in chair;with call bell/phone within reach;with chair alarm set   Nurse Communication          Time: [id number] OT Time Calculation (min): 33 min  Charges: OT  General Charges $OT Visit: 1 Visit OT Treatments $Self Care/Home Management : 8-22 mins $Therapeutic Activity: 8-22 mins  [place], OTR/L Acute Rehabilitation Services 07/03/2019   Bernardo Brayman 07/03/2019, 3:30 PM

## 2019-07-03 NOTE — Progress Notes (Signed)
PROGRESS NOTE    Julie [name]  JHE:[id number] DOB: 26-Aug-1950 DOA: 07/01/2019 PCP: Levy Pupa, MD    Brief Narrative:  69 year old lady with prior history of type 2 diabetes mellitus, hypertension, gastric ulcer from NSAID use s/p EGD in 2015, anxiety, depression presents to ED on 9 February with right lower extremity pain and confusion.  She underwent CT of the head and MRI of the brain which were unremarkable.  Right lower extremity duplex showed DVT.  On arrival to ED she was found to be anemic with a hemoglobin of 7.5 whereas his baseline is around 11 3 years ago.  Her hemoglobin dropped to 6.8 and 1 unit of PRBC transfusion ordered.  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 It has gradually enlarged since 2013 and is unlikely malignant. 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 MRI abdomen without and with contrast is recommended for further evaluation. GI consulted for anemia.  She was started on IV heparin for the right lower extremity DVT.  IR consulted to see if she needs an IVC filter. No rectal bleeding noticed.    Assessment & Plan:   Active Problems:   Acute encephalopathy  Acute metabolic encephalopathy Probably secondary to hypernatremia, dehydration and medications. Patient is more alert today and is able to answer simple questions though she has significant short-term memory deficits. Initial CT of the head and MRI are negative for acute intracranial abnormalities. Patient will need outpatient follow-up with neurology for evaluation of dementia.   Acute hypertensive crisis Blood pressure parameters at 180/80 earlier this morning improved to 144/72. Continue with Cardizem to 40 mg daily.  And Cozaar 25 mg daily As needed hydralazine.    Right lower extremity pain secondary to acute DVT She is started on IV heparin, possible transition to oral NOAC on  discharge. No obvious bleeding seen today.  H&h remains stable at 7.9.    Iron deficiency anemia/microcytic anemia differential include acute anemia of blood loss versus anemia of chronic disease Baseline hemoglobin around 11.  In view of her history of gastric ulcer GI consulted and plan for EGD and colonoscopy tomorrow.  1 unit of PRBC transfusion ordered.  Repeat Hemoglobin is stable around 7.9.  Transfuse to keep hemoglobin greater than 7.  Anemia panel reviewed showed low ferritin and iron levels.  Patient received iron infusion yesterday.    Hypomagnesemia Replaced.   An episode of SVT Probably secondary to hypomagnesemia Keep potassium greater than 4 and magnesium greater than 2.   Thrombocytosis Probably secondary to iron deficiency anemia. Improved.     Diabetes mellitus well controlled CBG'S/  A1c s 9.6 Continue with sliding scale insulin. CBG (last 3)  Recent Labs    07/02/19 2037 07/03/19 0731 07/03/19 1138  GLUCAP 135* 135* 138*  no changes in medications.     DVT prophylaxis: heparin IV  Code Status: Full code.  Family Communication: discussed with daughter ON THE PHONE.  Disposition Plan:   Patient came from: Home.             Anticipated d/c place: pending.   Barriers to d/c OR conditions which need to be met to effect a safe d/c: pt not stable for discharge, . GI bleed, further work up to follow.    Consultants:   Gastroenterology.    Procedures: egd and colonoscopy scheduled for tomorrow.     Antimicrobials:  none.    Subjective: Pt confused, not in distress, reports cannot sleep at night. No chest pain or sob. No nausea, vomiting or diarrhea.   Objective: Vitals:   07/02/19 1440 07/02/19 1806 07/02/19 2036 07/03/19 0441  BP: (!) 167/89 (!) 181/83 (!) 162/82 (!) 144/72  Pulse: 85 86 86 89  Resp:  20 16 16   Temp: 98.7 F (37.1 C) 98.4 F (36.9 C) 98 F (36.7 C) 98.3 F (36.8 C)  TempSrc: Oral  Oral   SpO2: 98% 97% 97% 97%    Weight:      Height:        Intake/Output Summary (Last 24 hours) at 07/03/2019 1146 Last data filed at 07/03/2019 0347 Gross per 24 hour  Intake 371.18 ml  Output --  Net 371.18 ml   Filed Weights   07/01/19 1555  Weight: 74.8 kg    Examination:  General exam: calm and comfortable.  Respiratory system:  clear to auscultation, no wheezing or rhonchi.  Cardiovascular system: S1S2, RRR, no JVD, no pedal edema.  Gastrointestinal system: abd is soft, non tender non distended bowel sounds wnl.  Central nervous system: alert , but confused, only oriented to person and place Extremities: no pedal edema.  Skin: no rashes.  Psychiatry: Mood is appropriate.     Data Reviewed: I have personally reviewed following labs and imaging studies  CBC: Recent Labs  Lab 07/01/19 1641 07/01/19 1641 07/01/19 2350 07/02/19 0147 07/02/19 0503 07/02/19 2007 07/03/19 0516  WBC 7.6  --  7.6 8.5  --   --  9.1  NEUTROABS 5.0  --   --   --   --   --   --   HGB 7.5*   < > 6.8* 6.8* 6.9* 7.9* 7.9*  HCT 29.5*   < > 25.9* 26.3* 27.3* 28.6* 29.3*  MCV 72.5*  --  72.5* 72.7*  --   --  74.6*  PLT 420*  --  392 422*  --   --  379   < > = values in this interval not displayed.   Basic Metabolic Panel: Recent Labs  Lab 07/01/19 1641 07/01/19 2350 07/02/19 0147 07/03/19 0819  NA 147*  --  145 139  K 3.9  --  3.8 3.4*  CL 118*  --  119* 108  CO2 21*  --  19* 20*  GLUCOSE 136*  --  162* 148*  BUN 14  --  11 6*  CREATININE 0.83 0.59 0.73 0.65  CALCIUM 9.9  --  8.9 9.2  MG  --  1.4*  --  1.7  PHOS  --  2.6  --   --    GFR: Estimated Creatinine Clearance: 71.5 mL/min (by C-G formula based on SCr of 0.65 mg/dL). Liver Function Tests: Recent Labs  Lab 07/01/19 1641  AST 20  ALT 26  ALKPHOS 51  BILITOT 0.8  PROT 7.2  ALBUMIN 3.8   No results for input(s): LIPASE, AMYLASE in the last 168 hours. No results for input(s): AMMONIA in the last 168 hours. Coagulation Profile: Recent Labs   Lab 07/02/19 0918  INR 1.1   Cardiac Enzymes: Recent Labs  Lab 07/01/19 1641  CKTOTAL 11*   BNP (last 3 results) No results for input(s): PROBNP in the last 8760 hours. HbA1C: Recent Labs    07/02/19 0147  HGBA1C 9.6*   CBG: Recent Labs  Lab 07/02/19 1124 07/02/19 1633 07/02/19 2037 07/03/19 0731 07/03/19 1138  GLUCAP 129* 146* 135* 135* 138*  Lipid Profile: No results for input(s): CHOL, HDL, LDLCALC, TRIG, CHOLHDL, LDLDIRECT in the last 72 hours. Thyroid Function Tests: Recent Labs    07/02/19 0147  TSH 3.420   Anemia Panel: Recent Labs    07/01/19 1641 07/02/19 0147 07/02/19 0503  VITAMINB12 1,254* 1,152*  --   FOLATE 9.8 9.9  --   FERRITIN 9* 10*  --   TIBC 616* 560*  --   IRON 20* 24*  --   RETICCTPCT 1.6  --  1.5   Sepsis Labs: No results for input(s): PROCALCITON, LATICACIDVEN in the last 168 hours.  Recent Results (from the past 240 hour(s))  Respiratory Panel by RT PCR (Flu A&B, Covid) - Nasopharyngeal Swab     Status: None   Collection Time: 07/01/19  5:00 PM   Specimen: Nasopharyngeal Swab  Result Value Ref Range Status   SARS Coronavirus 2 by RT PCR NEGATIVE NEGATIVE Final    Comment: (NOTE) SARS-CoV-2 target nucleic acids are NOT DETECTED. The SARS-CoV-2 RNA is generally detectable in upper respiratoy specimens during the acute phase of infection. The lowest concentration of SARS-CoV-2 viral copies this assay can detect is 131 copies/mL. A negative result does not preclude SARS-Cov-2 infection and should not be used as the sole basis for treatment or other patient management decisions. A negative result may occur with  improper specimen collection/handling, submission of specimen other than nasopharyngeal swab, presence of viral mutation(s) within the areas targeted by this assay, and inadequate number of viral copies (<131 copies/mL). A negative result must be combined with clinical observations, patient history, and  epidemiological information. The expected result is Negative. Fact Sheet for Patients:  PinkCheek.be Fact Sheet for Healthcare Providers:  GravelBags.it This test is not yet ap proved or cleared by the [place] FDA and  has been authorized for detection and/or diagnosis of SARS-CoV-2 by FDA under an Emergency Use Authorization (EUA). This EUA will remain  in effect (meaning this test can be used) for the duration of the COVID-19 declaration under Section 564(b)(1) of the Act, 21 U.S.C. section 360bbb-3(b)(1), unless the authorization is terminated or revoked sooner.    Influenza A by PCR NEGATIVE NEGATIVE Final   Influenza B by PCR NEGATIVE NEGATIVE Final    Comment: (NOTE) The Xpert Xpress SARS-CoV-2/FLU/RSV assay is intended as an aid in  the diagnosis of influenza from Nasopharyngeal swab specimens and  should not be used as a sole basis for treatment. Nasal washings and  aspirates are unacceptable for Xpert Xpress SARS-CoV-2/FLU/RSV  testing. Fact Sheet for Patients: PinkCheek.be Fact Sheet for Healthcare Providers: GravelBags.it This test is not yet approved or cleared by the [place] FDA and  has been authorized for detection and/or diagnosis of SARS-CoV-2 by  FDA under an Emergency Use Authorization (EUA). This EUA will remain  in effect (meaning this test can be used) for the duration of the  Covid-19 declaration under Section 564(b)(1) of the Act, 21  U.S.C. section 360bbb-3(b)(1), unless the authorization is  terminated or revoked. Performed at [facility] [place] [address] [place], [place] [address]          Radiology Studies: DG Tibia/Fibula Right  Result Date: 07/01/2019 CLINICAL DATA:  69 year old female with abrasion on the proximal tibia. EXAM: RIGHT TIBIA AND FIBULA - 2 VIEW COMPARISON:  None. FINDINGS: There is no  acute fracture or dislocation. The bones are osteopenic. No significant arthritic changes. The soft tissues are unremarkable. IMPRESSION: Negative. Electronically Signed   By: Milas Hock  Radparvar M.D.   On: 07/01/2019 17:51   CT Head Wo Contrast  Result Date: 07/01/2019 CLINICAL DATA:  Encephalopathy, lethargy, altered level of consciousness, somnolence EXAM: CT HEAD WITHOUT CONTRAST TECHNIQUE: Contiguous axial images were obtained from the base of the skull through the vertex without intravenous contrast. COMPARISON:  None. FINDINGS: Brain: Hypodensities in the bilateral frontal periventricular white matter and bilateral basal ganglia consistent with age-indeterminate small vessel ischemic change. No other signs of acute infarct or hemorrhage. Lateral ventricles and remaining midline structures are unremarkable. No acute extra-axial fluid collections. No mass effect. Vascular: No hyperdense vessel or unexpected calcification. Skull: Normal. Negative for fracture or focal lesion. Sinuses/Orbits: No acute finding. Other: None IMPRESSION: 1. Age-indeterminate small vessel ischemic changes within the bilateral frontal periventricular white matter and basal ganglia, favor chronic. 2. Otherwise unremarkable exam. Electronically Signed   By: Randa Ngo M.D.   On: 07/01/2019 18:49   MR BRAIN WO CONTRAST  Result Date: 07/01/2019 CLINICAL DATA:  Encephalopathy EXAM: MRI HEAD WITHOUT CONTRAST TECHNIQUE: Multiplanar, multiecho pulse sequences of the brain and surrounding structures were obtained without intravenous contrast. COMPARISON:  None. FINDINGS: Brain: No acute infarct, acute hemorrhage or extra-axial collection. Multifocal white matter hyperintensity, most commonly due to chronic ischemic microangiopathy. Mild generalized atrophy no chronic microhemorrhage. Normal midline structures. Vascular: Normal flow voids. Skull and upper cervical spine: Mild-to-moderate narrowing of the upper cervical spinal canal.  Sinuses/Orbits: Negative. Other: None. IMPRESSION: 1. No acute intracranial abnormality. 2. Findings of chronic ischemic microangiopathy. 3. Mild-to-moderate narrowing of the upper cervical spinal canal. Electronically Signed   By: Ulyses Jarred M.D.   On: 07/01/2019 22:10   CT ABDOMEN PELVIS W CONTRAST  Result Date: 07/01/2019 CLINICAL DATA:  Flank pain. EXAM: CT ABDOMEN AND PELVIS WITH CONTRAST TECHNIQUE: Multidetector CT imaging of the abdomen and pelvis was performed using the standard protocol following bolus administration of intravenous contrast. CONTRAST:  153m OMNIPAQUE IOHEXOL 300 MG/ML  SOLN COMPARISON:  CT scan 11/23/2014 FINDINGS: Lower chest: The lung bases are clear of an acute process. Basilar scarring changes and a somewhat mosaic pattern attenuation could be due to respiratory bronchiolitis, reactive airways disease or bronchitis. No worrisome pulmonary lesions or pleural effusion. The heart is normal in size. No pericardial effusion. Hepatobiliary: No focal hepatic lesions or intrahepatic biliary dilatation. The gallbladder surgically absent. No common bile duct dilatation. Pancreas: No mass, inflammation or ductal dilatation. Spleen: Low-attenuation lesion in the posterior aspect of the spleen is likely a benign cyst or hemangioma. There is a soft tissue mass between the spleen in the fundal region the stomach. This measures approximately 5.7 x 4.2 x 4.5 cm. Heterogeneous enhancement different than that of the spleen but it has been present since 2013 but is gradually enlarged. 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 MRI abdomen without and with contrast is recommended for further evaluation. Adrenals/Urinary Tract: The adrenal glands are unremarkable and stable. Numerous bilateral renal calculi with large staghorn type calculus noted in the lower pole region of the right kidney measuring up to 27 mm. Lower pole calculus on the left measures a maximum of  19.5 mm. No worrisome renal lesions or hydroureteronephrosis. No obstructing ureteral calculi and no bladder calculi. No bladder mass is identified. Mild bilateral renal scarring changes. Stomach/Bowel: Small hiatal hernia but the stomach is otherwise unremarkable. The duodenum, small bowel and colon are unremarkable. No acute inflammatory changes, mass lesions or obstructive findings. The terminal ileum is normal. The appendix  is normal. Vascular/Lymphatic: Moderate atherosclerotic calcifications involving the aorta but no aneurysm. The branch vessels are patent. The major venous structures are patent. No mesenteric or retroperitoneal mass or adenopathy. Reproductive: The uterus and ovaries are unremarkable. Other: No pelvic mass or adenopathy. No free pelvic fluid collections. No inguinal mass or adenopathy. No abdominal wall hernia or subcutaneous lesions. Musculoskeletal: No acute bony findings or worrisome bone lesions. Degenerative anterolisthesis of L5 is noted. IMPRESSION: 1. Numerous bilateral renal calculi but no obstructing ureteral calculi or bladder calculi. 2. No acute abdominal/pelvic findings or lymphadenopathy. 3. 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 It has gradually enlarged since 2013 and is unlikely malignant. 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 MRI abdomen without and with contrast is recommended for further evaluation. 4. Status post cholecystectomy. No biliary dilatation. Aortic Atherosclerosis (ICD10-I70.0). Electronically Signed   By: Marijo Sanes M.D.   On: 07/01/2019 20:58   DG Chest Portable 1 View  Result Date: 07/01/2019 CLINICAL DATA:  Altered mental status. EXAM: PORTABLE CHEST 1 VIEW COMPARISON:  Radiograph 09/30/2013 FINDINGS: The cardiomediastinal contours are normal. Upper normal heart size likely accentuated by portable technique. The lungs are clear. Pulmonary vasculature is normal. No consolidation,  pleural effusion, or pneumothorax. No acute osseous abnormalities are seen. IMPRESSION: No acute chest findings. Electronically Signed   By: Keith Rake M.D.   On: 07/01/2019 17:50   VAS [place] LOWER EXTREMITY VENOUS (DVT)  Result Date: 07/03/2019  Lower Venous DVTStudy Indications: Swelling, and Edema.  Limitations: Body habitus and poor ultrasound/tissue interface. Comparison Study: 03/02/2017 Performing Technologist: Antonieta Pert RDMS, RVT  Examination Guidelines: A complete evaluation includes B-mode imaging, spectral Doppler, color Doppler, and power Doppler as needed of all accessible portions of each vessel. Bilateral testing is considered an integral part of a complete examination. Limited examinations for reoccurring indications may be performed as noted. The reflux portion of the exam is performed with the patient in reverse Trendelenburg.  +---------+---------------+---------+-----------+-----------------+------------+  RIGHT     Compressibility Phasicity Spontaneity Properties        Thrombus                                                                         Aging         +---------+---------------+---------+-----------+-----------------+------------+  CFV       Full            Yes       Yes                                         +---------+---------------+---------+-----------+-----------------+------------+  SFJ       Full                                                                  +---------+---------------+---------+-----------+-----------------+------------+  FV Prox   Full                                                                  +---------+---------------+---------+-----------+-----------------+------------+  FV Mid    Full                                                                  +---------+---------------+---------+-----------+-----------------+------------+  FV Distal Full                                                                   +---------+---------------+---------+-----------+-----------------+------------+  PFV       Full                                                                  +---------+---------------+---------+-----------+-----------------+------------+  POP       Full            Yes       Yes                                         +---------+---------------+---------+-----------+-----------------+------------+  PTV       Full                                                                  +---------+---------------+---------+-----------+-----------------+------------+  PERO      Full                                                                  +---------+---------------+---------+-----------+-----------------+------------+  Gastroc   Partial         No        No          rigid             Acute                                                          w/compression                   +---------+---------------+---------+-----------+-----------------+------------+  +----+---------------+---------+-----------+----------+--------------+  LEFT Compressibility Phasicity Spontaneity Properties Thrombus Aging  +----+---------------+---------+-----------+----------+--------------+  CFV  Full            Yes       Yes                                    +----+---------------+---------+-----------+----------+--------------+  SFJ  Full                                                             +----+---------------+---------+-----------+----------+--------------+   Summary: RIGHT: - Findings consistent with acute deep vein thrombosis involving the right gastrocnemius veins. - No cystic structure found in the popliteal fossa.  LEFT: - No evidence of common femoral vein obstruction.  *See table(s) above for measurements and observations. Electronically signed by Harold Barban MD on 07/03/2019 at 7:29:54 AM.    Final         Scheduled Meds:  sodium chloride   Intravenous Once   atorvastatin  10 mg Oral Daily   diltiazem  240  mg Oral Daily   fenofibrate  54 mg Oral Daily   free water  200 mL Per Tube Q4H   insulin aspart  0-5 Units Subcutaneous QHS   insulin aspart  0-6 Units Subcutaneous TID WC   losartan  25 mg Oral Daily   magnesium oxide  400 mg Oral BID   PARoxetine  40 mg Oral Daily   peg 3350 powder  0.5 kit Oral Once   And   [START ON 07/04/2019] peg 3350 powder  0.5 kit Oral Once   potassium chloride  40 mEq Oral BID   Continuous Infusions:  sodium chloride 75 mL/hr at 07/03/19 0233   heparin 1,100 Units/hr (07/02/19 1249)     LOS: 1 day        Hosie Poisson, MD [facility]   To contact the attending provider between 7A-7P or the covering provider during after hours 7P-7A, please log into the web site [email or web address] and access using universal  password for that web site. If you do not have the password, please call the hospital operator.  07/03/2019, 11:46 AM

## 2019-07-03 NOTE — Progress Notes (Addendum)
[facility] Progress Note  CC:  IDA  Subjective:  Leg is hurting this AM.  She is willing to proceed with EGD and colonoscopy tomorrow AM.  Received a unit of blood yesterday and dose of Feraheme.  Objective:  Vital signs in last 24 hours: Temp:  [98 F (36.7 C)-99.1 F (37.3 C)] 98.3 F (36.8 C) (02/11 0441) Pulse Rate:  [85-91] 89 (02/11 0441) Resp:  [16-22] 16 (02/11 0441) BP: (144-181)/(72-89) 144/72 (02/11 0441) SpO2:  [97 %-98 %] 97 % (02/11 0441)   General:  Alert, Well-developed, in NAD Heart:  Regular rate and rhythm; no murmurs Pulm:  CTAB.  No increased WOB. Abdomen:  Soft, non-distended.  BS present.  Non-tender. Neurologic:  Alert and oriented x 4;  grossly normal neurologically.  Intake/Output from previous day: 02/10 0701 - 02/11 0700 In: 371.2 [I.V.:371.2] Out: 300 [Urine:300]  Lab Results: Recent Labs    07/01/19 2350 07/01/19 2350 07/02/19 0147 07/02/19 0147 07/02/19 0503 07/02/19 2007 07/03/19 0516  WBC 7.6  --  8.5  --   --   --  9.1  HGB 6.8*   < > 6.8*   < > 6.9* 7.9* 7.9*  HCT 25.9*   < > 26.3*   < > 27.3* 28.6* 29.3*  PLT 392  --  422*  --   --   --  379   < > = values in this interval not displayed.   BMET Recent Labs    07/01/19 1641 07/01/19 1641 07/01/19 2350 07/02/19 0147 07/03/19 0819  NA 147*  --   --  145 139  K 3.9  --   --  3.8 3.4*  CL 118*  --   --  119* 108  CO2 21*  --   --  19* 20*  GLUCOSE 136*  --   --  162* 148*  BUN 14  --   --  11 6*  CREATININE 0.83   < > 0.59 0.73 0.65  CALCIUM 9.9  --   --  8.9 9.2   < > = values in this interval not displayed.   LFT Recent Labs    07/01/19 1641  PROT 7.2  ALBUMIN 3.8  AST 20  ALT 26  ALKPHOS 51  BILITOT 0.8   PT/INR Recent Labs    07/02/19 0918  LABPROT 14.2  INR 1.1   DG Tibia/Fibula Right  Result Date: 07/01/2019 CLINICAL DATA:  69 year old female with abrasion on the proximal tibia. EXAM: RIGHT TIBIA AND FIBULA - 2 VIEW COMPARISON:   None. FINDINGS: There is no acute fracture or dislocation. The bones are osteopenic. No significant arthritic changes. The soft tissues are unremarkable. IMPRESSION: Negative. Electronically Signed   By: Anner Crete M.D.   On: 07/01/2019 17:51   CT Head Wo Contrast  Result Date: 07/01/2019 CLINICAL DATA:  Encephalopathy, lethargy, altered level of consciousness, somnolence EXAM: CT HEAD WITHOUT CONTRAST TECHNIQUE: Contiguous axial images were obtained from the base of the skull through the vertex without intravenous contrast. COMPARISON:  None. FINDINGS: Brain: Hypodensities in the bilateral frontal periventricular white matter and bilateral basal ganglia consistent with age-indeterminate small vessel ischemic change. No other signs of acute infarct or hemorrhage. Lateral ventricles and remaining midline structures are unremarkable. No acute extra-axial fluid collections. No mass effect. Vascular: No hyperdense vessel or unexpected calcification. Skull: Normal. Negative for fracture or focal lesion. Sinuses/Orbits: No acute finding. Other: None IMPRESSION: 1. Age-indeterminate small vessel ischemic changes within the bilateral  frontal periventricular white matter and basal ganglia, favor chronic. 2. Otherwise unremarkable exam. Electronically Signed   By: Randa Ngo M.D.   On: 07/01/2019 18:49   MR BRAIN WO CONTRAST  Result Date: 07/01/2019 CLINICAL DATA:  Encephalopathy EXAM: MRI HEAD WITHOUT CONTRAST TECHNIQUE: Multiplanar, multiecho pulse sequences of the brain and surrounding structures were obtained without intravenous contrast. COMPARISON:  None. FINDINGS: Brain: No acute infarct, acute hemorrhage or extra-axial collection. Multifocal white matter hyperintensity, most commonly due to chronic ischemic microangiopathy. Mild generalized atrophy no chronic microhemorrhage. Normal midline structures. Vascular: Normal flow voids. Skull and upper cervical spine: Mild-to-moderate narrowing of the upper  cervical spinal canal. Sinuses/Orbits: Negative. Other: None. IMPRESSION: 1. No acute intracranial abnormality. 2. Findings of chronic ischemic microangiopathy. 3. Mild-to-moderate narrowing of the upper cervical spinal canal. Electronically Signed   By: Ulyses Jarred M.D.   On: 07/01/2019 22:10   CT ABDOMEN PELVIS W CONTRAST  Result Date: 07/01/2019 CLINICAL DATA:  Flank pain. EXAM: CT ABDOMEN AND PELVIS WITH CONTRAST TECHNIQUE: Multidetector CT imaging of the abdomen and pelvis was performed using the standard protocol following bolus administration of intravenous contrast. CONTRAST:  187mL OMNIPAQUE IOHEXOL 300 MG/ML  SOLN COMPARISON:  CT scan 11/23/2014 FINDINGS: Lower chest: The lung bases are clear of an acute process. Basilar scarring changes and a somewhat mosaic pattern attenuation could be due to respiratory bronchiolitis, reactive airways disease or bronchitis. No worrisome pulmonary lesions or pleural effusion. The heart is normal in size. No pericardial effusion. Hepatobiliary: No focal hepatic lesions or intrahepatic biliary dilatation. The gallbladder surgically absent. No common bile duct dilatation. Pancreas: No mass, inflammation or ductal dilatation. Spleen: Low-attenuation lesion in the posterior aspect of the spleen is likely a benign cyst or hemangioma. There is a soft tissue mass between the spleen in the fundal region the stomach. This measures approximately 5.7 x 4.2 x 4.5 cm. Heterogeneous enhancement different than that of the spleen but it has been present since 2013 but is gradually enlarged. 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 MRI abdomen without and with contrast is recommended for further evaluation. Adrenals/Urinary Tract: The adrenal glands are unremarkable and stable. Numerous bilateral renal calculi with large staghorn type calculus noted in the lower pole region of the right kidney measuring up to 27 mm. Lower pole calculus on the left  measures a maximum of 19.5 mm. No worrisome renal lesions or hydroureteronephrosis. No obstructing ureteral calculi and no bladder calculi. No bladder mass is identified. Mild bilateral renal scarring changes. Stomach/Bowel: Small hiatal hernia but the stomach is otherwise unremarkable. The duodenum, small bowel and colon are unremarkable. No acute inflammatory changes, mass lesions or obstructive findings. The terminal ileum is normal. The appendix is normal. Vascular/Lymphatic: Moderate atherosclerotic calcifications involving the aorta but no aneurysm. The branch vessels are patent. The major venous structures are patent. No mesenteric or retroperitoneal mass or adenopathy. Reproductive: The uterus and ovaries are unremarkable. Other: No pelvic mass or adenopathy. No free pelvic fluid collections. No inguinal mass or adenopathy. No abdominal wall hernia or subcutaneous lesions. Musculoskeletal: No acute bony findings or worrisome bone lesions. Degenerative anterolisthesis of L5 is noted. IMPRESSION: 1. Numerous bilateral renal calculi but no obstructing ureteral calculi or bladder calculi. 2. No acute abdominal/pelvic findings or lymphadenopathy. 3. 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 It has gradually enlarged since 2013 and is unlikely malignant. 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 MRI abdomen without and with contrast is recommended for further evaluation. 4. Status post cholecystectomy. No biliary dilatation. Aortic Atherosclerosis (ICD10-I70.0). Electronically Signed   By: Marijo Sanes M.D.   On: 07/01/2019 20:58   DG Chest Portable 1 View  Result Date: 07/01/2019 CLINICAL DATA:  Altered mental status. EXAM: PORTABLE CHEST 1 VIEW COMPARISON:  Radiograph 09/30/2013 FINDINGS: The cardiomediastinal contours are normal. Upper normal heart size likely accentuated by portable technique. The lungs are clear. Pulmonary vasculature is normal.  No consolidation, pleural effusion, or pneumothorax. No acute osseous abnormalities are seen. IMPRESSION: No acute chest findings. Electronically Signed   By: Keith Rake M.D.   On: 07/01/2019 17:50   VAS [place] LOWER EXTREMITY VENOUS (DVT)  Result Date: 07/03/2019  Lower Venous DVTStudy Indications: Swelling, and Edema.  Limitations: Body habitus and poor ultrasound/tissue interface. Comparison Study: 03/02/2017 Performing Technologist: Antonieta Pert RDMS, RVT  Examination Guidelines: A complete evaluation includes B-mode imaging, spectral Doppler, color Doppler, and power Doppler as needed of all accessible portions of each vessel. Bilateral testing is considered an integral part of a complete examination. Limited examinations for reoccurring indications may be performed as noted. The reflux portion of the exam is performed with the patient in reverse Trendelenburg.  +---------+---------------+---------+-----------+-----------------+------------+  RIGHT     Compressibility Phasicity Spontaneity Properties        Thrombus                                                                         Aging         +---------+---------------+---------+-----------+-----------------+------------+  CFV       Full            Yes       Yes                                         +---------+---------------+---------+-----------+-----------------+------------+  SFJ       Full                                                                  +---------+---------------+---------+-----------+-----------------+------------+  FV Prox   Full                                                                  +---------+---------------+---------+-----------+-----------------+------------+  FV Mid    Full                                                                  +---------+---------------+---------+-----------+-----------------+------------+  FV Distal Full                                                                   +---------+---------------+---------+-----------+-----------------+------------+  PFV       Full                                                                  +---------+---------------+---------+-----------+-----------------+------------+  POP       Full            Yes       Yes                                         +---------+---------------+---------+-----------+-----------------+------------+  PTV       Full                                                                  +---------+---------------+---------+-----------+-----------------+------------+  PERO      Full                                                                  +---------+---------------+---------+-----------+-----------------+------------+  Gastroc   Partial         No        No          rigid             Acute                                                          w/compression                   +---------+---------------+---------+-----------+-----------------+------------+  +----+---------------+---------+-----------+----------+--------------+  LEFT Compressibility Phasicity Spontaneity Properties Thrombus Aging  +----+---------------+---------+-----------+----------+--------------+  CFV  Full            Yes       Yes                                    +----+---------------+---------+-----------+----------+--------------+  SFJ  Full                                                             +----+---------------+---------+-----------+----------+--------------+  Summary: RIGHT: - Findings consistent with acute deep vein thrombosis involving the right gastrocnemius veins. - No cystic structure found in the popliteal fossa.  LEFT: - No evidence of common femoral vein obstruction.  *See table(s) above for measurements and observations. Electronically signed by Harold Barban MD on 07/03/2019 at 7:29:54 AM.    Final    Assessment / Plan: *Anemia, microcytic, iron deficient:  Acute on chronic.  FOBT negative.  No sign of overt  bleeding.  Hgb improved one gram to 7.9 grams and is stable after one unit of blood yesterday.  Received dose of Feraheme as well.  Never had colonoscopy. *History of GU causing GOO in 2015.  Was due to NSAID's.  Hpylori negative.  Says that she no longer uses NSAID's, only tylenol. *New RLE DVT:  On IV heparin gtt.  **Patient agreeable to EGD and colonoscopy tomorrow at 8 AM. **Will hold heparin at 4 AM for procedure.   LOS: 1 day   Julie [name]. Julie [name]  07/03/2019, 9:18 AM  I have discussed the case with the PA, and that is the plan I formulated. I personally interviewed and examined the patient.  Add'l ROS:  Denies chest pain, dyspnea or dysuria  Chronic IDA, heme negative.  Improved after 1 unit PRBCs yesterday.  New DVT, needs OAC. EGD/colonoscopy tomorrow to rule out source of occult GI blood loss prior to [facility].   Total time 20 minutes   Nelida Meuse III Office: [phone]

## 2019-07-03 NOTE — H&P (View-Only) (Signed)
[facility] Progress Note  CC:  IDA  Subjective:  Leg is hurting this AM.  She is willing to proceed with EGD and colonoscopy tomorrow AM.  Received a unit of blood yesterday and dose of Feraheme.  Objective:  Vital signs in last 24 hours: Temp:  [98 F (36.7 C)-99.1 F (37.3 C)] 98.3 F (36.8 C) (02/11 0441) Pulse Rate:  [85-91] 89 (02/11 0441) Resp:  [16-22] 16 (02/11 0441) BP: (144-181)/(72-89) 144/72 (02/11 0441) SpO2:  [97 %-98 %] 97 % (02/11 0441)   General:  Alert, Well-developed, in NAD Heart:  Regular rate and rhythm; no murmurs Pulm:  CTAB.  No increased WOB. Abdomen:  Soft, non-distended.  BS present.  Non-tender. Neurologic:  Alert and oriented x 4;  grossly normal neurologically.  Intake/Output from previous day: 02/10 0701 - 02/11 0700 In: 371.2 [I.V.:371.2] Out: 300 [Urine:300]  Lab Results: Recent Labs    07/01/19 2350 07/01/19 2350 07/02/19 0147 07/02/19 0147 07/02/19 0503 07/02/19 2007 07/03/19 0516  WBC 7.6  --  8.5  --   --   --  9.1  HGB 6.8*   < > 6.8*   < > 6.9* 7.9* 7.9*  HCT 25.9*   < > 26.3*   < > 27.3* 28.6* 29.3*  PLT 392  --  422*  --   --   --  379   < > = values in this interval not displayed.   BMET Recent Labs    07/01/19 1641 07/01/19 1641 07/01/19 2350 07/02/19 0147 07/03/19 0819  NA 147*  --   --  145 139  K 3.9  --   --  3.8 3.4*  CL 118*  --   --  119* 108  CO2 21*  --   --  19* 20*  GLUCOSE 136*  --   --  162* 148*  BUN 14  --   --  11 6*  CREATININE 0.83   < > 0.59 0.73 0.65  CALCIUM 9.9  --   --  8.9 9.2   < > = values in this interval not displayed.   LFT Recent Labs    07/01/19 1641  PROT 7.2  ALBUMIN 3.8  AST 20  ALT 26  ALKPHOS 51  BILITOT 0.8   PT/INR Recent Labs    07/02/19 0918  LABPROT 14.2  INR 1.1   DG Tibia/Fibula Right  Result Date: 07/01/2019 CLINICAL DATA:  69 year old female with abrasion on the proximal tibia. EXAM: RIGHT TIBIA AND FIBULA - 2 VIEW COMPARISON:   None. FINDINGS: There is no acute fracture or dislocation. The bones are osteopenic. No significant arthritic changes. The soft tissues are unremarkable. IMPRESSION: Negative. Electronically Signed   By: Anner Crete M.D.   On: 07/01/2019 17:51   CT Head Wo Contrast  Result Date: 07/01/2019 CLINICAL DATA:  Encephalopathy, lethargy, altered level of consciousness, somnolence EXAM: CT HEAD WITHOUT CONTRAST TECHNIQUE: Contiguous axial images were obtained from the base of the skull through the vertex without intravenous contrast. COMPARISON:  None. FINDINGS: Brain: Hypodensities in the bilateral frontal periventricular white matter and bilateral basal ganglia consistent with age-indeterminate small vessel ischemic change. No other signs of acute infarct or hemorrhage. Lateral ventricles and remaining midline structures are unremarkable. No acute extra-axial fluid collections. No mass effect. Vascular: No hyperdense vessel or unexpected calcification. Skull: Normal. Negative for fracture or focal lesion. Sinuses/Orbits: No acute finding. Other: None IMPRESSION: 1. Age-indeterminate small vessel ischemic changes within the bilateral  frontal periventricular white matter and basal ganglia, favor chronic. 2. Otherwise unremarkable exam. Electronically Signed   By: Randa Ngo M.D.   On: 07/01/2019 18:49   MR BRAIN WO CONTRAST  Result Date: 07/01/2019 CLINICAL DATA:  Encephalopathy EXAM: MRI HEAD WITHOUT CONTRAST TECHNIQUE: Multiplanar, multiecho pulse sequences of the brain and surrounding structures were obtained without intravenous contrast. COMPARISON:  None. FINDINGS: Brain: No acute infarct, acute hemorrhage or extra-axial collection. Multifocal white matter hyperintensity, most commonly due to chronic ischemic microangiopathy. Mild generalized atrophy no chronic microhemorrhage. Normal midline structures. Vascular: Normal flow voids. Skull and upper cervical spine: Mild-to-moderate narrowing of the upper  cervical spinal canal. Sinuses/Orbits: Negative. Other: None. IMPRESSION: 1. No acute intracranial abnormality. 2. Findings of chronic ischemic microangiopathy. 3. Mild-to-moderate narrowing of the upper cervical spinal canal. Electronically Signed   By: Ulyses Jarred M.D.   On: 07/01/2019 22:10   CT ABDOMEN PELVIS W CONTRAST  Result Date: 07/01/2019 CLINICAL DATA:  Flank pain. EXAM: CT ABDOMEN AND PELVIS WITH CONTRAST TECHNIQUE: Multidetector CT imaging of the abdomen and pelvis was performed using the standard protocol following bolus administration of intravenous contrast. CONTRAST:  154mL OMNIPAQUE IOHEXOL 300 MG/ML  SOLN COMPARISON:  CT scan 11/23/2014 FINDINGS: Lower chest: The lung bases are clear of an acute process. Basilar scarring changes and a somewhat mosaic pattern attenuation could be due to respiratory bronchiolitis, reactive airways disease or bronchitis. No worrisome pulmonary lesions or pleural effusion. The heart is normal in size. No pericardial effusion. Hepatobiliary: No focal hepatic lesions or intrahepatic biliary dilatation. The gallbladder surgically absent. No common bile duct dilatation. Pancreas: No mass, inflammation or ductal dilatation. Spleen: Low-attenuation lesion in the posterior aspect of the spleen is likely a benign cyst or hemangioma. There is a soft tissue mass between the spleen in the fundal region the stomach. This measures approximately 5.7 x 4.2 x 4.5 cm. Heterogeneous enhancement different than that of the spleen but it has been present since 2013 but is gradually enlarged. 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 MRI abdomen without and with contrast is recommended for further evaluation. Adrenals/Urinary Tract: The adrenal glands are unremarkable and stable. Numerous bilateral renal calculi with large staghorn type calculus noted in the lower pole region of the right kidney measuring up to 27 mm. Lower pole calculus on the left  measures a maximum of 19.5 mm. No worrisome renal lesions or hydroureteronephrosis. No obstructing ureteral calculi and no bladder calculi. No bladder mass is identified. Mild bilateral renal scarring changes. Stomach/Bowel: Small hiatal hernia but the stomach is otherwise unremarkable. The duodenum, small bowel and colon are unremarkable. No acute inflammatory changes, mass lesions or obstructive findings. The terminal ileum is normal. The appendix is normal. Vascular/Lymphatic: Moderate atherosclerotic calcifications involving the aorta but no aneurysm. The branch vessels are patent. The major venous structures are patent. No mesenteric or retroperitoneal mass or adenopathy. Reproductive: The uterus and ovaries are unremarkable. Other: No pelvic mass or adenopathy. No free pelvic fluid collections. No inguinal mass or adenopathy. No abdominal wall hernia or subcutaneous lesions. Musculoskeletal: No acute bony findings or worrisome bone lesions. Degenerative anterolisthesis of L5 is noted. IMPRESSION: 1. Numerous bilateral renal calculi but no obstructing ureteral calculi or bladder calculi. 2. No acute abdominal/pelvic findings or lymphadenopathy. 3. 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 It has gradually enlarged since 2013 and is unlikely malignant. 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 MRI abdomen without and with contrast is recommended for further evaluation. 4. Status post cholecystectomy. No biliary dilatation. Aortic Atherosclerosis (ICD10-I70.0). Electronically Signed   By: Marijo Sanes M.D.   On: 07/01/2019 20:58   DG Chest Portable 1 View  Result Date: 07/01/2019 CLINICAL DATA:  Altered mental status. EXAM: PORTABLE CHEST 1 VIEW COMPARISON:  Radiograph 09/30/2013 FINDINGS: The cardiomediastinal contours are normal. Upper normal heart size likely accentuated by portable technique. The lungs are clear. Pulmonary vasculature is normal.  No consolidation, pleural effusion, or pneumothorax. No acute osseous abnormalities are seen. IMPRESSION: No acute chest findings. Electronically Signed   By: Keith Rake M.D.   On: 07/01/2019 17:50   VAS [place] LOWER EXTREMITY VENOUS (DVT)  Result Date: 07/03/2019  Lower Venous DVTStudy Indications: Swelling, and Edema.  Limitations: Body habitus and poor ultrasound/tissue interface. Comparison Study: 03/02/2017 Performing Technologist: Antonieta Pert RDMS, RVT  Examination Guidelines: A complete evaluation includes B-mode imaging, spectral Doppler, color Doppler, and power Doppler as needed of all accessible portions of each vessel. Bilateral testing is considered an integral part of a complete examination. Limited examinations for reoccurring indications may be performed as noted. The reflux portion of the exam is performed with the patient in reverse Trendelenburg.  +---------+---------------+---------+-----------+-----------------+------------+ RIGHT    CompressibilityPhasicitySpontaneityProperties       Thrombus                                                                  Aging        +---------+---------------+---------+-----------+-----------------+------------+ CFV      Full           Yes      Yes                                      +---------+---------------+---------+-----------+-----------------+------------+ SFJ      Full                                                             +---------+---------------+---------+-----------+-----------------+------------+ FV Prox  Full                                                             +---------+---------------+---------+-----------+-----------------+------------+ FV Mid   Full                                                             +---------+---------------+---------+-----------+-----------------+------------+ FV DistalFull                                                              +---------+---------------+---------+-----------+-----------------+------------+  PFV      Full                                                             +---------+---------------+---------+-----------+-----------------+------------+ POP      Full           Yes      Yes                                      +---------+---------------+---------+-----------+-----------------+------------+ PTV      Full                                                             +---------+---------------+---------+-----------+-----------------+------------+ PERO     Full                                                             +---------+---------------+---------+-----------+-----------------+------------+ Gastroc  Partial        No       No         rigid            Acute                                                    w/compression                 +---------+---------------+---------+-----------+-----------------+------------+  +----+---------------+---------+-----------+----------+--------------+ LEFTCompressibilityPhasicitySpontaneityPropertiesThrombus Aging +----+---------------+---------+-----------+----------+--------------+ CFV Full           Yes      Yes                                 +----+---------------+---------+-----------+----------+--------------+ SFJ Full                                                        +----+---------------+---------+-----------+----------+--------------+   Summary: RIGHT: - Findings consistent with acute deep vein thrombosis involving the right gastrocnemius veins. - No cystic structure found in the popliteal fossa.  LEFT: - No evidence of common femoral vein obstruction.  *See table(s) above for measurements and observations. Electronically signed by Harold Barban MD on 07/03/2019 at 7:29:54 AM.    Final    Assessment / Plan: *Anemia, microcytic, iron deficient:  Acute on chronic.  FOBT negative.  No sign of overt  bleeding.  Hgb improved one gram to 7.9 grams and is stable after one unit of blood yesterday.  Received dose of Feraheme as well.  Never had colonoscopy. *History  of GU causing GOO in 2015.  Was due to NSAID's.  Hpylori negative.  Says that she no longer uses NSAID's, only tylenol. *New RLE DVT:  On IV heparin gtt.  **Patient agreeable to EGD and colonoscopy tomorrow at 8 AM. **Will hold heparin at 4 AM for procedure.   LOS: 1 day   Laban Emperor. Zehr  07/03/2019, 9:18 AM  I have discussed the case with the PA, and that is the plan I formulated. I personally interviewed and examined the patient.  Add'l ROS:  Denies chest pain, dyspnea or dysuria  Chronic IDA, heme negative.  Improved after 1 unit PRBCs yesterday.  New DVT, needs OAC. EGD/colonoscopy tomorrow to rule out source of occult GI blood loss prior to [facility].   Total time 20 minutes   Nelida Meuse III Office: [phone]

## 2019-07-04 ENCOUNTER — Inpatient Hospital Stay (HOSPITAL_COMMUNITY): Payer: Medicare Other

## 2019-07-04 ENCOUNTER — Inpatient Hospital Stay (HOSPITAL_COMMUNITY): Payer: Medicare Other | Admitting: Anesthesiology

## 2019-07-04 ENCOUNTER — Encounter (HOSPITAL_COMMUNITY): Payer: Self-pay | Admitting: Internal Medicine

## 2019-07-04 ENCOUNTER — Encounter (HOSPITAL_COMMUNITY)
Admission: EM | Disposition: A | Discharge status: Home Health | Payer: Self-pay | Source: Home / Self Care | Attending: Internal Medicine

## 2019-07-04 DIAGNOSIS — K2100 Gastro-esophageal reflux disease with esophagitis, without bleeding: Secondary | ICD-10-CM

## 2019-07-04 DIAGNOSIS — K269 Duodenal ulcer, unspecified as acute or chronic, without hemorrhage or perforation: Secondary | ICD-10-CM

## 2019-07-04 HISTORY — PX: BIOPSY: SHX5522

## 2019-07-04 HISTORY — PX: ESOPHAGOGASTRODUODENOSCOPY (EGD) WITH PROPOFOL: SHX5813

## 2019-07-04 LAB — BASIC METABOLIC PANEL WITH GFR
Anion gap: 10 (ref 5–15)
BUN: <5 mg/dL — ABNORMAL LOW (ref 8–23)
CO2: 20 mmol/L — ABNORMAL LOW (ref 22–32)
Calcium: 9 mg/dL (ref 8.9–10.3)
Chloride: 112 mmol/L — ABNORMAL HIGH (ref 98–111)
Creatinine, Ser: 0.64 mg/dL (ref 0.44–1.00)
GFR calc Af Amer: >60 mL/min (ref >60)
GFR calc non Af Amer: >60 mL/min (ref >60)
Glucose, Bld: 146 mg/dL — ABNORMAL HIGH (ref 70–99)
Potassium: 3.8 mmol/L (ref 3.5–5.1)
Sodium: 142 mmol/L (ref 135–145)

## 2019-07-04 LAB — CBC
HCT: 28.7 % — ABNORMAL LOW (ref 36.0–46.0)
Hemoglobin: 7.7 g/dL — ABNORMAL LOW (ref 12.0–15.0)
MCH: 19.9 pg — ABNORMAL LOW (ref 26.0–34.0)
MCHC: 26.8 g/dL — ABNORMAL LOW (ref 30.0–36.0)
MCV: 74.2 fL — ABNORMAL LOW (ref 80.0–100.0)
Platelets: 386 10*3/uL (ref 150–400)
RBC: 3.87 MIL/uL (ref 3.87–5.11)
RDW: 19.8 % — ABNORMAL HIGH (ref 11.5–15.5)
WBC: 7.1 10*3/uL (ref 4.0–10.5)
nRBC: 0 % (ref 0.0–0.2)

## 2019-07-04 LAB — GLUCOSE, CAPILLARY
Glucose-Capillary: 149 mg/dL — ABNORMAL HIGH (ref 70–99)
Glucose-Capillary: 133 mg/dL — ABNORMAL HIGH (ref 70–99)
Glucose-Capillary: 260 mg/dL — ABNORMAL HIGH (ref 70–99)
Glucose-Capillary: 256 mg/dL — ABNORMAL HIGH (ref 70–99)

## 2019-07-04 SURGERY — ESOPHAGOGASTRODUODENOSCOPY (EGD) WITH PROPOFOL
Anesthesia: Monitor Anesthesia Care

## 2019-07-04 MED ORDER — PROPOFOL 10 MG/ML IV BOLUS
INTRAVENOUS | Status: DC | PRN
  Administered 2019-07-04 (×4): 20 mg via INTRAVENOUS

## 2019-07-04 MED ORDER — PROPOFOL 1000 MG/100ML IV EMUL
INTRAVENOUS | Status: AC
  Filled 2019-07-04: qty 100

## 2019-07-04 MED ORDER — LACTATED RINGERS IV SOLN
INTRAVENOUS | Status: DC
  Administered 2019-07-04: 1000 mL via INTRAVENOUS

## 2019-07-04 MED ORDER — LIDOCAINE 2% (20 MG/ML) 5 ML SYRINGE
INTRAMUSCULAR | Status: DC | PRN
  Administered 2019-07-04: 80 mg via INTRAVENOUS

## 2019-07-04 MED ORDER — APIXABAN 5 MG PO TABS
10.0000 mg | ORAL_TABLET | Freq: Two times a day (BID) | ORAL | Status: DC
  Administered 2019-07-04 – 2019-07-05 (×3): 10 mg via ORAL
  Filled 2019-07-04 (×3): qty 2

## 2019-07-04 MED ORDER — LACTATED RINGERS IV SOLN: INTRAVENOUS | Status: DC | PRN

## 2019-07-04 MED ORDER — PROPOFOL 500 MG/50ML IV EMUL
INTRAVENOUS | Status: DC | PRN
  Administered 2019-07-04: 125 ug/kg/min via INTRAVENOUS

## 2019-07-04 MED ORDER — APIXABAN 5 MG PO TABS: 5.0000 mg | ORAL_TABLET | Freq: Two times a day (BID) | ORAL | Status: DC

## 2019-07-04 MED ORDER — SODIUM CHLORIDE 0.9 % IV SOLN: INTRAVENOUS | Status: DC

## 2019-07-04 MED ORDER — GADOBUTROL 1 MMOL/ML IV SOLN
7.5000 mL | Freq: Once | INTRAVENOUS | Status: AC | PRN
  Administered 2019-07-04: 7.5 mL via INTRAVENOUS

## 2019-07-04 SURGICAL SUPPLY — 25 items

## 2019-07-04 NOTE — Progress Notes (Addendum)
[facility] for IV heparin -> Eliquis  Indication: new DVT    No Known Allergies  Patient Measurements: Height: 5' 6"  (167.6 cm) Weight: 165 lb (74.8 kg) IBW/kg (Calculated) : 59.3 Heparin Dosing Weight: TBW (used [place] for dosing)  Vital Signs: Temp: 98.6 F (37 C) (02/12 0852) Temp Source: Oral (02/12 0852) BP: 177/86 (02/12 0900) Pulse Rate: 89 (02/12 0900)  Labs: Recent Labs    07/01/19 1641 07/01/19 1641 07/01/19 2350 07/01/19 2350 07/02/19 0147 07/02/19 0503 07/02/19 0918 07/02/19 2007 07/02/19 2007 07/03/19 0516 07/03/19 0819 07/04/19 0444  HGB 7.5*   < > 6.8*   < > 6.8*   < >  --  7.9*   < > 7.9*  --  7.7*  HCT 29.5*   < > 25.9*   < > 26.3*   < >  --  28.6*  --  29.3*  --  28.7*  PLT 420*   < > 392   < > 422*  --   --   --   --  379  --  386  LABPROT  --   --   --   --   --   --  14.2  --   --   --   --   --   INR  --   --   --   --   --   --  1.1  --   --   --   --   --   HEPARINUNFRC  --   --   --   --   --   --   --  0.35  --  0.35  --   --   CREATININE 0.83   < > 0.59  --  0.73  --   --   --   --   --  0.65  --   CKTOTAL 11*  --   --   --   --   --   --   --   --   --   --   --    < > = values in this interval not displayed.    Estimated Creatinine Clearance: 71.5 mL/min (by C-G formula based on SCr of 0.65 mg/dL).   Medical History: Past Medical History:  Diagnosis Date  . Anxiety and depression   . DJD (degenerative joint disease)   . GERD (gastroesophageal reflux disease)   . History of gastric ulcer    w/  bleeding-  05/ 2015  . History of kidney stones   . Hypertension 2011  . Migraines   . Nephrolithiasis    bilateral  . Osteoporosis   . Right ureteral stone   . Type 2 diabetes mellitus (HCC)     Medications:  Medications Prior to Admission  Medication Sig Dispense Refill Last Dose  . acetaminophen (TYLENOL) 500 MG tablet Take 500 mg by mouth every 6 (six) hours as needed for headache.    07/01/2019 at Unknown time  . ALPRAZolam (XANAX) 0.25 MG tablet Take 2 tablets (0.5 mg total) by mouth at bedtime as needed for anxiety. 60 tablet 0 06/30/2019 at Unknown time  . aspirin 81 MG tablet Take 81 mg by mouth daily.   06/30/2019 at Unknown time  . atorvastatin (LIPITOR) 10 MG tablet Take 1 tablet (10 mg total) by mouth daily. 30 tablet 6 06/30/2019 at Unknown time  . buPROPion (WELLBUTRIN XL) 150 MG 24 hr tablet Take 75  mg by mouth daily.    06/30/2019 at Unknown time  . diltiazem (CARTIA XT) 240 MG 24 hr capsule Take 1 capsule (240 mg total) by mouth daily. 90 capsule 1 06/30/2019 at Unknown time  . fenofibrate micronized (LOFIBRA) 200 MG capsule Take 1 capsule (200 mg total) by mouth daily before breakfast. 90 capsule 1 06/30/2019 at Unknown time  . glimepiride (AMARYL) 4 MG tablet Take 1.5 tablets (6 mg total) by mouth daily with breakfast. 45 tablet 0 06/30/2019 at Unknown time  . ibuprofen (ADVIL) 200 MG tablet Take 200 mg by mouth every 6 (six) hours as needed for headache or moderate pain.   07/01/2019 at Unknown time  . losartan (COZAAR) 50 MG tablet Take 25 mg by mouth daily.    06/30/2019 at Unknown time  . metFORMIN (GLUCOPHAGE) 1000 MG tablet Take 1 tablet (1,000 mg total) by mouth 2 (two) times daily with a meal. 60 tablet 0 06/30/2019 at Unknown time  . PARoxetine (PAXIL) 40 MG tablet Take 1 tablet (40 mg total) by mouth daily. 90 tablet 1 06/30/2019 at Unknown time  . cyclobenzaprine (FLEXERIL) 10 MG tablet Take 1 tablet (10 mg total) by mouth at bedtime as needed for muscle spasms. (Patient not taking: Reported on 07/01/2019) 21 tablet 0 Not Taking at Unknown time  . sitaGLIPtin (JANUVIA) 100 MG tablet Take 1 tablet (100 mg total) by mouth daily. (Patient not taking: Reported on 07/01/2019) 30 tablet 6 Not Taking at Unknown time  . zolmitriptan (ZOMIG) 5 MG tablet Take 1 tablet (5 mg total) by mouth as needed for migraine. (Patient not taking: Reported on 08/17/2015) 10 tablet 3 Not Taking at Unknown  time   Scheduled:  . sodium chloride   Intravenous Once  . apixaban  10 mg Oral BID   Followed by  . [START ON 07/11/2019] apixaban  5 mg Oral BID  . atorvastatin  10 mg Oral Daily  . diltiazem  240 mg Oral Daily  . fenofibrate  54 mg Oral Daily  . insulin aspart  0-5 Units Subcutaneous QHS  . insulin aspart  0-6 Units Subcutaneous TID WC  . losartan  25 mg Oral Daily  . magnesium oxide  400 mg Oral BID  . PARoxetine  40 mg Oral Daily  . peg 3350 powder  0.5 kit Oral Once   Infusions:  . sodium chloride 75 mL/hr at 07/04/19 0507   Assessment: 54 yoF with PMH DM2, HTN, HLD, bleeding gastric ulcer (2015), DJD, anxiety/depression, nephrolithiasis, sciatica, admitted 2/9 for encephalopathy and RLE pain. Found to have R calf DVT, but hemoglobin acutely down from baseline. Hx bleeding gastric ulcers, but FOBT currently negative. MRI brain negative for CVA. Pharmacy to dose IV heparin per GI recommendations.   Baseline INR WNL; aPTT not done  Prior anticoagulation: none, ASA 81 mg only  Significant events:  07/04/2019 - s/p endoscopy Cleared by GI to re-start anticoagulation    Today, 07/04/2019:  Heparin off since 4:05 am   CBC: Hgb low at 7.7 s/p 1 unit PRBC; Plt WNL  No bleeding or infusion issues per nursing  SCr stable WNL  Goal of Therapy: Heparin level 0.3-0.7 units/ml Monitor platelets by anticoagulation protocol: Yes  Plan:  Discussed with MD, will hold ASA while on heparin given no hx CAD/CVA  D/C Heparin and Heparin labs   Start Eliquis 9m PO BID for 7 days, then change to 59mBID PO.   Monitor for signs of bleeding or thrombosis  Educate  the patient on Eliquis and provide coupon.   Noora Hashim, Student-PharmD 07/04/2019@10 :35 AM  Agree with assessment & plan as outlined above. Netta Cedars, PharmD, BCPS 07/04/2019@10 :51 AM

## 2019-07-04 NOTE — Progress Notes (Addendum)
Patient received Moviprep at 1755. At [place] patient has only consumed 1 cup total since administration. RN educated pt and daughter about the importance of solution and timing. Daughter stated around 2000 that pt shakes head when asks to drink. Daughter stated she is unsure if pt will consume all of drink. RN constantly reminds pt perdiocally that it must be completed in a certain time however pt only takes 1 sip at a time and says its nasty. Pt has had only 1 semi -soft/ liquid brown BM since consumption at 1755. Will notify on call and continue to monitor.

## 2019-07-04 NOTE — Progress Notes (Signed)
PROGRESS NOTE    Julie [name]  WUJ:[id number] DOB: October 31, 1950 DOA: 07/01/2019 PCP: Levy Pupa, MD    Brief Narrative:  69 year old lady with prior history of type 2 diabetes mellitus, hypertension, gastric ulcer from NSAID use s/p EGD in 2015, anxiety, depression presents to ED on 9 February with right lower extremity pain and confusion.  She underwent CT of the head and MRI of the brain which were unremarkable.  Right lower extremity duplex showed DVT.  On arrival to ED she was found to be anemic with a hemoglobin of 7.5 whereas his baseline is around 11 3 years ago.  Her hemoglobin dropped to 6.8 and 1 unit of PRBC transfusion ordered.  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 It has gradually enlarged since 2013 and is unlikely malignant. 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 MRI abdomen without and with contrast is recommended for further evaluation. GI consulted for anemia.  She was started on IV heparin for the right lower extremity DVT.  IR consulted to see if she needs an IVC filter. No rectal bleeding noticed.    Assessment & Plan:   Active Problems:   Acute encephalopathy   Duodenal ulcer   Gastroesophageal reflux disease with esophagitis without hemorrhage  Acute metabolic encephalopathy Probably secondary to hypernatremia, dehydration and medications. Patient is more alert today and is able to answer all questions, though she has significant short-term memory deficits. Initial CT of the head and MRI are negative for acute intracranial abnormalities. Patient will need outpatient follow-up with neurology for evaluation of dementia.   Acute hypertensive crisis Blood pressure parameters suboptimally controlled.  Patient is on Cardizem and Cozaar hydralazine was added..    Right lower extremity pain secondary to acute DVT She is started on IV heparin,  transition to Eliquis today No obvious bleeding seen today.  Hemoglobin stable around 7.   Iron deficiency anemia/microcytic anemia differential include acute anemia of blood loss versus anemia of chronic disease Baseline hemoglobin around 11.  In view of her history of gastric ulcer GI consulted , underwent EGD showing esophagitis, gastric erosions and superficial duodenal ulcers without any bleeding.  GI recommends Protonix 40 mg twice daily for a month followed by 40 mg daily indefinitely and to avoid NSAID use. 1 unit of PRBC transfusion ordered.  Hemoglobin stable at 7.7. Transfuse to keep hemoglobin greater than 7.  Anemia panel reviewed showed low ferritin and iron levels.  Patient received IV iron infusion Recommend outpatient follow-up with gastroenterology for colonoscopy.  Hypomagnesemia Replaced.   An episode of SVT Probably secondary to hypomagnesemia Keep potassium greater than 4 and magnesium greater than 2.   Thrombocytosis Probably secondary to iron deficiency anemia. Resolved    Diabetes mellitus well controlled CBG'S/  A1c s 9.6  CBG (last 3)  Recent Labs    07/03/19 2132 07/04/19 0802 07/04/19 1214  GLUCAP 140* 149* 133*  Continue with sliding scale insulin.  No changes in medications.    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  Further evaluation with an MRI of the abdomen with and without contrast ordered.    DVT prophylaxis: Eliquis Code Status: Full code.  Family Communication: discussed with daughter ON THE PHONE.  Disposition Plan:  . Patient came from: Home.            Marland Kitchen  Anticipated d/c place: Home Barriers to d/c OR conditions which need to be met to effect a safe d/c: pt not stable for discharge, .  Will discharge tomorrow  Consultants:   Gastroenterology.    Procedures: egd and colonoscopy scheduled for tomorrow.     Antimicrobials: none.      Subjective: Patient denies any chest pain, shortness of breath, nausea, vomiting or abdominal pain or diarrhea.  Objective: Vitals:   07/04/19 0852 07/04/19 0855 07/04/19 0900 07/04/19 1431  BP: (!) 165/87 (!) 174/96 (!) 177/86 (!) 153/71  Pulse: 89 90 89 91  Resp: (!) 21 15 15 20   Temp: 98.6 F (37 C)   (!) 97.4 F (36.3 C)  TempSrc: Oral   Axillary  SpO2: 99% 99% 97% 96%  Weight:      Height:        Intake/Output Summary (Last 24 hours) at 07/04/2019 1452 Last data filed at 07/04/2019 0847 Gross per 24 hour  Intake 600 ml  Output --  Net 600 ml   Filed Weights   07/01/19 1555  Weight: 74.8 kg    Examination:  General exam: Alert and comfortable Respiratory system: Clear to auscultation bilaterally, no wheezing or rhonchi Cardiovascular system: S1-S2 heard, regular rate rhythm, no JVD, no pedal edema. Gastrointestinal system: Abdomen is soft, nontender, nondistended, bowel sounds within normal limits. Central nervous system: Alert, oriented to place person, able to move all extremities Extremities: No pedal edema Skin: No rashes Psychiatry: mood is  appropriate    Data Reviewed: I have personally reviewed following labs and imaging studies  CBC: Recent Labs  Lab 07/01/19 1641 07/01/19 1641 07/01/19 2350 07/01/19 2350 07/02/19 0147 07/02/19 0503 07/02/19 2007 07/03/19 0516 07/04/19 0444  WBC 7.6  --  7.6  --  8.5  --   --  9.1 7.1  NEUTROABS 5.0  --   --   --   --   --   --   --   --   HGB 7.5*   < > 6.8*   < > 6.8* 6.9* 7.9* 7.9* 7.7*  HCT 29.5*   < > 25.9*   < > 26.3* 27.3* 28.6* 29.3* 28.7*  MCV 72.5*  --  72.5*  --  72.7*  --   --  74.6* 74.2*  PLT 420*  --  392  --  422*  --   --  379 386   < > = values in this interval not displayed.   Basic Metabolic Panel: Recent Labs  Lab 07/01/19 1641 07/01/19 2350 07/02/19 0147 07/03/19 0819 07/04/19 0948  NA 147*  --  145 139 142  K 3.9  --  3.8 3.4* 3.8  CL 118*  --  119* 108 112*  CO2 21*   --  19* 20* 20*  GLUCOSE 136*  --  162* 148* 146*  BUN 14  --  11 6* <5*  CREATININE 0.83 0.59 0.73 0.65 0.64  CALCIUM 9.9  --  8.9 9.2 9.0  MG  --  1.4*  --  1.7  --   PHOS  --  2.6  --   --   --    GFR: Estimated Creatinine Clearance: 71.5 mL/min (by C-G formula based on SCr of 0.64 mg/dL). Liver Function Tests: Recent Labs  Lab 07/01/19 1641  AST 20  ALT 26  ALKPHOS 51  BILITOT 0.8  PROT 7.2  ALBUMIN 3.8   No results for input(s): LIPASE, AMYLASE in the last 168 hours. No  results for input(s): AMMONIA in the last 168 hours. Coagulation Profile: Recent Labs  Lab 07/02/19 0918  INR 1.1   Cardiac Enzymes: Recent Labs  Lab 07/01/19 1641  CKTOTAL 11*   BNP (last 3 results) No results for input(s): PROBNP in the last 8760 hours. HbA1C: Recent Labs    07/02/19 0147  HGBA1C 9.6*   CBG: Recent Labs  Lab 07/03/19 1138 07/03/19 1641 07/03/19 2132 07/04/19 0802 07/04/19 1214  GLUCAP 138* 180* 140* 149* 133*   Lipid Profile: No results for input(s): CHOL, HDL, LDLCALC, TRIG, CHOLHDL, LDLDIRECT in the last 72 hours. Thyroid Function Tests: Recent Labs    07/02/19 0147  TSH 3.420   Anemia Panel: Recent Labs    07/01/19 1641 07/02/19 0147 07/02/19 0503  VITAMINB12 1,254* 1,152*  --   FOLATE 9.8 9.9  --   FERRITIN 9* 10*  --   TIBC 616* 560*  --   IRON 20* 24*  --   RETICCTPCT 1.6  --  1.5   Sepsis Labs: No results for input(s): PROCALCITON, LATICACIDVEN in the last 168 hours.  Recent Results (from the past 240 hour(s))  Respiratory Panel by RT PCR (Flu A&B, Covid) - Nasopharyngeal Swab     Status: None   Collection Time: 07/01/19  5:00 PM   Specimen: Nasopharyngeal Swab  Result Value Ref Range Status   SARS Coronavirus 2 by RT PCR NEGATIVE NEGATIVE Final    Comment: (NOTE) SARS-CoV-2 target nucleic acids are NOT DETECTED. The SARS-CoV-2 RNA is generally detectable in upper respiratoy specimens during the acute phase of infection. The  lowest concentration of SARS-CoV-2 viral copies this assay can detect is 131 copies/mL. A negative result does not preclude SARS-Cov-2 infection and should not be used as the sole basis for treatment or other patient management decisions. A negative result may occur with  improper specimen collection/handling, submission of specimen other than nasopharyngeal swab, presence of viral mutation(s) within the areas targeted by this assay, and inadequate number of viral copies (<131 copies/mL). A negative result must be combined with clinical observations, patient history, and epidemiological information. The expected result is Negative. Fact Sheet for Patients:  PinkCheek.be Fact Sheet for Healthcare Providers:  GravelBags.it This test is not yet ap proved or cleared by the [place] FDA and  has been authorized for detection and/or diagnosis of SARS-CoV-2 by FDA under an Emergency Use Authorization (EUA). This EUA will remain  in effect (meaning this test can be used) for the duration of the COVID-19 declaration under Section 564(b)(1) of the Act, 21 U.S.C. section 360bbb-3(b)(1), unless the authorization is terminated or revoked sooner.    Influenza A by PCR NEGATIVE NEGATIVE Final   Influenza B by PCR NEGATIVE NEGATIVE Final    Comment: (NOTE) The Xpert Xpress SARS-CoV-2/FLU/RSV assay is intended as an aid in  the diagnosis of influenza from Nasopharyngeal swab specimens and  should not be used as a sole basis for treatment. Nasal washings and  aspirates are unacceptable for Xpert Xpress SARS-CoV-2/FLU/RSV  testing. Fact Sheet for Patients: PinkCheek.be Fact Sheet for Healthcare Providers: GravelBags.it This test is not yet approved or cleared by the [place] FDA and  has been authorized for detection and/or diagnosis of SARS-CoV-2 by  FDA under an Emergency  Use Authorization (EUA). This EUA will remain  in effect (meaning this test can be used) for the duration of the  Covid-19 declaration under Section 564(b)(1) of the Act, 21  U.S.C. section 360bbb-3(b)(1), unless the authorization is  terminated or revoked. Performed at [facility], [place] [address], [place], [place] [address]          Radiology Studies: DG Lumbar Spine 2-3 Views  Result Date: 07/03/2019 CLINICAL DATA:  Acute low back pain. EXAM: LUMBAR SPINE - 2-3 VIEW COMPARISON:  Lumbar MRI, 10/19/2011.  CT, 07/01/2019. FINDINGS: Mild dextroscoliosis. No fracture. No bone lesion. Grade 1-2 anterolisthesis L5 on S1, stable from the recent CT. No other spondylolisthesis. Mild to moderate loss of disc height at L3-L4 and L4-L5. Moderate loss of disc height at L5-S1. Moderate loss of disc height at L1-L2. Bilateral intrarenal stones stable from the recent CT. IMPRESSION: 1. No fracture or acute finding. 2. Degenerative changes including a grade 1-2 anterolisthesis of L5 on S1, degenerative in origin. Mild dextroscoliosis. Findings are stable from the recent CT. Disc degenerative changes at L3-L4, L4-L5 and L1-L2 have progressed since an MRI of the lumbar spine dated 10/19/2011. Electronically Signed   By: Lajean Manes M.D.   On: 07/03/2019 13:07        Scheduled Meds: . sodium chloride   Intravenous Once  . apixaban  10 mg Oral BID   Followed by  . [START ON 07/11/2019] apixaban  5 mg Oral BID  . atorvastatin  10 mg Oral Daily  . diltiazem  240 mg Oral Daily  . fenofibrate  54 mg Oral Daily  . insulin aspart  0-5 Units Subcutaneous QHS  . insulin aspart  0-6 Units Subcutaneous TID WC  . losartan  25 mg Oral Daily  . magnesium oxide  400 mg Oral BID  . PARoxetine  40 mg Oral Daily  . peg 3350 powder  0.5 kit Oral Once   Continuous Infusions: . sodium chloride 75 mL/hr at 07/04/19 0507     LOS: 2 days        Hosie Poisson, MD [facility]   To  contact the attending provider between 7A-7P or the covering provider during after hours 7P-7A, please log into the web site [email or web address] and access using universal [place] password for that web site. If you do not have the password, please call the hospital operator.  07/04/2019, 2:52 PM

## 2019-07-04 NOTE — Care Management Important Message (Signed)
Important Message  Patient Details IM Letter given to Roque Lias SW Case Manager to present to the Patient Name: Julie [name] MRN: [id number] Date of Birth: 06/27/1950   Medicare Important Message Given:  Yes     Kerin Salen 07/04/2019, 11:25 AM

## 2019-07-04 NOTE — Interval H&P Note (Signed)
History and Physical Interval Note:  07/04/2019 8:26 AM  Julie [name]  has presented today for surgery, with the diagnosis of Anemia.  The various methods of treatment have been discussed with the patient and family. After consideration of risks, benefits and other options for treatment, the patient has consented to  Procedure(s): ESOPHAGOGASTRODUODENOSCOPY (EGD) WITH PROPOFOL (N/A) COLONOSCOPY WITH PROPOFOL (N/A) as a surgical intervention.  The patient's history has been reviewed, patient examined, no change in status, stable for surgery.  I have reviewed the patient's chart and labs.  Questions were answered to the patient's satisfaction.     Julie [name]

## 2019-07-04 NOTE — Progress Notes (Signed)
GI ATTENDING  Upper endoscopy completed.  Please see results and final recommendations. Okay to resume anticoagulation, from GI standpoint. We will sign off.  Docia Chuck. Geri [place]in[place]., M.D. [facility] Division of Gastroenterology

## 2019-07-04 NOTE — Anesthesia Preprocedure Evaluation (Signed)
Anesthesia Evaluation  Patient identified by MRN, date of birth, ID band Patient awake    Reviewed: Allergy & Precautions, NPO status , Patient's Chart, lab work & pertinent test results  Airway Mallampati: II  TM Distance: >3 FB     Dental  (+) Dental Advisory Given   Pulmonary former smoker,    breath sounds clear to auscultation       Cardiovascular hypertension, Pt. on medications + DVT   Rhythm:Regular Rate:Normal     Neuro/Psych  Headaches, Anxiety Depression    GI/Hepatic Neg liver ROS, PUD, GERD  ,GI bleed   Endo/Other  diabetes, Oral Hypoglycemic Agents  Renal/GU negative Renal ROS     Musculoskeletal  (+) Arthritis ,   Abdominal   Peds  Hematology  (+) anemia ,   Anesthesia Other Findings   Reproductive/Obstetrics                             Anesthesia Physical Anesthesia Plan  ASA: III  Anesthesia Plan: MAC   Post-op Pain Management:    Induction:   PONV Risk Score and Plan: 2 and Propofol infusion, Ondansetron and Treatment may vary due to age or medical condition  Airway Management Planned: Natural Airway and Nasal Cannula  Additional Equipment:   Intra-op Plan:   Post-operative Plan:   Informed Consent: I have reviewed the patients History and Physical, chart, labs and discussed the procedure including the risks, benefits and alternatives for the proposed anesthesia with the patient or authorized representative who has indicated his/her understanding and acceptance.       Plan Discussed with: CRNA  Anesthesia Plan Comments:         Anesthesia Quick Evaluation

## 2019-07-04 NOTE — Anesthesia Postprocedure Evaluation (Signed)
Anesthesia Post Note  Patient: Julie [name]  Procedure(s) Performed: ESOPHAGOGASTRODUODENOSCOPY (EGD) WITH PROPOFOL (N/A )     Patient location during evaluation: PACU Anesthesia Type: MAC Level of consciousness: awake and alert Pain management: pain level controlled Vital Signs Assessment: post-procedure vital signs reviewed and stable Respiratory status: spontaneous breathing, nonlabored ventilation, respiratory function stable and patient connected to nasal cannula oxygen Cardiovascular status: stable and blood pressure returned to baseline Postop Assessment: no apparent nausea or vomiting Anesthetic complications: no    Last Vitals:  Vitals:   07/04/19 0855 07/04/19 0900  BP: (!) 174/96 (!) 177/86  Pulse: 90 89  Resp: 15 15  Temp:    SpO2: 99% 97%    Last Pain:  Vitals:   07/04/19 0945  TempSrc:   PainSc: 0-No pain                 Tiajuana Amass

## 2019-07-04 NOTE — Op Note (Addendum)
[facility] Patient Name: Julie [name] Procedure Date: 07/04/2019 MRN: [id number] Attending MD: Docia Chuck. Henrene Pastor , MD Date of Birth: 1950/07/06 CSN: [id number] Age: 69 Admit Type: Inpatient Procedure:                Upper GI endoscopy with biopsies Indications:              Iron deficiency anemia Providers:                Docia Chuck. Henrene Pastor, MD, Cleda Daub, RN, Lina Sar, Technician, Caryl Pina CRNA Referring MD:             [facility] Medicines:                Monitored Anesthesia Care Complications:            No immediate complications. Estimated Blood Loss:     Estimated blood loss: none. Procedure:                Pre-Anesthesia Assessment:                           - Prior to the procedure, a History and Physical                            was performed, and patient medications and                            allergies were reviewed. The patient's tolerance of                            previous anesthesia was also reviewed. The risks                            and benefits of the procedure and the sedation                            options and risks were discussed with the patient.                            All questions were answered, and informed consent                            was obtained. Prior Anticoagulants: The patient has                            taken heparin, last dose was 1 day prior to                            procedure. ASA Grade Assessment: III - A patient                            with severe systemic disease. After reviewing the  risks and benefits, the patient was deemed in                            satisfactory condition to undergo the procedure.                           After obtaining informed consent, the endoscope was                            passed under direct vision. Throughout the                            procedure, the patient's blood pressure, pulse, and                          oxygen saturations were monitored continuously. The                            [id number] Olympus gastroscope was                            introduced through the mouth, and advanced to the                            second part of duodenum. The upper GI endoscopy was                            accomplished without difficulty. The patient                            tolerated the procedure well. Scope In: Scope Out: Findings:      The esophagus revealed mild distal esophagitis and partial stricture       formation.      The stomach revealed multiple linear erosions and deformity of the       pyloric channel consistent with prior ulcer disease. Biopsies were taken       with a cold forceps for Helicobacter pylori testing using CLOtest.      The examined duodenum revealed several superficial ulcers.      The cardia and gastric fundus were normal on retroflexion, save small       hiatal hernia. Impression:               1. Reflux esophagitis                           2. Gastric erosions                           3. Superficial duodenal ulcers status post CLO                            biopsy                           4. Iron deficiency anemia possibly secondary to  upper GI findings                           5. Patient was unable to complete her colonoscopy                            prep                           6. Hemoccult negative stool                           7. No large bowel abnormalities on CT Moderate Sedation:      none Recommendation:           1. Avoid unnecessary NSAIDs                           2. Please discharge on pantoprazole 40 mg twice                            daily for 1 month then 40 mg once daily indefinitely                           3. Iron sulfate 325 mg twice daily. Please                            discharge patient on this entity                           4. Outpatient GI follow-up with Dr.  Ardis Hughs or                            advanced practitioner in about 4 weeks (we will                            arrange). It would be ideal to complete                            colonoscopy. It will be discussed again at that                            time. Patient aware                           Resume previous diet. GI will sign off. Thank you. Procedure Code(s):        --- Professional ---                           [phone], Esophagogastroduodenoscopy, flexible,                            transoral; with biopsy, single or multiple Diagnosis Code(s):        --- Professional ---  K21.00, Gastro-esophageal reflux disease with                            esophagitis, without bleeding                           D50.9, Iron deficiency anemia, unspecified CPT copyright 2019 American Medical Association. All rights reserved. The codes documented in this report are preliminary and upon coder review may  be revised to meet current compliance requirements. Docia Chuck. Henrene Pastor, MD 07/04/2019 9:03:35 AM This report has been signed electronically. Number of Addenda: 0

## 2019-07-04 NOTE — Transfer of Care (Signed)
Immediate Anesthesia Transfer of Care Note  Patient: Julie [name]  Procedure(s) Performed: ESOPHAGOGASTRODUODENOSCOPY (EGD) WITH PROPOFOL (N/A )  Patient Location: Endoscopy Unit  Anesthesia Type:MAC  Level of Consciousness: awake, alert , oriented and patient cooperative  Airway & Oxygen Therapy: Patient Spontanous Breathing and Patient connected to face mask oxygen  Post-op Assessment: Report given to RN, Post -op Vital signs reviewed and stable and Patient moving all extremities  Post vital signs: Reviewed and stable  Last Vitals:  Vitals Value Taken Time  BP    Temp    Pulse    Resp    SpO2      Last Pain:  Vitals:   07/04/19 0751  TempSrc: Oral  PainSc: 5       Patients Stated Pain Goal: 2 (61/44/31 5400)  Complications: No apparent anesthesia complications

## 2019-07-05 DIAGNOSIS — K2100 Gastro-esophageal reflux disease with esophagitis, without bleeding: Secondary | ICD-10-CM

## 2019-07-05 LAB — CBC
HCT: 28.4 % — ABNORMAL LOW (ref 36.0–46.0)
Hemoglobin: 7.6 g/dL — ABNORMAL LOW (ref 12.0–15.0)
MCH: 20.1 pg — ABNORMAL LOW (ref 26.0–34.0)
MCHC: 26.8 g/dL — ABNORMAL LOW (ref 30.0–36.0)
MCV: 75.1 fL — ABNORMAL LOW (ref 80.0–100.0)
Platelets: 380 10*3/uL (ref 150–400)
RBC: 3.78 MIL/uL — ABNORMAL LOW (ref 3.87–5.11)
RDW: 20.7 % — ABNORMAL HIGH (ref 11.5–15.5)
WBC: 7.9 10*3/uL (ref 4.0–10.5)
nRBC: 0 % (ref 0.0–0.2)

## 2019-07-05 LAB — GLUCOSE, CAPILLARY
Glucose-Capillary: 169 mg/dL — ABNORMAL HIGH (ref 70–99)
Glucose-Capillary: 186 mg/dL — ABNORMAL HIGH (ref 70–99)

## 2019-07-05 LAB — CLOTEST (H. PYLORI), BIOPSY: Helicobacter screen: NEGATIVE

## 2019-07-05 MED ORDER — PANTOPRAZOLE SODIUM 40 MG PO TBEC: DELAYED_RELEASE_TABLET | ORAL | 1 refills | Status: AC

## 2019-07-05 MED ORDER — PANTOPRAZOLE SODIUM 40 MG PO TBEC: 40.0000 mg | DELAYED_RELEASE_TABLET | Freq: Two times a day (BID) | ORAL | Status: DC

## 2019-07-05 MED ORDER — FERROUS SULFATE 325 (65 FE) MG PO TABS: 325.0000 mg | ORAL_TABLET | Freq: Two times a day (BID) | ORAL | Status: DC

## 2019-07-05 MED ORDER — APIXABAN 5 MG PO TABS: 5.0000 mg | ORAL_TABLET | Freq: Two times a day (BID) | ORAL | 1 refills | Status: DC

## 2019-07-05 MED ORDER — MAGNESIUM OXIDE 400 (241.3 MG) MG PO TABS: 400.0000 mg | ORAL_TABLET | Freq: Two times a day (BID) | ORAL | 0 refills | Status: AC

## 2019-07-05 MED ORDER — FERROUS SULFATE 325 (65 FE) MG PO TABS: 325.0000 mg | ORAL_TABLET | Freq: Two times a day (BID) | ORAL | 3 refills | Status: AC

## 2019-07-05 MED ORDER — OXYCODONE-ACETAMINOPHEN 5-325 MG PO TABS: 1.0000 | ORAL_TABLET | Freq: Four times a day (QID) | ORAL | 0 refills | Status: AC | PRN

## 2019-07-05 MED ORDER — APIXABAN 5 MG PO TABS: 10.0000 mg | ORAL_TABLET | Freq: Two times a day (BID) | ORAL | 0 refills | Status: DC

## 2019-07-05 NOTE — Discharge Summary (Signed)
Physician Discharge Summary  Julie [name] [id number] DOB: [birth year] DOA: 07/01/2019  PCP: Levy Pupa, MD  Admit date: 07/01/2019 Discharge date: 07/05/2019  Admitted From: Home Disposition:  Home   Recommendations for Outpatient Follow-up:  1. Follow up with PCP in 1-2 weeks 2. Please obtain BMP/CBC in one week Please follow up with gastroenterology in one week.    Discharge Condition: stable.  CODE STATUS: full code.  Diet recommendation: Heart Healthy   Brief/Interim Summary: 69 year old lady with prior history of type 2 diabetes mellitus, hypertension, gastric ulcer from NSAID use s/p EGD in 2015, anxiety, depression presents to ED on 9 February with right lower extremity pain and confusion.  She underwent CT of the head and MRI of the brain which were unremarkable.  Right lower extremity duplex showed DVT.  On arrival to ED she was found to be anemic with a hemoglobin of 7.5 whereas his baseline is around 11 3 years ago.  Her hemoglobin dropped to 6.8 and 1 unit of PRBC transfusion ordered.  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 It has gradually enlarged since 2013 and is unlikely malignant. 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 MRI abdomen without and with contrast is recommended for further evaluation. GI consulted for anemia.  She was started on IV heparin for the right lower extremity DVT.  IR consulted to see if she needs an IVC filter. No rectal bleeding noticed.   Discharge Diagnoses:  Active Problems:   Acute encephalopathy   Duodenal ulcer   Gastroesophageal reflux disease with esophagitis without hemorrhage  Acute metabolic encephalopathy Probably secondary to hypernatremia, dehydration and medications. Patient is more alert today and is able to answer all questions, though she has significant short-term memory deficits. Initial CT of the head  and MRI are negative for acute intracranial abnormalities. Patient will need outpatient follow-up with neurology for evaluation of dementia.   Acute hypertensive crisis Blood pressure parameters suboptimally controlled.  Patient is on Cardizem and Cozaar .     Right lower extremity pain secondary to acute DVT She is started on IV heparin, transition to Eliquis today No obvious bleeding seen today.  Hemoglobin stable around 7.   Iron deficiency anemia/microcytic anemia differential include acute anemia of blood loss versus anemia of chronic disease Baseline hemoglobin around 11.  In view of her history of gastric ulcer GI consulted , underwent EGD showing esophagitis, gastric erosions and superficial duodenal ulcers without any bleeding.  GI recommends Protonix 40 mg twice daily for a month followed by 40 mg daily indefinitely and to avoid NSAID use. 1 unit of PRBC transfusion ordered.  Hemoglobin stable at 7.7. Transfuse to keep hemoglobin greater than 7.  Anemia panel reviewed showed low ferritin and iron levels.  Patient received IV iron infusion Recommend outpatient follow-up with gastroenterology for colonoscopy.  Hypomagnesemia Replaced.   An episode of SVT Probably secondary to hypomagnesemia Keep potassium greater than 4 and magnesium greater than 2.   Thrombocytosis Probably secondary to iron deficiency anemia. Resolved    Diabetes mellitus well controlled CBG'S/  A1c s 9.6  Resume home meds.   No changes in medications.    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  Further evaluation with an MRI of the abdomen with and without contrast ordered.  Discharge Instructions  Discharge Instructions    Diet - low sodium heart healthy   Complete by: As directed    Discharge instructions   Complete by: As directed    Please follow up with PCP in one  week.  Please check for MRI of the abdomen with and without contrast in one year to follow up the abnormal mass .  Please follow up with gastroenterology as recommended.     Allergies as of 07/05/2019   No Known Allergies     Medication List    STOP taking these medications   aspirin 81 MG tablet   cyclobenzaprine 10 MG tablet Commonly known as: FLEXERIL   ibuprofen 200 MG tablet Commonly known as: ADVIL   sitaGLIPtin 100 MG tablet Commonly known as: Januvia   zolmitriptan 5 MG tablet Commonly known as: ZOMIG     TAKE these medications   acetaminophen 500 MG tablet Commonly known as: TYLENOL Take 500 mg by mouth every 6 (six) hours as needed for headache.   ALPRAZolam 0.25 MG tablet Commonly known as: XANAX Take 2 tablets (0.5 mg total) by mouth at bedtime as needed for anxiety.   apixaban 5 MG Tabs tablet Commonly known as: ELIQUIS Take 2 tablets (10 mg total) by mouth 2 (two) times daily for 5 days.   apixaban 5 MG Tabs tablet Commonly known as: ELIQUIS Take 1 tablet (5 mg total) by mouth 2 (two) times daily. Start taking on: July 11, 2019   atorvastatin 10 MG tablet Commonly known as: LIPITOR Take 1 tablet (10 mg total) by mouth daily.   buPROPion 150 MG 24 hr tablet Commonly known as: WELLBUTRIN XL Take 75 mg by mouth daily.   diltiazem 240 MG 24 hr capsule Commonly known as: Cartia XT Take 1 capsule (240 mg total) by mouth daily.   fenofibrate micronized 200 MG capsule Commonly known as: LOFIBRA Take 1 capsule (200 mg total) by mouth daily before breakfast.   ferrous sulfate 325 (65 FE) MG tablet Take 1 tablet (325 mg total) by mouth 2 (two) times daily with a meal.   glimepiride 4 MG tablet Commonly known as: AMARYL Take 1.5 tablets (6 mg total) by mouth daily with breakfast.   losartan 50 MG tablet Commonly known as: COZAAR Take 25 mg by mouth daily.   magnesium oxide 400 (241.3 Mg) MG tablet Commonly known as: MAG-OX Take 1 tablet  (400 mg total) by mouth 2 (two) times daily.   metFORMIN 1000 MG tablet Commonly known as: GLUCOPHAGE Take 1 tablet (1,000 mg total) by mouth 2 (two) times daily with a meal.   pantoprazole 40 MG tablet Commonly known as: PROTONIX 40 mg twice daily for one month followed by 40 mg daily indefinitely.   PARoxetine 40 MG tablet Commonly known as: PAXIL Take 1 tablet (40 mg total) by mouth daily.      Follow-up Information    Zehr, Laban Emperor, PA-C Follow up on 08/05/2019.   Specialty: Gastroenterology Why: 9:00 am Contact information: [place] [address] [phone]        Levy Pupa, MD. Schedule an appointment as soon as possible for a visit in 1 week(s).   Specialty: Family Medicine Contact information: [address] [place] [address] [phone]          No Known Allergies  Consultations:  GI  IR   Procedures/Studies: DG Lumbar Spine 2-3 Views  Result Date: 07/03/2019 CLINICAL DATA:  Acute low back pain. EXAM:  LUMBAR SPINE - 2-3 VIEW COMPARISON:  Lumbar MRI, 10/19/2011.  CT, 07/01/2019. FINDINGS: Mild dextroscoliosis. No fracture. No bone lesion. Grade 1-2 anterolisthesis L5 on S1, stable from the recent CT. No other spondylolisthesis. Mild to moderate loss of disc height at L3-L4 and L4-L5. Moderate loss of disc height at L5-S1. Moderate loss of disc height at L1-L2. Bilateral intrarenal stones stable from the recent CT. IMPRESSION: 1. No fracture or acute finding. 2. Degenerative changes including a grade 1-2 anterolisthesis of L5 on S1, degenerative in origin. Mild dextroscoliosis. Findings are stable from the recent CT. Disc degenerative changes at L3-L4, L4-L5 and L1-L2 have progressed since an MRI of the lumbar spine dated 10/19/2011. Electronically Signed   By: Lajean Manes M.D.   On: 07/03/2019 13:07   DG Tibia/Fibula Right  Result Date: 07/01/2019 CLINICAL DATA:  69 year old female with abrasion on the proximal tibia. EXAM:  RIGHT TIBIA AND FIBULA - 2 VIEW COMPARISON:  None. FINDINGS: There is no acute fracture or dislocation. The bones are osteopenic. No significant arthritic changes. The soft tissues are unremarkable. IMPRESSION: Negative. Electronically Signed   By: Anner Crete M.D.   On: 07/01/2019 17:51   CT Head Wo Contrast  Result Date: 07/01/2019 CLINICAL DATA:  Encephalopathy, lethargy, altered level of consciousness, somnolence EXAM: CT HEAD WITHOUT CONTRAST TECHNIQUE: Contiguous axial images were obtained from the base of the skull through the vertex without intravenous contrast. COMPARISON:  None. FINDINGS: Brain: Hypodensities in the bilateral frontal periventricular white matter and bilateral basal ganglia consistent with age-indeterminate small vessel ischemic change. No other signs of acute infarct or hemorrhage. Lateral ventricles and remaining midline structures are unremarkable. No acute extra-axial fluid collections. No mass effect. Vascular: No hyperdense vessel or unexpected calcification. Skull: Normal. Negative for fracture or focal lesion. Sinuses/Orbits: No acute finding. Other: None IMPRESSION: 1. Age-indeterminate small vessel ischemic changes within the bilateral frontal periventricular white matter and basal ganglia, favor chronic. 2. Otherwise unremarkable exam. Electronically Signed   By: Randa Ngo M.D.   On: 07/01/2019 18:49   MR BRAIN WO CONTRAST  Result Date: 07/01/2019 CLINICAL DATA:  Encephalopathy EXAM: MRI HEAD WITHOUT CONTRAST TECHNIQUE: Multiplanar, multiecho pulse sequences of the brain and surrounding structures were obtained without intravenous contrast. COMPARISON:  None. FINDINGS: Brain: No acute infarct, acute hemorrhage or extra-axial collection. Multifocal white matter hyperintensity, most commonly due to chronic ischemic microangiopathy. Mild generalized atrophy no chronic microhemorrhage. Normal midline structures. Vascular: Normal flow voids. Skull and upper cervical  spine: Mild-to-moderate narrowing of the upper cervical spinal canal. Sinuses/Orbits: Negative. Other: None. IMPRESSION: 1. No acute intracranial abnormality. 2. Findings of chronic ischemic microangiopathy. 3. Mild-to-moderate narrowing of the upper cervical spinal canal. Electronically Signed   By: Ulyses Jarred M.D.   On: 07/01/2019 22:10   MR ABDOMEN W WO CONTRAST  Result Date: 07/05/2019 CLINICAL DATA:  Evaluate splenic mass. EXAM: MRI ABDOMEN WITHOUT AND WITH CONTRAST TECHNIQUE: Multiplanar multisequence MR imaging of the abdomen was performed both before and after the administration of intravenous contrast. CONTRAST:  7.78mL GADAVIST GADOBUTROL 1 MMOL/ML IV SOLN COMPARISON:  02/12/2009, 12/18/2011, 07/01/2019, and 07/04/2019. FINDINGS: Diminished exam detail secondary to respiratory motion artifact. Further, technical difficulties with image acquisition excludes area of concern within the left upper quadrant of the abdomen on the postcontrast sequences. Lower chest: No acute findings. Hepatobiliary: No suspicious liver abnormality identified. Cholecystectomy. Increase caliber of the common bile duct measures up to 1 cm. No choledocholithiasis. Pancreas:  No main duct dilatation, inflammation or mass  identified. Spleen: The spleen measures 9.9 by 5 by 14.7 cm (volume = 400 cm^3). Two nonaggressive, benign appearing cystic structures are identified within the spleen measuring up to 1.3 cm, image 9/25. Adjacent to the splenic hilum is a solid-appearing mass measuring 5.7 x 3.5 cm, image 14/11. This corresponds to the gradually enlarging mass described on recent CT. This has been present since 3/9/9 when it measured 1.0 x 1.2 cm. Background low T1 and T2 signal is identified within this structure, similar to the spleen. There are multiple internal areas of serpiginous increased T2 signal. Unfortunately, enhancement characteristics of this lesion cannot be further characterized due to this area excluded from  the post-contrast sequences. Adrenals/Urinary Tract: No masses identified. No evidence of hydronephrosis. Bilateral kidney stones. Stomach/Bowel: Visualized portions within the abdomen are unremarkable. Vascular/Lymphatic: Aortic atherosclerosis. No aneurysm. No abdominopelvic adenopathy identified. Other:  No ascites. Musculoskeletal: No suspicious bone lesions identified. IMPRESSION: 1. Diminished exam detail secondary to respiratory motion artifact. Additionally, due to technical error the area of concern within the left upper quadrant of the abdomen was excluded from the post-contrast images. 2. Again identified is a solid-appearing mass arising adjacent to the splenic hilum measuring 5.7 x 3.5 cm. This corresponds to the gradually enlarging mass described on recent CT of the abdomen and pelvis dated. This is been present since 07/29/2007 when it measured 1.0 x 1.2 cm, most likely reflect a benign indolent process. Similar to the enlarged spleen there has been interval development of internal cystic structures which are likely benign. Unfortunately the unusual CT enhancement pattern identified on 07/01/2019 cannot be further assessed due to factors described above. Assuming the patient is asymptomatic, and there is no known malignancy, follow-up imaging in 3-6 months with repeat contrast enhanced MRI of the abdomen is recommended. Ideally this should be performed as an outpatient when the patient is able to remain motionless head and perform the necessary breath hold techniques. 3. Splenomegaly. 4. Bilateral kidney stones. Electronically Signed   By: Kerby Moors M.D.   On: 07/05/2019 04:30   CT ABDOMEN PELVIS W CONTRAST  Result Date: 07/01/2019 CLINICAL DATA:  Flank pain. EXAM: CT ABDOMEN AND PELVIS WITH CONTRAST TECHNIQUE: Multidetector CT imaging of the abdomen and pelvis was performed using the standard protocol following bolus administration of intravenous contrast. CONTRAST:  169mL OMNIPAQUE IOHEXOL  300 MG/ML  SOLN COMPARISON:  CT scan 11/23/2014 FINDINGS: Lower chest: The lung bases are clear of an acute process. Basilar scarring changes and a somewhat mosaic pattern attenuation could be due to respiratory bronchiolitis, reactive airways disease or bronchitis. No worrisome pulmonary lesions or pleural effusion. The heart is normal in size. No pericardial effusion. Hepatobiliary: No focal hepatic lesions or intrahepatic biliary dilatation. The gallbladder surgically absent. No common bile duct dilatation. Pancreas: No mass, inflammation or ductal dilatation. Spleen: Low-attenuation lesion in the posterior aspect of the spleen is likely a benign cyst or hemangioma. There is a soft tissue mass between the spleen in the fundal region the stomach. This measures approximately 5.7 x 4.2 x 4.5 cm. Heterogeneous enhancement different than that of the spleen but it has been present since 2013 but is gradually enlarged. 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 MRI abdomen without and with contrast is recommended for further evaluation. Adrenals/Urinary Tract: The adrenal glands are unremarkable and stable. Numerous bilateral renal calculi with large staghorn type calculus noted in the lower pole region of the right kidney measuring up to 27 mm.  Lower pole calculus on the left measures a maximum of 19.5 mm. No worrisome renal lesions or hydroureteronephrosis. No obstructing ureteral calculi and no bladder calculi. No bladder mass is identified. Mild bilateral renal scarring changes. Stomach/Bowel: Small hiatal hernia but the stomach is otherwise unremarkable. The duodenum, small bowel and colon are unremarkable. No acute inflammatory changes, mass lesions or obstructive findings. The terminal ileum is normal. The appendix is normal. Vascular/Lymphatic: Moderate atherosclerotic calcifications involving the aorta but no aneurysm. The branch vessels are patent. The major venous structures  are patent. No mesenteric or retroperitoneal mass or adenopathy. Reproductive: The uterus and ovaries are unremarkable. Other: No pelvic mass or adenopathy. No free pelvic fluid collections. No inguinal mass or adenopathy. No abdominal wall hernia or subcutaneous lesions. Musculoskeletal: No acute bony findings or worrisome bone lesions. Degenerative anterolisthesis of L5 is noted. IMPRESSION: 1. Numerous bilateral renal calculi but no obstructing ureteral calculi or bladder calculi. 2. No acute abdominal/pelvic findings or lymphadenopathy. 3. 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 It has gradually enlarged since 2013 and is unlikely malignant. 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 MRI abdomen without and with contrast is recommended for further evaluation. 4. Status post cholecystectomy. No biliary dilatation. Aortic Atherosclerosis (ICD10-I70.0). Electronically Signed   By: Marijo Sanes M.D.   On: 07/01/2019 20:58   DG Chest Portable 1 View  Result Date: 07/01/2019 CLINICAL DATA:  Altered mental status. EXAM: PORTABLE CHEST 1 VIEW COMPARISON:  Radiograph 09/30/2013 FINDINGS: The cardiomediastinal contours are normal. Upper normal heart size likely accentuated by portable technique. The lungs are clear. Pulmonary vasculature is normal. No consolidation, pleural effusion, or pneumothorax. No acute osseous abnormalities are seen. IMPRESSION: No acute chest findings. Electronically Signed   By: Keith Rake M.D.   On: 07/01/2019 17:50   VAS [place] LOWER EXTREMITY VENOUS (DVT)  Result Date: 07/03/2019  Lower Venous DVTStudy Indications: Swelling, and Edema.  Limitations: Body habitus and poor ultrasound/tissue interface. Comparison Study: 03/02/2017 Performing Technologist: Antonieta Pert RDMS, RVT  Examination Guidelines: A complete evaluation includes B-mode imaging, spectral Doppler, color Doppler, and power Doppler as needed of all  accessible portions of each vessel. Bilateral testing is considered an integral part of a complete examination. Limited examinations for reoccurring indications may be performed as noted. The reflux portion of the exam is performed with the patient in reverse Trendelenburg.  +---------+---------------+---------+-----------+-----------------+------------+ RIGHT    CompressibilityPhasicitySpontaneityProperties       Thrombus                                                                  Aging        +---------+---------------+---------+-----------+-----------------+------------+ CFV      Full           Yes      Yes                                      +---------+---------------+---------+-----------+-----------------+------------+ SFJ      Full                                                             +---------+---------------+---------+-----------+-----------------+------------+  FV Prox  Full                                                             +---------+---------------+---------+-----------+-----------------+------------+ FV Mid   Full                                                             +---------+---------------+---------+-----------+-----------------+------------+ FV DistalFull                                                             +---------+---------------+---------+-----------+-----------------+------------+ PFV      Full                                                             +---------+---------------+---------+-----------+-----------------+------------+ POP      Full           Yes      Yes                                      +---------+---------------+---------+-----------+-----------------+------------+ PTV      Full                                                             +---------+---------------+---------+-----------+-----------------+------------+ PERO     Full                                                              +---------+---------------+---------+-----------+-----------------+------------+ Gastroc  Partial        No       No         rigid            Acute                                                    w/compression                 +---------+---------------+---------+-----------+-----------------+------------+  +----+---------------+---------+-----------+----------+--------------+ LEFTCompressibilityPhasicitySpontaneityPropertiesThrombus Aging +----+---------------+---------+-----------+----------+--------------+ CFV Full           Yes      Yes                                 +----+---------------+---------+-----------+----------+--------------+  SFJ Full                                                        +----+---------------+---------+-----------+----------+--------------+   Summary: RIGHT: - Findings consistent with acute deep vein thrombosis involving the right gastrocnemius veins. - No cystic structure found in the popliteal fossa.  LEFT: - No evidence of common femoral vein obstruction.  *See table(s) above for measurements and observations. Electronically signed by Harold Barban MD on 07/03/2019 at 7:29:54 AM.    Final        Subjective: NO COMPLAINTS.   Discharge Exam: Vitals:   07/04/19 2040 07/05/19 0554  BP: (!) 141/87 (!) 157/90  Pulse: 88 92  Resp: 20 19  Temp: 97.8 F (36.6 C) 98.8 F (37.1 C)  SpO2: 100% 98%   Vitals:   07/04/19 1538 07/04/19 1855 07/04/19 2040 07/05/19 0554  BP: (!) 180/93 131/70 (!) 141/87 (!) 157/90  Pulse: 75  88 92  Resp: (!) 22  20 19   Temp: 97.7 F (36.5 C)  97.8 F (36.6 C) 98.8 F (37.1 C)  TempSrc: Oral  Oral Oral  SpO2: 100%  100% 98%  Weight:      Height:        General: Pt is alert, awake, not in acute distress Cardiovascular: RRR, S1/S2 +, no rubs, no gallops Respiratory: CTA bilaterally, no wheezing, no rhonchi Abdominal: Soft, NT, ND, bowel sounds + Extremities: no  edema, no cyanosis    The results of significant diagnostics from this hospitalization (including imaging, microbiology, ancillary and laboratory) are listed below for reference.     Microbiology: Recent Results (from the past 240 hour(s))  Respiratory Panel by RT PCR (Flu A&B, Covid) - Nasopharyngeal Swab     Status: None   Collection Time: 07/01/19  5:00 PM   Specimen: Nasopharyngeal Swab  Result Value Ref Range Status   SARS Coronavirus 2 by RT PCR NEGATIVE NEGATIVE Final    Comment: (NOTE) SARS-CoV-2 target nucleic acids are NOT DETECTED. The SARS-CoV-2 RNA is generally detectable in upper respiratoy specimens during the acute phase of infection. The lowest concentration of SARS-CoV-2 viral copies this assay can detect is 131 copies/mL. A negative result does not preclude SARS-Cov-2 infection and should not be used as the sole basis for treatment or other patient management decisions. A negative result may occur with  improper specimen collection/handling, submission of specimen other than nasopharyngeal swab, presence of viral mutation(s) within the areas targeted by this assay, and inadequate number of viral copies (<131 copies/mL). A negative result must be combined with clinical observations, patient history, and epidemiological information. The expected result is Negative. Fact Sheet for Patients:  PinkCheek.be Fact Sheet for Healthcare Providers:  GravelBags.it This test is not yet ap proved or cleared by the [place] FDA and  has been authorized for detection and/or diagnosis of SARS-CoV-2 by FDA under an Emergency Use Authorization (EUA). This EUA will remain  in effect (meaning this test can be used) for the duration of the COVID-19 declaration under Section 564(b)(1) of the Act, 21 U.S.C. section 360bbb-3(b)(1), unless the authorization is terminated or revoked sooner.    Influenza A by PCR NEGATIVE  NEGATIVE Final   Influenza B by PCR NEGATIVE NEGATIVE Final    Comment: (NOTE) The Xpert Xpress SARS-CoV-2/FLU/RSV assay is intended as  an aid in  the diagnosis of influenza from Nasopharyngeal swab specimens and  should not be used as a sole basis for treatment. Nasal washings and  aspirates are unacceptable for Xpert Xpress SARS-CoV-2/FLU/RSV  testing. Fact Sheet for Patients: PinkCheek.be Fact Sheet for Healthcare Providers: GravelBags.it This test is not yet approved or cleared by the [place] FDA and  has been authorized for detection and/or diagnosis of SARS-CoV-2 by  FDA under an Emergency Use Authorization (EUA). This EUA will remain  in effect (meaning this test can be used) for the duration of the  Covid-19 declaration under Section 564(b)(1) of the Act, 21  U.S.C. section 360bbb-3(b)(1), unless the authorization is  terminated or revoked. Performed at [facility], [place] [address] [place], [place] [address]      Labs: BNP (last 3 results) No results for input(s): BNP in the last 8760 hours. Basic Metabolic Panel: Recent Labs  Lab 07/01/19 1641 07/01/19 2350 07/02/19 0147 07/03/19 0819 07/04/19 0948  NA 147*  --  145 139 142  K 3.9  --  3.8 3.4* 3.8  CL 118*  --  119* 108 112*  CO2 21*  --  19* 20* 20*  GLUCOSE 136*  --  162* 148* 146*  BUN 14  --  11 6* <5*  CREATININE 0.83 0.59 0.73 0.65 0.64  CALCIUM 9.9  --  8.9 9.2 9.0  MG  --  1.4*  --  1.7  --   PHOS  --  2.6  --   --   --    Liver Function Tests: Recent Labs  Lab 07/01/19 1641  AST 20  ALT 26  ALKPHOS 51  BILITOT 0.8  PROT 7.2  ALBUMIN 3.8   No results for input(s): LIPASE, AMYLASE in the last 168 hours. No results for input(s): AMMONIA in the last 168 hours. CBC: Recent Labs  Lab 07/01/19 1641 07/01/19 1641 07/01/19 2350 07/01/19 2350 07/02/19 0147 07/02/19 0147 07/02/19 0503 07/02/19 2007  07/03/19 0516 07/04/19 0444 07/05/19 0516  WBC 7.6   < > 7.6  --  8.5  --   --   --  9.1 7.1 7.9  NEUTROABS 5.0  --   --   --   --   --   --   --   --   --   --   HGB 7.5*   < > 6.8*   < > 6.8*   < > 6.9* 7.9* 7.9* 7.7* 7.6*  HCT 29.5*   < > 25.9*   < > 26.3*   < > 27.3* 28.6* 29.3* 28.7* 28.4*  MCV 72.5*   < > 72.5*  --  72.7*  --   --   --  74.6* 74.2* 75.1*  PLT 420*   < > 392  --  422*  --   --   --  379 386 380   < > = values in this interval not displayed.   Cardiac Enzymes: Recent Labs  Lab 07/01/19 1641  CKTOTAL 11*   BNP: Invalid input(s): POCBNP CBG: Recent Labs  Lab 07/04/19 0802 07/04/19 1214 07/04/19 1650 07/04/19 2252 07/05/19 0747  GLUCAP 149* 133* 260* 256* 169*   D-Dimer No results for input(s): DDIMER in the last 72 hours. Hgb A1c No results for input(s): HGBA1C in the last 72 hours. Lipid Profile No results for input(s): CHOL, HDL, LDLCALC, TRIG, CHOLHDL, LDLDIRECT in the last 72 hours. Thyroid function studies No results for input(s): TSH,  T4TOTAL, T3FREE, THYROIDAB in the last 72 hours.  Invalid input(s): FREET3 Anemia work up No results for input(s): VITAMINB12, FOLATE, FERRITIN, TIBC, IRON, RETICCTPCT in the last 72 hours. Urinalysis    Component Value Date/Time   COLORURINE STRAW (A) 07/01/2019 2147   APPEARANCEUR CLEAR 07/01/2019 2147   LABSPEC 1.025 07/01/2019 2147   PHURINE 5.0 07/01/2019 2147   GLUCOSEU NEGATIVE 07/01/2019 2147   GLUCOSEU NEGATIVE 03/23/2014 1436   HGBUR SMALL (A) 07/01/2019 2147   BILIRUBINUR NEGATIVE 07/01/2019 2147   BILIRUBINUR 1 11/15/2011 [place] 07/01/2019 2147   PROTEINUR NEGATIVE 07/01/2019 2147   UROBILINOGEN 0.2 11/23/2014 1342   NITRITE NEGATIVE 07/01/2019 2147   LEUKOCYTESUR TRACE (A) 07/01/2019 2147   Sepsis Labs Invalid input(s): PROCALCITONIN,  WBC,  LACTICIDVEN Microbiology Recent Results (from the past 240 hour(s))  Respiratory Panel by RT PCR (Flu A&B, Covid) -  Nasopharyngeal Swab     Status: None   Collection Time: 07/01/19  5:00 PM   Specimen: Nasopharyngeal Swab  Result Value Ref Range Status   SARS Coronavirus 2 by RT PCR NEGATIVE NEGATIVE Final    Comment: (NOTE) SARS-CoV-2 target nucleic acids are NOT DETECTED. The SARS-CoV-2 RNA is generally detectable in upper respiratoy specimens during the acute phase of infection. The lowest concentration of SARS-CoV-2 viral copies this assay can detect is 131 copies/mL. A negative result does not preclude SARS-Cov-2 infection and should not be used as the sole basis for treatment or other patient management decisions. A negative result may occur with  improper specimen collection/handling, submission of specimen other than nasopharyngeal swab, presence of viral mutation(s) within the areas targeted by this assay, and inadequate number of viral copies (<131 copies/mL). A negative result must be combined with clinical observations, patient history, and epidemiological information. The expected result is Negative. Fact Sheet for Patients:  PinkCheek.be Fact Sheet for Healthcare Providers:  GravelBags.it This test is not yet ap proved or cleared by the [place] FDA and  has been authorized for detection and/or diagnosis of SARS-CoV-2 by FDA under an Emergency Use Authorization (EUA). This EUA will remain  in effect (meaning this test can be used) for the duration of the COVID-19 declaration under Section 564(b)(1) of the Act, 21 U.S.C. section 360bbb-3(b)(1), unless the authorization is terminated or revoked sooner.    Influenza A by PCR NEGATIVE NEGATIVE Final   Influenza B by PCR NEGATIVE NEGATIVE Final    Comment: (NOTE) The Xpert Xpress SARS-CoV-2/FLU/RSV assay is intended as an aid in  the diagnosis of influenza from Nasopharyngeal swab specimens and  should not be used as a sole basis for treatment. Nasal washings and  aspirates  are unacceptable for Xpert Xpress SARS-CoV-2/FLU/RSV  testing. Fact Sheet for Patients: PinkCheek.be Fact Sheet for Healthcare Providers: GravelBags.it This test is not yet approved or cleared by the [place] FDA and  has been authorized for detection and/or diagnosis of SARS-CoV-2 by  FDA under an Emergency Use Authorization (EUA). This EUA will remain  in effect (meaning this test can be used) for the duration of the  Covid-19 declaration under Section 564(b)(1) of the Act, 21  U.S.C. section 360bbb-3(b)(1), unless the authorization is  terminated or revoked. Performed at [facility], [place] [address], [place], [place] [address]      Time coordinating discharge: 2 MINUTES  SIGNED:   Hosie Poisson, MD  [facility] 07/05/2019, 10:55 AM

## 2019-07-05 NOTE — Discharge Instructions (Signed)
Information on my medicine - ELIQUIS (apixaban)  This medication education was reviewed with me or my healthcare representative as part of my discharge preparation.    Why was Eliquis prescribed for you? Eliquis was prescribed to treat blood clots that may have been found in the veins of your legs (deep vein thrombosis) or in your lungs (pulmonary embolism) and to reduce the risk of them occurring again.  What do You need to know about Eliquis ? The starting dose is 10 mg (two 5 mg tablets) taken TWICE daily for the FIRST SEVEN (7) DAYS, then on  07/11/2019  the dose is reduced to ONE 5 mg tablet taken TWICE daily.  Eliquis may be taken with or without food.   Try to take the dose about the same time in the morning and in the evening. If you have difficulty swallowing the tablet whole please discuss with your pharmacist how to take the medication safely.  Take Eliquis exactly as prescribed and DO NOT stop taking Eliquis without talking to the doctor who prescribed the medication.  Stopping may increase your risk of developing a new blood clot.  Refill your prescription before you run out.  After discharge, you should have regular check-up appointments with your healthcare provider that is prescribing your Eliquis.    What do you do if you miss a dose? If a dose of ELIQUIS is not taken at the scheduled time, take it as soon as possible on the same day and twice-daily administration should be resumed. The dose should not be doubled to make up for a missed dose.  Important Safety Information A possible side effect of Eliquis is bleeding. You should call your healthcare provider right away if you experience any of the following: ? Bleeding from an injury or your nose that does not stop. ? Unusual colored urine (red or dark brown) or unusual colored stools (red or black). ? Unusual bruising for unknown reasons. ? A serious fall or if you hit your head (even if there is no  bleeding).  Some medicines may interact with Eliquis and might increase your risk of bleeding or clotting while on Eliquis. To help avoid this, consult your healthcare provider or pharmacist prior to using any new prescription or non-prescription medications, including herbals, vitamins, non-steroidal anti-inflammatory drugs (NSAIDs) and supplements.  This website has more information on Eliquis (apixaban): http://www.eliquis.com/eliquis/home

## 2019-07-05 NOTE — Progress Notes (Signed)
Pt leaving this afternoon with her daughter. Discharge instructions given/explained with pt verbalizing understanding. Daughter present. Pt and daughter aware of followup appt.

## 2019-07-05 NOTE — TOC Progression Note (Signed)
Transition of Care [facility]) - Progression Note    Patient Details  Name: MONISHA DELEO MRN: [id number] Date of Birth: 06/23/1950  [facility] CM/SW Contact  Joaquin Courts, RN Phone Number: 07/05/2019, 11:41 AM  Clinical Narrative:    [facility] HH[facility]/OT/RN.     Expected Discharge Plan: [place] Barriers to Discharge: No Barriers Identified  Expected Discharge Plan and Services Expected Discharge Plan: [place]   Discharge Planning Services: CM Consult Post Acute Care Choice: [place] arrangements for the past 2 months: Single Family Home Expected Discharge Date: 07/05/19               DME Arranged: N/A DME Agency: NA       HH Arranged: PT, OT, RN [place] Agency: [place] ([place]) Date HH Agency Contacted: 07/05/19 Time [place]: 1140 Representative spoke with at [place]: [place] ([place]) Interventions    Readmission Risk Interventions No flowsheet data found.

## 2019-07-08 ENCOUNTER — Encounter (HOSPITAL_COMMUNITY): Payer: Self-pay | Admitting: Emergency Medicine

## 2019-07-08 ENCOUNTER — Emergency Department (HOSPITAL_COMMUNITY)
Admission: EM | Admit: 2019-07-08 | Discharge: 2019-07-08 | Disposition: A | Discharge status: Home / Self Care | Payer: Medicare Other | Attending: Emergency Medicine | Admitting: Emergency Medicine

## 2019-07-08 ENCOUNTER — Emergency Department (HOSPITAL_BASED_OUTPATIENT_CLINIC_OR_DEPARTMENT_OTHER): Payer: Medicare Other

## 2019-07-08 ENCOUNTER — Other Ambulatory Visit: Payer: Self-pay

## 2019-07-08 DIAGNOSIS — M7989 Other specified soft tissue disorders: Secondary | ICD-10-CM | POA: Diagnosis not present

## 2019-07-08 DIAGNOSIS — E11649 Type 2 diabetes mellitus with hypoglycemia without coma: Secondary | ICD-10-CM | POA: Diagnosis not present

## 2019-07-08 DIAGNOSIS — M79604 Pain in right leg: Secondary | ICD-10-CM

## 2019-07-08 DIAGNOSIS — Z86718 Personal history of other venous thrombosis and embolism: Secondary | ICD-10-CM

## 2019-07-08 DIAGNOSIS — I10 Essential (primary) hypertension: Secondary | ICD-10-CM | POA: Diagnosis not present

## 2019-07-08 DIAGNOSIS — I82491 Acute embolism and thrombosis of other specified deep vein of right lower extremity: Secondary | ICD-10-CM | POA: Diagnosis not present

## 2019-07-08 DIAGNOSIS — Z7901 Long term (current) use of anticoagulants: Secondary | ICD-10-CM | POA: Insufficient documentation

## 2019-07-08 DIAGNOSIS — I82452 Acute embolism and thrombosis of left peroneal vein: Secondary | ICD-10-CM | POA: Diagnosis not present

## 2019-07-08 DIAGNOSIS — M25572 Pain in left ankle and joints of left foot: Secondary | ICD-10-CM | POA: Diagnosis present

## 2019-07-08 DIAGNOSIS — Z87891 Personal history of nicotine dependence: Secondary | ICD-10-CM | POA: Insufficient documentation

## 2019-07-08 DIAGNOSIS — M79605 Pain in left leg: Secondary | ICD-10-CM

## 2019-07-08 DIAGNOSIS — Z7984 Long term (current) use of oral hypoglycemic drugs: Secondary | ICD-10-CM | POA: Diagnosis not present

## 2019-07-08 DIAGNOSIS — Z79899 Other long term (current) drug therapy: Secondary | ICD-10-CM | POA: Insufficient documentation

## 2019-07-08 DIAGNOSIS — I82463 Acute embolism and thrombosis of calf muscular vein, bilateral: Secondary | ICD-10-CM

## 2019-07-08 DIAGNOSIS — E162 Hypoglycemia, unspecified: Secondary | ICD-10-CM

## 2019-07-08 LAB — CK: Total CK: 14 U/L — ABNORMAL LOW (ref 38–234)

## 2019-07-08 LAB — CBC WITH DIFFERENTIAL/PLATELET
Abs Immature Granulocytes: 0.1 10*3/uL — ABNORMAL HIGH (ref 0.00–0.07)
Basophils Absolute: 0.1 10*3/uL (ref 0.0–0.1)
Basophils Relative: 1 %
Eosinophils Absolute: 0.3 10*3/uL (ref 0.0–0.5)
Eosinophils Relative: 2 %
HCT: 36.3 % (ref 36.0–46.0)
Hemoglobin: 9.8 g/dL — ABNORMAL LOW (ref 12.0–15.0)
Immature Granulocytes: 1 %
Lymphocytes Relative: 14 %
Lymphs Abs: 1.7 10*3/uL (ref 0.7–4.0)
MCH: 21 pg — ABNORMAL LOW (ref 26.0–34.0)
MCHC: 27 g/dL — ABNORMAL LOW (ref 30.0–36.0)
MCV: 77.7 fL — ABNORMAL LOW (ref 80.0–100.0)
Monocytes Absolute: 1 10*3/uL (ref 0.1–1.0)
Monocytes Relative: 8 %
Neutro Abs: 9.2 10*3/uL — ABNORMAL HIGH (ref 1.7–7.7)
Neutrophils Relative %: 74 %
Platelets: 434 10*3/uL — ABNORMAL HIGH (ref 150–400)
RBC: 4.67 MIL/uL (ref 3.87–5.11)
RDW: 24 % — ABNORMAL HIGH (ref 11.5–15.5)
WBC: 12.4 10*3/uL — ABNORMAL HIGH (ref 4.0–10.5)
nRBC: 0 % (ref 0.0–0.2)

## 2019-07-08 LAB — MAGNESIUM: Magnesium: 1.9 mg/dL (ref 1.7–2.4)

## 2019-07-08 LAB — BASIC METABOLIC PANEL
Anion gap: 13 (ref 5–15)
BUN: 12 mg/dL (ref 8–23)
CO2: 21 mmol/L — ABNORMAL LOW (ref 22–32)
Calcium: 9.7 mg/dL (ref 8.9–10.3)
Chloride: 112 mmol/L — ABNORMAL HIGH (ref 98–111)
Creatinine, Ser: 0.81 mg/dL (ref 0.44–1.00)
GFR calc Af Amer: >60 mL/min (ref >60)
GFR calc non Af Amer: >60 mL/min (ref >60)
Glucose, Bld: 55 mg/dL — ABNORMAL LOW (ref 70–99)
Potassium: 2.9 mmol/L — ABNORMAL LOW (ref 3.5–5.1)
Sodium: 146 mmol/L — ABNORMAL HIGH (ref 135–145)

## 2019-07-08 LAB — CBG MONITORING, ED
Glucose-Capillary: 106 mg/dL — ABNORMAL HIGH (ref 70–99)
Glucose-Capillary: 69 mg/dL — ABNORMAL LOW (ref 70–99)

## 2019-07-08 MED ORDER — SODIUM CHLORIDE 0.9 % IV BOLUS
1000.0000 mL | Freq: Once | INTRAVENOUS | Status: AC
  Administered 2019-07-08: 1000 mL via INTRAVENOUS

## 2019-07-08 MED ORDER — MORPHINE SULFATE (PF) 4 MG/ML IV SOLN
4.0000 mg | Freq: Once | INTRAVENOUS | Status: AC
  Administered 2019-07-08: 4 mg via INTRAVENOUS
  Filled 2019-07-08: qty 1

## 2019-07-08 MED ORDER — POTASSIUM CHLORIDE CRYS ER 20 MEQ PO TBCR
40.0000 meq | EXTENDED_RELEASE_TABLET | Freq: Once | ORAL | Status: AC
  Administered 2019-07-08: 40 meq via ORAL
  Filled 2019-07-08: qty 2

## 2019-07-08 NOTE — ED Triage Notes (Signed)
Pt reports that when she was seen her on 2/10 was diagnosed with blood clot in right ankle. Reports still having pains causing pt not to be able to sleep or  Eat.

## 2019-07-08 NOTE — Progress Notes (Signed)
Bilateral lower extremity venous duplex completed. Refer to "CV Proc" under chart review to view preliminary results.  Critical results discussed with Franchot Heidelberg, PA-C.  07/08/2019 4:08 PM Kelby Aline., MHA, RVT, RDCS, RDMS

## 2019-07-08 NOTE — ED Provider Notes (Addendum)
[facility] DEPT Provider Note   CSN: [id number] Arrival date & time: 07/08/19  1304     History Chief Complaint  Patient presents with  . Ankle Pain    DVT    Julie [name] is a 69 y.o. female.  HPI  Patient is 69 year old female with history of anxiety, depression, DM 2, hypertension recently diagnosed with DVT on February 9 when she presented to the ED with altered mental status and lower extremity pain.  She was found to have a DVT in her right lower extremity placed on heparin and admitted to the hospital.  She was also found to be anemic with a hemoglobin of 6.8--last comparison was 3 years ago with hemoglobin of 11.    On my review of EMR.  It appears that patient's metabolic encephalopathy caused her confusion was due to hypernatremia and dehydration and possibly due to medication use.  She had normal MRI and CT of the head and was discharged with instructions to follow-up with neurology for evaluation of dementia.  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  Study was limited secondary to motion artifact.  Patient was transitioned from IV heparin to Eliquis and discharged on Eliquis.    Discussed case with Julie [name] who is patient's daughter. Per daughter patient has been using Percocet every 6 hours without pain control.  And has been refusing to eat over the past day or 2 due to pain.  She states that she has been consistent pain since she was discharged from the hospital however seems to been worse the last 2 days.     Past Medical History:  Diagnosis Date  . Anxiety and depression   . DJD (degenerative joint disease)   . GERD (gastroesophageal reflux disease)   .  History of gastric ulcer    w/  bleeding-  05/ 2015  . History of kidney stones   . Hypertension 2011  . Migraines   . Nephrolithiasis    bilateral  . Osteoporosis   . Right ureteral stone   . Type 2 diabetes mellitus [facility])     Patient Active Problem List   Diagnosis Date Noted  . Duodenal ulcer   . Gastroesophageal reflux disease with esophagitis without hemorrhage   . Acute encephalopathy 07/01/2019  . PCP NOTES >>> 03/18/2015  . Gastric ulcer 10/01/2013  . Palpitations 09/30/2013  . Annual physical exam 07/28/2013  . Anxiety and depression 04/15/2012  . Osteoporosis 12/07/2011  . Diabetes mellitus ([place]) 07/26/2011  . HTN (hypertension) 07/26/2011  . Hyperlipidemia 07/26/2011    Past Surgical History:  Procedure Laterality Date  . BIOPSY  07/04/2019   Procedure: BIOPSY;  Surgeon: [name], MD;  Location: [facility];  Service: Gastroenterology;;  . Sharlett Iles  . ESOPHAGOGASTRODUODENOSCOPY N/A 10/01/2013   Procedure: ESOPHAGOGASTRODUODENOSCOPY (EGD);  Surgeon: Milus Banister, MD;  Location: [facility];  Service: Endoscopy;  Laterality: N/A;  . ESOPHAGOGASTRODUODENOSCOPY (EGD) WITH PROPOFOL N/A 07/04/2019   Procedure: ESOPHAGOGASTRODUODENOSCOPY (EGD) WITH PROPOFOL;  Surgeon: [name], MD;  Location: [facility];  Service: Gastroenterology;  Laterality: N/A;  . WISDOM TOOTH EXTRACTION       OB History    Gravida  1  Para  1   Term  1   Preterm      AB      Living  1     SAB      TAB      Ectopic      Multiple      Live Births  1           Family History  Problem Relation Age of Onset  . Cancer Mother        ovary/uterus cancer  . Hyperlipidemia Mother        M and F  . Heart disease Mother        M had CHF  . Hypertension Mother        Jerilynn Mages and F  . Diabetes Father   . Mental illness Maternal Grandmother   . Hypertension Maternal Grandmother   . Breast cancer Maternal Grandmother   . Mental illness Maternal  Grandfather   . Hypertension Maternal Grandfather   . Stomach cancer Maternal Grandfather   . Mental illness Paternal Grandmother   . Hypertension Paternal Grandmother   . Breast cancer Paternal Grandmother   . Mental illness Paternal Grandfather   . Hypertension Paternal Grandfather   . Colon cancer Neg Hx     Social History   Tobacco Use  . Smoking status: Former Smoker    Types: Cigarettes    Quit date: 05/23/1979    Years since quitting: 40.1  . Smokeless tobacco: Never Used  Substance Use Topics  . Alcohol use: No    Comment: ocass  . Drug use: No    Home Medications Prior to Admission medications   Medication Sig Start Date End Date Taking? Authorizing Provider  acetaminophen (TYLENOL) 500 MG tablet Take 500 mg by mouth every 6 (six) hours as needed for moderate pain or headache.    Yes [provider]  ALPRAZolam (XANAX) 0.25 MG tablet Take 2 tablets (0.5 mg total) by mouth at bedtime as needed for anxiety. 11/18/15  Yes Paz, Alda Berthold, MD  apixaban (ELIQUIS) 5 MG TABS tablet Take 2 tablets (10 mg total) by mouth 2 (two) times daily for 5 days. 07/05/19 07/10/19 Yes Hosie Poisson, MD  atorvastatin (LIPITOR) 10 MG tablet Take 1 tablet (10 mg total) by mouth daily. 07/06/15  Yes Paz, Alda Berthold, MD  buPROPion (WELLBUTRIN XL) 150 MG 24 hr tablet Take 75 mg by mouth daily.  04/23/19  Yes [provider]  diltiazem (CARTIA XT) 240 MG 24 hr capsule Take 1 capsule (240 mg total) by mouth daily. 10/19/15  Yes Paz, Alda Berthold, MD  fenofibrate micronized (LOFIBRA) 200 MG capsule Take 1 capsule (200 mg total) by mouth daily before breakfast. 07/01/15  Yes Colon Branch, MD  ferrous sulfate 325 (65 FE) MG tablet Take 1 tablet (325 mg total) by mouth 2 (two) times daily with a meal. 07/05/19  Yes Hosie Poisson, MD  glimepiride (AMARYL) 4 MG tablet Take 1.5 tablets (6 mg total) by mouth daily with breakfast. 11/18/15  Yes Colon Branch, MD  losartan (COZAAR) 50 MG tablet Take 25 mg by mouth  daily.  04/22/19  Yes [provider]  magnesium oxide (MAG-OX) 400 (241.3 Mg) MG tablet Take 1 tablet (400 mg total) by mouth 2 (two) times daily. 07/05/19  Yes Hosie Poisson, MD  metFORMIN (GLUCOPHAGE) 1000 MG tablet Take 1 tablet (1,000 mg total) by mouth 2 (two) times daily with a meal. 11/18/15  Yes Brady, [place],  MD  oxyCODONE-acetaminophen (PERCOCET/ROXICET) 5-325 MG tablet Take 1 tablet by mouth every 6 (six) hours as needed for up to 5 days for severe pain. 07/05/19 07/10/19 Yes Hosie Poisson, MD  pantoprazole (PROTONIX) 40 MG tablet 40 mg twice daily for one month followed by 40 mg daily indefinitely. Patient taking differently: Take 40 mg by mouth daily.  07/05/19  Yes Hosie Poisson, MD  PARoxetine (PAXIL) 40 MG tablet Take 1 tablet (40 mg total) by mouth daily. 10/19/15  Yes Paz, Alda Berthold, MD  apixaban (ELIQUIS) 5 MG TABS tablet Take 1 tablet (5 mg total) by mouth 2 (two) times daily. 07/11/19   Hosie Poisson, MD    Allergies    Patient has no known allergies.  Review of Systems   Review of Systems  Constitutional: Negative for chills and fever.  HENT: Negative for congestion.   Eyes: Negative for pain.  Respiratory: Negative for cough and shortness of breath.   Cardiovascular: Negative for chest pain and leg swelling.  Gastrointestinal: Negative for abdominal pain and vomiting.  Genitourinary: Negative for dysuria.  Musculoskeletal:       Bilateral leg pain  Skin: Negative for rash.  Neurological: Negative for dizziness and headaches.    Physical Exam Updated Vital Signs BP (!) 160/100   Pulse (!) 114   Temp 97.9 F (36.6 C) (Oral)   Resp 18   SpO2 100%   Physical Exam Vitals and nursing note reviewed.  Constitutional:      General: She is not in acute distress. HENT:     Head: Normocephalic and atraumatic.     Nose: Nose normal.     Mouth/Throat:     Mouth: Mucous membranes are dry.     Pharynx: No oropharyngeal exudate or posterior oropharyngeal erythema.    Eyes:     General: No scleral icterus. Cardiovascular:     Rate and Rhythm: Regular rhythm. Tachycardia present.     Pulses: Normal pulses.     Heart sounds: Normal heart sounds. No murmur. No gallop.      Comments: Heart rate 108 on my exam; DP/PT pulses are 3+ and symmetric. Pulmonary:     Effort: Pulmonary effort is normal. No respiratory distress.     Breath sounds: Normal breath sounds. No wheezing.  Abdominal:     Palpations: Abdomen is soft.     Tenderness: There is no abdominal tenderness. There is no guarding or rebound.  Musculoskeletal:     Cervical back: Normal range of motion.     Right lower leg: No edema.     Left lower leg: No edema.     Comments: Bilateral lower extremities with no significant/notable edema.  No swelling or tenderness to palpation on exam.  Skin:    General: Skin is warm and dry.     Capillary Refill: Capillary refill takes less than 2 seconds.     Comments: No cellulitic changes, no redness, erythema or induration or fluctuance skin.  Neurological:     Mental Status: She is alert. Mental status is at baseline.     Motor: No weakness.     Comments: Sensation intact bilateral lower extremities grossly to touch.  Strength 5/5 bilateral lower extremity flexion extension of his ankle and knees  Psychiatric:        Mood and Affect: Mood normal.        Behavior: Behavior normal.       ED Results / Procedures / Treatments   Labs (all labs ordered are  listed, but only abnormal results are displayed) Labs Reviewed  BASIC METABOLIC PANEL - Abnormal; Notable for the following components:      Result Value   Sodium 146 (*)    Potassium 2.9 (*)    Chloride 112 (*)    CO2 21 (*)    Glucose, Bld 55 (*)    All other components within normal limits  CBC WITH DIFFERENTIAL/PLATELET - Abnormal; Notable for the following components:   WBC 12.4 (*)    Hemoglobin 9.8 (*)    MCV 77.7 (*)    MCH 21.0 (*)    MCHC 27.0 (*)    RDW 24.0 (*)    Platelets 434  (*)    Neutro Abs 9.2 (*)    Abs Immature Granulocytes 0.10 (*)    All other components within normal limits  CK - Abnormal; Notable for the following components:   Total CK 14 (*)    All other components within normal limits  CBG MONITORING, ED - Abnormal; Notable for the following components:   Glucose-Capillary 69 (*)    All other components within normal limits  CBG MONITORING, ED - Abnormal; Notable for the following components:   Glucose-Capillary 106 (*)    All other components within normal limits  MAGNESIUM    EKG EKG Interpretation  Date/Time:  Tuesday July 08 2019 15:24:11 EST Ventricular Rate:  96 PR Interval:    QRS Duration: 81 QT Interval:  341 QTC Calculation: 431 R Axis:   10 Text Interpretation: Sinus rhythm Low voltage, precordial leads Consider anterior infarct Borderline ST depression, lateral leads Confirmed by Madalyn Rob [phone]) on 07/08/2019 3:34:49 PM   Radiology VAS [place] LOWER EXTREMITY VENOUS (DVT) ([facility] 7a-7p)  Result Date: 07/08/2019  Lower Venous DVTStudy Indications: Swelling, and recent DVT.  Comparison Study: 07/01/2019- positive right intramuscular DVT Performing Technologist: Maudry Mayhew MHA, RDMS, RVT, RDCS  Examination Guidelines: A complete evaluation includes B-mode imaging, spectral Doppler, color Doppler, and power Doppler as needed of all accessible portions of each vessel. Bilateral testing is considered an integral part of a complete examination. Limited examinations for reoccurring indications may be performed as noted. The reflux portion of the exam is performed with the patient in reverse Trendelenburg.  +---------+---------------+---------+-----------+----------+--------------+ RIGHT    CompressibilityPhasicitySpontaneityPropertiesThrombus Aging +---------+---------------+---------+-----------+----------+--------------+ CFV      Full           Yes      Yes                                  +---------+---------------+---------+-----------+----------+--------------+ SFJ      Full                                                        +---------+---------------+---------+-----------+----------+--------------+ FV Prox  Full                                                        +---------+---------------+---------+-----------+----------+--------------+ FV Mid   Full                                                        +---------+---------------+---------+-----------+----------+--------------+  FV DistalFull                                                        +---------+---------------+---------+-----------+----------+--------------+ PFV      Full                                                        +---------+---------------+---------+-----------+----------+--------------+ POP      Full           Yes      Yes                                 +---------+---------------+---------+-----------+----------+--------------+ PTV      Full                                                        +---------+---------------+---------+-----------+----------+--------------+ PERO     Full                                                        +---------+---------------+---------+-----------+----------+--------------+ Soleal   None                    No                   Acute          +---------+---------------+---------+-----------+----------+--------------+ Gastroc  Full                                                        +---------+---------------+---------+-----------+----------+--------------+   +---------+---------------+---------+-----------+----------+--------------+ LEFT     CompressibilityPhasicitySpontaneityPropertiesThrombus Aging +---------+---------------+---------+-----------+----------+--------------+ CFV      Full           Yes      Yes                                  +---------+---------------+---------+-----------+----------+--------------+ SFJ      Full                                                        +---------+---------------+---------+-----------+----------+--------------+ FV Prox  Full                                                        +---------+---------------+---------+-----------+----------+--------------+  FV Mid   Full                                                        +---------+---------------+---------+-----------+----------+--------------+ FV DistalFull                                                        +---------+---------------+---------+-----------+----------+--------------+ PFV      Full                                                        +---------+---------------+---------+-----------+----------+--------------+ POP      Full           Yes      Yes                                 +---------+---------------+---------+-----------+----------+--------------+ PTV      Full                                                        +---------+---------------+---------+-----------+----------+--------------+ PERO     None                    No                   Acute          +---------+---------------+---------+-----------+----------+--------------+ Soleal   None                    No                   Acute          +---------+---------------+---------+-----------+----------+--------------+ Gastroc  Full                                                        +---------+---------------+---------+-----------+----------+--------------+     Summary: RIGHT: - Findings consistent with acute deep vein thrombosis involving the right soleal veins. Note: when looking at prior study's images, it appears that this thrombus is unchanged. Previously, it was named the gastrocnemius vein, however it appears to have been in the soleal vein.  - No cystic structure found in the popliteal fossa.   LEFT: - Findings consistent with acute deep vein thrombosis involving the left peroneal veins, and left soleal veins. - No cystic structure found in the popliteal fossa.  *See table(s) above for measurements and observations. Electronically signed by Deitra Mayo MD on 07/08/2019 at 4:11:53 PM.    Final     Procedures Procedures (including critical care time)  Medications Ordered in ED Medications  sodium chloride  0.9 % bolus 1,000 mL (0 mLs Intravenous Stopped 07/08/19 1709)  morphine 4 MG/ML injection 4 mg (4 mg Intravenous Given 07/08/19 1542)  potassium chloride SA (KLOR-CON) CR tablet 40 mEq (40 mEq Oral Given 07/08/19 1713)    ED Course  I have reviewed the triage vital signs and the nursing notes.  Pertinent labs & imaging results that were available during my care of the patient were reviewed by me and considered in my medical decision making (see chart for details).  Patient is a 69 year old female with history of dementia, anxiety and depression, hypertension, DM 2  She is presenting today with bilateral leg pain that she states is severe, she is somewhat tachycardic at 110/related my exam.  Patient is tolerating with no other modalities on exam.  He has good pulses, sensation, strength bilaterally.  Good cap refill.  No significant calf tenderness to palpation or lower extremity edema.  I discussed this case with my attending physician who cosigned this note including patient's presenting symptoms, physical exam, and planned diagnostics and interventions. Attending physician stated agreement with plan or made changes to plan which were implemented.   Attending physician assessed patient at bedside.   I will obtain bilateral lower extremity ultrasounds to assess for worsening clot burden.  Will obtain basic labs and CK.  Clinical Course as of Jul 08 1001  Tue Jul 08, 2019  1654 EKG independently read by myself as well as reviewed by my attending physician Dr. Roslynn Amble.   There is no signs of acute ischemia.   [WF]  Wed Jul 09, 2019  1002 I independently reviewed the lower extremity ultrasound results. I agree with the radiology read. It appears the clot is unchanged in size and extent to before. Patient is already on optimum therapy with bloodthinners. RIGHT: - Findings consistent with acute deep vein thrombosis involving the right soleal veins. Note: when looking at prior study's images, it appears that this thrombus is unchanged. Previously, it was named the gastrocnemius vein, however it appears to have  been in the soleal vein.  - No cystic structure found in the popliteal fossa.  LEFT: - Findings consistent with acute deep vein thrombosis involving the left peroneal veins, and left soleal veins. - No cystic structure found in the popliteal fossa.   [WF]    Clinical Course User Index [WF] Tedd Sias, [place]   Patient had CBC with mild leukocytosis however there is no infectious symptoms the patient is complaining of today.  Doubt cellulitis or infection of her legs causing her symptoms as there is no underlying skin changes.  Patient did have anemia during prior hospitalization and required transfusion however today her hemoglobin is 9.8 which is dramatically improved.  No other acute abnormalities on CBC.  BMP with some mild left-sided abnormalities including potassium of 2.9-she is repleted with oral potassium.  She states that she takes oral potassium at home however she has not been taking her medications recently due to her having decreased appetite.  CK within normal limits.  She is mildly hyperglycemic however she is asymptomatic of this is mentating well and has no abdominal pain.  Will provide patient with some food and recheck.  On recheck it was 64 which is improved however will continue to encourage p.o. food and recheck before discharge.  Patient is well-appearing at this time will discharge patient with follow-up with PCP.  She was noted to  have tachycardia the last vital signs however on my exam her heart rate  is 90 at time of discharge.  She is well-appearing and tolerating p.o. her blood sugar is much improved after she is ate a sandwich.  MDM Rules/Calculators/A&P                       Patient with known DVT in right LE presents today with pain unchanged from when she was initially diagnosed. She is taking percocet and on elliquis. She has been taking both as prescribed. She denies throughout visit that she has any CP/SOB/Rib pain/abdominal pain or any pleuritic discomfort. She sats 100% on room air and is not tachycardic -- contrary to the last two vital sign measurements. Since I doubt PE and patient has unchanged clot burden and is already on ideal therapy I will discharge patient with PCP follow up. Her pain is well controlled at time of discharge after single 4mg  morphine dose.   Final Clinical Impression(s) / ED Diagnoses Final diagnoses:  Deep vein thrombosis (DVT) of calf muscle vein of both lower extremities, unspecified chronicity (HCC)  Pain in both lower extremities  Hypoglycemia    Rx / DC Orders ED Discharge Orders    None       Tedd Sias, [place] 07/08/19 1831    Tedd Sias, [place] 07/09/19 1006    Lucrezia Starch, MD 07/10/19 1504

## 2019-07-08 NOTE — ED Notes (Signed)
Pt provided with orange juice and [place] sandwich

## 2019-07-08 NOTE — Discharge Instructions (Addendum)
Please follow-up with your primary care doctor within the week, please continue take Eliquis as you are prescribed.  Please continue to take the Percocet every 6 hours as needed for pain. You may use compression stockings and you may exercise.   It is very important that you eat and drink.  Please eat 3 meals a day.  Please check your blood sugar regularly.

## 2019-07-08 NOTE — ED Notes (Signed)
CBG-69- PA aware

## 2019-07-08 NOTE — ED Notes (Signed)
Pt provided with another cup of OJ. Pt advised to finish sandwich. Will recheck CBG in 30 mins

## 2019-07-08 NOTE — ED Notes (Signed)
ED Provider at bedside. 

## 2019-07-08 NOTE — ED Notes (Addendum)
PA aware of pts CBG

## 2019-07-15 ENCOUNTER — Encounter (HOSPITAL_COMMUNITY): Payer: Self-pay | Admitting: *Deleted

## 2019-07-15 ENCOUNTER — Other Ambulatory Visit: Payer: Self-pay

## 2019-07-15 ENCOUNTER — Emergency Department (HOSPITAL_COMMUNITY)
Admission: EM | Admit: 2019-07-15 | Discharge: 2019-07-15 | Disposition: A | Discharge status: Home / Self Care | Payer: Medicare Other | Source: Home / Self Care | Attending: Emergency Medicine | Admitting: Emergency Medicine

## 2019-07-15 ENCOUNTER — Emergency Department (HOSPITAL_COMMUNITY)
Admission: EM | Admit: 2019-07-15 | Discharge: 2019-07-15 | Disposition: A | Discharge status: Home / Self Care | Payer: Medicare Other | Attending: Emergency Medicine | Admitting: Emergency Medicine

## 2019-07-15 ENCOUNTER — Encounter (HOSPITAL_COMMUNITY): Payer: Self-pay | Admitting: Emergency Medicine

## 2019-07-15 DIAGNOSIS — I824Y1 Acute embolism and thrombosis of unspecified deep veins of right proximal lower extremity: Secondary | ICD-10-CM | POA: Insufficient documentation

## 2019-07-15 DIAGNOSIS — M79604 Pain in right leg: Secondary | ICD-10-CM | POA: Diagnosis present

## 2019-07-15 DIAGNOSIS — Z7984 Long term (current) use of oral hypoglycemic drugs: Secondary | ICD-10-CM | POA: Insufficient documentation

## 2019-07-15 DIAGNOSIS — Z87891 Personal history of nicotine dependence: Secondary | ICD-10-CM | POA: Insufficient documentation

## 2019-07-15 DIAGNOSIS — E119 Type 2 diabetes mellitus without complications: Secondary | ICD-10-CM | POA: Insufficient documentation

## 2019-07-15 DIAGNOSIS — I10 Essential (primary) hypertension: Secondary | ICD-10-CM | POA: Insufficient documentation

## 2019-07-15 DIAGNOSIS — Z7901 Long term (current) use of anticoagulants: Secondary | ICD-10-CM | POA: Insufficient documentation

## 2019-07-15 DIAGNOSIS — Z9049 Acquired absence of other specified parts of digestive tract: Secondary | ICD-10-CM | POA: Insufficient documentation

## 2019-07-15 LAB — COMPREHENSIVE METABOLIC PANEL
ALT: 21 U/L (ref 0–44)
AST: 26 U/L (ref 15–41)
Albumin: 3.8 g/dL (ref 3.5–5.0)
Alkaline Phosphatase: 66 U/L (ref 38–126)
Anion gap: 12 (ref 5–15)
BUN: 18 mg/dL (ref 8–23)
CO2: 20 mmol/L — ABNORMAL LOW (ref 22–32)
Calcium: 9.8 mg/dL (ref 8.9–10.3)
Chloride: 112 mmol/L — ABNORMAL HIGH (ref 98–111)
Creatinine, Ser: 1.17 mg/dL — ABNORMAL HIGH (ref 0.44–1.00)
GFR calc Af Amer: 56 mL/min — ABNORMAL LOW (ref >60)
GFR calc non Af Amer: 49 mL/min — ABNORMAL LOW (ref >60)
Glucose, Bld: 244 mg/dL — ABNORMAL HIGH (ref 70–99)
Potassium: 3.2 mmol/L — ABNORMAL LOW (ref 3.5–5.1)
Sodium: 144 mmol/L (ref 135–145)
Total Bilirubin: 0.8 mg/dL (ref 0.3–1.2)
Total Protein: 6.7 g/dL (ref 6.5–8.1)

## 2019-07-15 LAB — CBC
HCT: 38.3 % (ref 36.0–46.0)
HCT: 39.7 % (ref 36.0–46.0)
Hemoglobin: 10.7 g/dL — ABNORMAL LOW (ref 12.0–15.0)
Hemoglobin: 10.8 g/dL — ABNORMAL LOW (ref 12.0–15.0)
MCH: 22.6 pg — ABNORMAL LOW (ref 26.0–34.0)
MCH: 22.2 pg — ABNORMAL LOW (ref 26.0–34.0)
MCHC: 27.9 g/dL — ABNORMAL LOW (ref 30.0–36.0)
MCHC: 27.2 g/dL — ABNORMAL LOW (ref 30.0–36.0)
MCV: 81 fL (ref 80.0–100.0)
MCV: 81.5 fL (ref 80.0–100.0)
Platelets: 347 10*3/uL (ref 150–400)
Platelets: 323 10*3/uL (ref 150–400)
RBC: 4.73 MIL/uL (ref 3.87–5.11)
RBC: 4.87 MIL/uL (ref 3.87–5.11)
RDW: 27.2 % — ABNORMAL HIGH (ref 11.5–15.5)
RDW: 27.6 % — ABNORMAL HIGH (ref 11.5–15.5)
WBC: 7.9 10*3/uL (ref 4.0–10.5)
WBC: 9.2 10*3/uL (ref 4.0–10.5)
nRBC: 0 % (ref 0.0–0.2)
nRBC: 0 % (ref 0.0–0.2)

## 2019-07-15 LAB — BASIC METABOLIC PANEL
Anion gap: 13 (ref 5–15)
BUN: 12 mg/dL (ref 8–23)
CO2: 20 mmol/L — ABNORMAL LOW (ref 22–32)
Calcium: 10 mg/dL (ref 8.9–10.3)
Chloride: 110 mmol/L (ref 98–111)
Creatinine, Ser: 0.9 mg/dL (ref 0.44–1.00)
GFR calc Af Amer: >60 mL/min (ref >60)
GFR calc non Af Amer: >60 mL/min (ref >60)
Glucose, Bld: 204 mg/dL — ABNORMAL HIGH (ref 70–99)
Potassium: 3.5 mmol/L (ref 3.5–5.1)
Sodium: 143 mmol/L (ref 135–145)

## 2019-07-15 LAB — LIPASE, BLOOD: Lipase: 21 U/L (ref 11–51)

## 2019-07-15 MED ORDER — OXYCODONE HCL 5 MG PO TABS: 5.0000 mg | ORAL_TABLET | ORAL | 0 refills | Status: DC | PRN

## 2019-07-15 MED ORDER — SODIUM CHLORIDE 0.9% FLUSH: 3.0000 mL | Freq: Once | INTRAVENOUS | Status: DC

## 2019-07-15 NOTE — ED Triage Notes (Signed)
Pt states she has not been able to eat and drink since yesterday, nausea and diarrhea. Rt leg pain, DVT dx last week and started on meds.

## 2019-07-15 NOTE — ED Provider Notes (Signed)
[facility] Emergency Department Provider Note MRN:  [id number]  Arrival date & time: 07/15/19     Chief Complaint   Leg pain History of Present Illness   Julie [name] is a 69 y.o. year-old female with a history of DVT presenting to the ED with chief complaint of leg pain.  Patient was diagnosed with a DVT a few days ago.  She has had pain in her right leg for several days, the pain is continued despite starting Eliquis.  She is here for evaluation.  She takes oxycodone at home which helps for a while but it wears off.  She denies fever, no chest pain or shortness of breath, no vomiting, no diarrhea, no nausea, no other complaints.  Review of Systems  A complete 10 system review of systems was obtained and all systems are negative except as noted in the HPI and PMH.   Patient's Health History    Past Medical History:  Diagnosis Date  . Anxiety and depression   . DJD (degenerative joint disease)   . GERD (gastroesophageal reflux disease)   . History of gastric ulcer    w/  bleeding-  05/ 2015  . History of kidney stones   . Hypertension 2011  . Migraines   . Nephrolithiasis    bilateral  . Osteoporosis   . Right ureteral stone   . Type 2 diabetes mellitus ([place])     Past Surgical History:  Procedure Laterality Date  . BIOPSY  07/04/2019   Procedure: BIOPSY;  Surgeon: [name], MD;  Location: [facility];  Service: Gastroenterology;;  . Sharlett Iles  . ESOPHAGOGASTRODUODENOSCOPY N/A 10/01/2013   Procedure: ESOPHAGOGASTRODUODENOSCOPY (EGD);  Surgeon: Milus Banister, MD;  Location: [facility];  Service: Endoscopy;  Laterality: N/A;  . ESOPHAGOGASTRODUODENOSCOPY (EGD) WITH PROPOFOL N/A 07/04/2019   Procedure: ESOPHAGOGASTRODUODENOSCOPY (EGD) WITH PROPOFOL;  Surgeon: [name], MD;  Location: [facility];  Service: Gastroenterology;  Laterality: N/A;  . WISDOM TOOTH EXTRACTION      Family History  Problem Relation Age of Onset  .  Cancer Mother        ovary/uterus cancer  . Hyperlipidemia Mother        M and F  . Heart disease Mother        M had CHF  . Hypertension Mother        Jerilynn Mages and F  . Diabetes Father   . Mental illness Maternal Grandmother   . Hypertension Maternal Grandmother   . Breast cancer Maternal Grandmother   . Mental illness Maternal Grandfather   . Hypertension Maternal Grandfather   . Stomach cancer Maternal Grandfather   . Mental illness Paternal Grandmother   . Hypertension Paternal Grandmother   . Breast cancer Paternal Grandmother   . Mental illness Paternal Grandfather   . Hypertension Paternal Grandfather   . Colon cancer Neg Hx     Social History   Socioeconomic History  . Marital status: Married    Spouse name: keleah hansford  . Number of children: 1  . Years of education: Not on file  . Highest education level: Not on file  Occupational History  . Occupation: not working   Tobacco Use  . Smoking status: Former Smoker    Types: Cigarettes    Quit date: 05/23/1979    Years since quitting: 40.1  . Smokeless tobacco: Never Used  Substance and Sexual Activity  . Alcohol use: No    Comment: ocass  .  Drug use: No  . Sexual activity: Never  Other Topics Concern  . Not on file  Social History Narrative   Husband had a stroke 4 years ago.  Daughter moved in to help them out.   Social Determinants of Health   Financial Resource Strain:   . Difficulty of Paying Living Expenses: Not on file  Food Insecurity:   . Worried About Charity fundraiser in the Last Year: Not on file  . Ran Out of Food in the Last Year: Not on file  Transportation Needs:   . Lack of Transportation (Medical): Not on file  . Lack of Transportation (Non-Medical): Not on file  Physical Activity:   . Days of Exercise per Week: Not on file  . Minutes of Exercise per Session: Not on file  Stress:   . Feeling of Stress : Not on file  Social Connections:   . Frequency of Communication with Friends and  Family: Not on file  . Frequency of Social Gatherings with Friends and Family: Not on file  . Attends Religious Services: Not on file  . Active Member of Clubs or Organizations: Not on file  . Attends Archivist Meetings: Not on file  . Marital Status: Not on file  Intimate Partner Violence:   . Fear of Current or Ex-Partner: Not on file  . Emotionally Abused: Not on file  . Physically Abused: Not on file  . Sexually Abused: Not on file     Physical Exam   Vitals:   07/15/19 1839 07/15/19 2200  BP: 119/78 139/77  Pulse: (!) 105 (!) 115  Resp: 16 18  Temp:    SpO2: 100% 99%    CONSTITUTIONAL: Well-appearing, NAD NEURO:  Alert and oriented x 3, no focal deficits EYES:  eyes equal and reactive ENT/NECK:  no LAD, no JVD CARDIO: Regular rate, well-perfused, normal S1 and S2 PULM:  CTAB no wheezing or rhonchi GI/GU:  normal bowel sounds, non-distended, non-tender MSK/SPINE:  No gross deformities, no edema SKIN:  no rash, atraumatic PSYCH:  Appropriate speech and behavior  *Additional and/or pertinent findings included in MDM below  Diagnostic and Interventional Summary    EKG Interpretation  Date/Time:    Ventricular Rate:    PR Interval:    QRS Duration:   QT Interval:    QTC Calculation:   R Axis:     Text Interpretation:        Cardiac Monitoring Interpretation:  Labs Reviewed  COMPREHENSIVE METABOLIC PANEL - Abnormal; Notable for the following components:      Result Value   Potassium 3.2 (*)    Chloride 112 (*)    CO2 20 (*)    Glucose, Bld 244 (*)    Creatinine, Ser 1.17 (*)    GFR calc non Af Amer 49 (*)    GFR calc Af Amer 56 (*)    All other components within normal limits  CBC - Abnormal; Notable for the following components:   Hemoglobin 10.8 (*)    MCH 22.2 (*)    MCHC 27.2 (*)    RDW 27.6 (*)    All other components within normal limits  LIPASE, BLOOD  URINALYSIS, ROUTINE W REFLEX MICROSCOPIC    No orders to display     Medications  sodium chloride flush (NS) 0.9 % injection 3 mL (0 mLs Intravenous Hold 07/15/19 2159)     Procedures  /  Critical Care Procedures  ED Course and Medical Decision Making  I  have reviewed the triage vital signs, the nursing notes, and pertinent available records from the EMR.  Pertinent labs & imaging results that were available during my care of the patient were reviewed by me and considered in my medical decision making (see below for details).     It was documented initially that patient was presenting with nausea and diarrhea, however she denies any of these symptoms.  She is here for continued right leg pain in the setting of known DVT.  She has been taking Eliquis.  Oxycodone is helping but is not taking away the pain completely and it is not keeping the pain away.  She has strong DP pulses bilaterally, they are equal, she has normal cap refill, there is no significant swelling, there is no redness, there is no sign of infection, she has had no trauma, her pain seems to be well explained by the known DVT.  She has no chest pain or shortness of breath, she has no other complaints.  She is running low on her pain pills, will prescribe a few more, appropriate for discharge.    Barth Kirks. Sedonia Small, [place] [email or web address]  Final Clinical Impressions(s) / ED Diagnoses     ICD-10-CM   1. Deep vein thrombosis (DVT) of proximal vein of right lower extremity, unspecified chronicity (HCC)  I82.4Y1     ED Discharge Orders         Ordered    oxyCODONE (ROXICODONE) 5 MG immediate release tablet  Every 4 hours PRN     07/15/19 2250           Discharge Instructions Discussed with and Provided to Patient:     Discharge Instructions     You were evaluated in the Emergency Department and after careful evaluation, we did not find any emergent condition requiring admission or further testing in the hospital.  Your  exam/testing today is overall reassuring.  Your pain seems to be related to the DVT.  Please continue taking the blood thinner and use the pain medication provided as needed.  Please return to the Emergency Department if you experience any worsening of your condition.  We encourage you to follow up with a primary care provider.  Thank you for allowing [place] to be a part of your care.      Maudie Flakes, MD 07/15/19 2252

## 2019-07-15 NOTE — ED Triage Notes (Signed)
Patient states she was seen at [facility] last Monday and has blood clot in right leg. Was sent here from PCP stating the clot is not dissolving correctly and having pain. reports taking blood thinner as prescribed.

## 2019-07-15 NOTE — Discharge Instructions (Addendum)
You were evaluated in the Emergency Department and after careful evaluation, we did not find any emergent condition requiring admission or further testing in the hospital.  Your exam/testing today is overall reassuring.  Your pain seems to be related to the DVT.  Please continue taking the blood thinner and use the pain medication provided as needed.  Please return to the Emergency Department if you experience any worsening of your condition.  We encourage you to follow up with a primary care provider.  Thank you for allowing [place] to be a part of your care.

## 2019-07-15 NOTE — ED Notes (Signed)
RN called by Network engineer stating patient is at [facility] to be seen.

## 2019-07-15 NOTE — ED Notes (Signed)
Patient has a extra gold top in the main lab

## 2019-08-05 ENCOUNTER — Ambulatory Visit: Payer: Medicare Other | Admitting: Gastroenterology

## 2019-08-29 ENCOUNTER — Other Ambulatory Visit (HOSPITAL_COMMUNITY): Payer: Self-pay | Admitting: Surgery

## 2019-08-29 DIAGNOSIS — R52 Pain, unspecified: Secondary | ICD-10-CM

## 2019-09-03 ENCOUNTER — Other Ambulatory Visit: Payer: Self-pay

## 2019-09-03 ENCOUNTER — Ambulatory Visit (HOSPITAL_COMMUNITY)
Admission: RE | Admit: 2019-09-03 | Discharge: 2019-09-03 | Disposition: A | Discharge status: Home / Self Care | Payer: Medicare Other | Source: Ambulatory Visit | Attending: Surgery | Admitting: Surgery

## 2019-09-03 DIAGNOSIS — R936 Abnormal findings on diagnostic imaging of limbs: Secondary | ICD-10-CM | POA: Diagnosis not present

## 2019-09-03 DIAGNOSIS — R52 Pain, unspecified: Secondary | ICD-10-CM | POA: Insufficient documentation

## 2019-09-03 NOTE — Progress Notes (Signed)
Bilateral lower extremity venous duplex completed. Refer to "CV Proc" under chart review to view preliminary results.  09/03/2019 10:57 AM Kelby Aline., MHA, RVT, RDCS, RDMS

## 2019-09-11 ENCOUNTER — Encounter: Payer: Self-pay | Admitting: Neurology

## 2019-09-11 ENCOUNTER — Other Ambulatory Visit: Payer: Self-pay

## 2019-09-11 ENCOUNTER — Ambulatory Visit (INDEPENDENT_AMBULATORY_CARE_PROVIDER_SITE_OTHER): Payer: Medicare Other | Admitting: Neurology

## 2019-09-11 VITALS — BP 138/81 | HR 99 | Temp 96.9°F | Ht 66.0 in | Wt 152.3 lb

## 2019-09-11 DIAGNOSIS — R419 Unspecified symptoms and signs involving cognitive functions and awareness: Secondary | ICD-10-CM

## 2019-09-11 DIAGNOSIS — Z8669 Personal history of other diseases of the nervous system and sense organs: Secondary | ICD-10-CM | POA: Diagnosis not present

## 2019-09-11 NOTE — Progress Notes (Signed)
Subjective:    Patient ID: Julie [name] is a 69 y.o. female.  HPI     Star Age, MD, PhD [facility] [address], [address] [place], [place] [address]  Dear Dr. Forde Dandy,   I saw your patient, Julie [name], upon your kind request to my neurologic clinic today for initial consultation of her memory loss.  The patient is unaccompanied today.  As you know, Julie [name] is a 69 year old right-handed woman with an underlying medical history of anxiety, depression, migraine headaches, hypertension, kidney stones, osteoporosis, degenerative joint disease, reflux disease, type 2 diabetes, history of peptic ulcer disease with remote history of bleeding gastric ulcer, and left leg DVT recently, who reports recent confusion when she was hospitalized. Her daughter provides additional information and reports that patient was not making any sense and she had partial amnesia about what happened in the hospital. She had encephalopathy and had multiple issues leading up to it. She was not eating and drinking very well, she was found to be severely anemic and dehydrated and had metabolic changes. She improved while she was in the hospital. Daughter reports that patient had severe pain. She was also treated with pain medication in the hospital and continues to take narcotic pain medication, Percocet 5 mg strength as needed, typically once or twice daily. Her daughter also reports that patient had so much pain that daughter gave her marijuana, she smoked it once a week or so. She continues to use it sparingly. She drinks alcohol very rarely and none recently. She drinks very little water, maybe three or four small bottles per day, 8 ounces each. She has had some improvement in her appetite. Overall, she had lost quite a bit of weight since February, altogether 25 pounds but is gaining weight back. She has a family history of Alzheimer's dementia affecting her father. She is the middle child  of three, she has a younger brother and lost her older brother at the age of 23 from likely heart disease. Daughter lives with patient and her husband, patient has one child, she sleeps fairly well currently. Confusion has vastly improved. She quit smoking some 20+ years ago. She has a remote history of migraines, none recently. She was recently switched from Eliquis to Xarelto due to cost of the Eliquis.  I reviewed your office note from 07/24/2019.  She had several blood tests through your office at the time and I reviewed the results: Iron and TIBC were unremarkable, vitamin B12 707, factor V Leiden negative, BMP showed blood sugar of 157, BUN 17, creatinine 1.1, otherwise unremarkable, CBC with differential showed WBC mildly elevated at 11, hemoglobin mildly low at 11.1, hematocrit 38.1, MCH mildly reduced at 24.1, MCHC mildly reduced at 29, normal platelets at 307, liver enzymes were unremarkable, TSH normal at 1.85.  Of note, she was recently hospitalized from 07/01/2019 through 07/05/2019 secondary to leg pain and altered mental status.  Work-up revealed right leg DVT.  She was felt to have acute metabolic encephalopathy secondary to hypernatremia, dehydration, and medication effect. She was found to be anemic. She received a transfusion and also iron infusion and magnesium was replaced as well.  She had a head CT without contrast on 07/01/2019 and I reviewed the results: IMPRESSION: 1. Age-indeterminate small vessel ischemic changes within the bilateral frontal periventricular white matter and basal ganglia, favor chronic. 2. Otherwise unremarkable exam.    She had a brain MRI without contrast on 07/01/2019 and I reviewed the  results:  IMPRESSION: 1. No acute intracranial abnormality. 2. Findings of chronic ischemic microangiopathy. 3. Mild-to-moderate narrowing of the upper cervical spinal canal.   Her Past Medical History Is Significant For: Past Medical History:  Diagnosis Date  . Anxiety  and depression   . DJD (degenerative joint disease)   . GERD (gastroesophageal reflux disease)   . History of gastric ulcer    w/  bleeding-  05/ 2015  . History of kidney stones   . Hypertension 2011  . Migraines   . Nephrolithiasis    bilateral  . Osteoporosis   . Right ureteral stone   . Type 2 diabetes mellitus ([place])     Her Past Surgical History Is Significant For: Past Surgical History:  Procedure Laterality Date  . BIOPSY  07/04/2019   Procedure: BIOPSY;  Surgeon: [name], MD;  Location: [facility];  Service: Gastroenterology;;  . Sharlett Iles  . ESOPHAGOGASTRODUODENOSCOPY N/A 10/01/2013   Procedure: ESOPHAGOGASTRODUODENOSCOPY (EGD);  Surgeon: Milus Banister, MD;  Location: [facility];  Service: Endoscopy;  Laterality: N/A;  . ESOPHAGOGASTRODUODENOSCOPY (EGD) WITH PROPOFOL N/A 07/04/2019   Procedure: ESOPHAGOGASTRODUODENOSCOPY (EGD) WITH PROPOFOL;  Surgeon: [name], MD;  Location: [facility];  Service: Gastroenterology;  Laterality: N/A;  . WISDOM TOOTH EXTRACTION      Her Family History Is Significant For: Family History  Problem Relation Age of Onset  . Cancer Mother        ovary/uterus cancer  . Hyperlipidemia Mother        M and F  . Heart disease Mother        M had CHF  . Hypertension Mother        Jerilynn Mages and F  . Diabetes Father   . Mental illness Maternal Grandmother   . Hypertension Maternal Grandmother   . Breast cancer Maternal Grandmother   . Mental illness Maternal Grandfather   . Hypertension Maternal Grandfather   . Stomach cancer Maternal Grandfather   . Mental illness Paternal Grandmother   . Hypertension Paternal Grandmother   . Breast cancer Paternal Grandmother   . Mental illness Paternal Grandfather   . Hypertension Paternal Grandfather   . Colon cancer Neg Hx     Her Social History Is Significant For: Social History   Socioeconomic History  . Marital status: Married    Spouse name: rien mcgurl  . Number of  children: 1  . Years of education: Not on file  . Highest education level: Not on file  Occupational History  . Occupation: not working   Tobacco Use  . Smoking status: Former Smoker    Types: Cigarettes    Quit date: 05/23/1979    Years since quitting: 40.3  . Smokeless tobacco: Never Used  Substance and Sexual Activity  . Alcohol use: No    Comment: ocass  . Drug use: No  . Sexual activity: Never  Other Topics Concern  . Not on file  Social History Narrative   Husband had a stroke 4 years ago.  Daughter moved in to help them out.   Social Determinants of Health   Financial Resource Strain:   . Difficulty of Paying Living Expenses:   Food Insecurity:   . Worried About Charity fundraiser in the Last Year:   . Arboriculturist in the Last Year:   Transportation Needs:   . Film/video editor (Medical):   Marland Kitchen Lack of Transportation (Non-Medical):   Physical Activity:   . Days  of Exercise per Week:   . Minutes of Exercise per Session:   Stress:   . Feeling of Stress :   Social Connections:   . Frequency of Communication with Friends and Family:   . Frequency of Social Gatherings with Friends and Family:   . Attends Religious Services:   . Active Member of Clubs or Organizations:   . Attends Archivist Meetings:   Marland Kitchen Marital Status:     Her Allergies Are:  No Known Allergies:   Her Current Medications Are:  Outpatient Encounter Medications as of 09/11/2019  Medication Sig  . acetaminophen (TYLENOL) 500 MG tablet Take 500 mg by mouth every 6 (six) hours as needed for moderate pain or headache.   . ALPRAZolam (XANAX) 0.25 MG tablet Take 2 tablets (0.5 mg total) by mouth at bedtime as needed for anxiety.  Marland Kitchen apixaban (ELIQUIS) 5 MG TABS tablet Take 1 tablet (5 mg total) by mouth 2 (two) times daily.  Marland Kitchen atorvastatin (LIPITOR) 10 MG tablet Take 1 tablet (10 mg total) by mouth daily.  Marland Kitchen diltiazem (CARTIA XT) 240 MG 24 hr capsule Take 1 capsule (240 mg total) by  mouth daily.  . fenofibrate micronized (LOFIBRA) 200 MG capsule Take 1 capsule (200 mg total) by mouth daily before breakfast.  . ferrous sulfate 325 (65 FE) MG tablet Take 1 tablet (325 mg total) by mouth 2 (two) times daily with a meal.  . glimepiride (AMARYL) 4 MG tablet Take 1.5 tablets (6 mg total) by mouth daily with breakfast.  . losartan (COZAAR) 50 MG tablet Take 25 mg by mouth daily.   . magnesium oxide (MAG-OX) 400 (241.3 Mg) MG tablet Take 1 tablet (400 mg total) by mouth 2 (two) times daily.  . metFORMIN (GLUCOPHAGE) 1000 MG tablet Take 1 tablet (1,000 mg total) by mouth 2 (two) times daily with a meal.  . oxyCODONE (ROXICODONE) 5 MG immediate release tablet Take 1 tablet (5 mg total) by mouth every 4 (four) hours as needed for severe pain.  Marland Kitchen oxyCODONE-acetaminophen (PERCOCET/ROXICET) 5-325 MG tablet Take 1 tablet by mouth 4 (four) times daily as needed for moderate pain.   . pantoprazole (PROTONIX) 40 MG tablet 40 mg twice daily for one month followed by 40 mg daily indefinitely. (Patient taking differently: Take 40 mg by mouth daily. )  . PARoxetine (PAXIL) 40 MG tablet Take 1 tablet (40 mg total) by mouth daily.  . rivaroxaban (XARELTO) 20 MG TABS tablet Take 20 mg by mouth daily with supper.  . [DISCONTINUED] apixaban (ELIQUIS) 5 MG TABS tablet Take 2 tablets (10 mg total) by mouth 2 (two) times daily for 5 days. (Patient not taking: Reported on 07/15/2019)   No facility-administered encounter medications on file as of 09/11/2019.  :   Review of Systems:  Out of a complete 14 point review of systems, all are reviewed and negative with the exception of these symptoms as listed below:  Review of Systems  Neurological:       Here for evaluation on memory.  Pt reports in February she was in the [facility] for blood clots and was confused during her stay.  Pt/daughet reports memory is stable now but her father had Alzheimer's and wanted to come for evaluation.     Objective:   Neurological Exam  Physical Exam Physical Examination:   Vitals:   09/11/19 1423  BP: 138/81  Pulse: 99  Temp: (!) 96.9 F (36.1 C)   General Examination: The patient is a  very pleasant 69 y.o. female in no acute distress. She appears well-developed and well-nourished and well groomed.   HEENT: Normocephalic, atraumatic, pupils are equal, round and reactive to light. Corrective eyeglasses in place. Extraocular tracking is well preserved, hearing grossly intact, face is symmetric with normal facial animation. No carotid bruits, airway examination reveals moderate mouth dryness, adequate dental hygiene. Tongue protrudes centrally and palate elevates symmetrically.   Chest: Clear to auscultation without wheezing, rhonchi or crackles noted.  Heart: S1+S2+0, regular and normal without murmurs, rubs or gallops noted.   Abdomen: Soft, non-tender and non-distended with normal bowel sounds appreciated on auscultation.  Extremities: There is no pitting edema in the distal lower extremities bilaterally. Pedal pulses are intact.  Skin: Warm and dry without trophic changes noted.  Musculoskeletal: exam reveals R leg pain.   Neurologically:  Mental status: The patient is awake, alert and oriented in all 4 spheres. Her immediate and remote memory, attention, language skills and fund of knowledge are appropriate. There is no evidence of aphasia, agnosia, apraxia or anomia. Speech is clear with normal prosody and enunciation. Thought process is linear. Mood is normal and affect is normal.  On 09/11/2019: MMSE: 30/30, CDT: 4/4, AFT: 8/min. Cranial nerves II - XII are as described above under HEENT exam. In addition: shoulder shrug is normal with equal shoulder height noted. Motor exam: Normal bulk, strength and tone is noted. There is no drift, tremor or rebound. Reflexes are 1+ throughout. Babinski: Toes are flexor bilaterally. Fine motor skills and coordination: intact with normal finger taps,  normal hand movements, normal rapid alternating patting, normal foot taps and normal foot agility.  Cerebellar testing: No dysmetria or intention tremor on finger to nose testing. Heel to shin is unremarkable bilaterally. There is no truncal or gait ataxia.  Sensory exam: intact to light touch, vibration, temperature sense in the upper and lower extremities.  Gait, station and balance: She stands with mild difficulty, she walks with a three-point cane, a limp on the right.   Assessment and plan:   Assessment and Plan:  In summary, Julie [name] is a very pleasant 69 y.o.-year old female  with an underlying medical history of anxiety, depression, migraine headaches, hypertension, kidney stones, osteoporosis, degenerative joint disease, reflux disease, type 2 diabetes, history of peptic ulcer disease with remote history of bleeding gastric ulcer, and left leg DVT recently, who presents for evaluation of her cognitive function, memory loss and recent altered mental status.  She had encephalopathy in the context of acute illness including dehydration, anemia, acute DVT, and probable medication side effects, poor nutrition.  She reports a family history of Alzheimer's dementia.  Her memory function and confusion have greatly improved since her recent hospitalization.  She has better appetite.  She does not always hydrate well.  Memory scores of benign.  Her recent brain MRI did not show any significant atrophy in keeping with Alzheimer's type changes.  She had chronic vascular changes.  We can continue to monitor her memory function and consider a repeat brain MRI down the road.  We can consider a more in-depth cognitive evaluation with the help of neuropsychology but we mutually agreed to wait things out.  She is cautioned regarding the use of narcotic pain medication.  She is furthermore advised to abstain from smoking marijuana.  She is advised to stay better hydrated with water.  We talked about the  importance of nutrition.  Maybe she can supplement with something like Glucerna for additional nutritional support.  She had recent work-up including additional blood test through your office, I do not believe she needs any additional work-up quite yet from my end of things.  She is advised to follow-up routinely in this office in about 6 months, sooner if needed.  I answered all her questions today and the patient and her daughter were in agreement.  Thank you very much for allowing me to participate in the care of this nice patient. If I can be of any further assistance to you please do not hesitate to call me at [phone].  Sincerely,   Star Age, MD, PhD

## 2019-09-11 NOTE — Patient Instructions (Addendum)
You have complaints of cognitive changes. I think these happened in the context of acute illness, medication side effect, severe anemia, dehydration recently.   I am glad to hear that you are on the mend and feeling better. We will continue to monitor your symptoms. I do not believe that you need a brain MRI to be repeated quite yet. There were some chronic changes on the brain scan but nothing acute.   Please continue to monitor your caloric intake and try to supplement with something like Glucerna. Please try to hydrate better with water, 4-6 small bottles per day would be better.  Please be cautious with the use of narcotic pain medication as these can affect your mental state and cognitive alertness. Please refrain from smoking marijuana. Avoid alcohol. Please try to rest well and try to achieve 7 to 8 hours of sleep.  We will monitor your exam and follow-up routinely in 6 months, sooner if needed.  Please follow up with your primary care physician as scheduled.

## 2019-09-15 ENCOUNTER — Other Ambulatory Visit: Payer: Self-pay | Admitting: Surgery

## 2019-09-15 DIAGNOSIS — M79605 Pain in left leg: Secondary | ICD-10-CM

## 2019-11-03 ENCOUNTER — Ambulatory Visit
Admission: RE | Admit: 2019-11-03 | Discharge: 2019-11-03 | Disposition: A | Discharge status: Home / Self Care | Payer: Medicare Other | Source: Ambulatory Visit | Attending: Surgery | Admitting: Surgery

## 2019-11-03 ENCOUNTER — Other Ambulatory Visit: Payer: Self-pay

## 2019-11-03 DIAGNOSIS — M79605 Pain in left leg: Secondary | ICD-10-CM

## 2019-11-03 MED ORDER — IOPAMIDOL (ISOVUE-300) INJECTION 61%
100.0000 mL | Freq: Once | INTRAVENOUS | Status: AC | PRN
  Administered 2019-11-03: 100 mL via INTRAVENOUS

## 2019-11-26 ENCOUNTER — Other Ambulatory Visit: Payer: Self-pay

## 2019-12-01 ENCOUNTER — Telehealth: Payer: Self-pay | Admitting: Family Medicine

## 2019-12-01 NOTE — Telephone Encounter (Signed)
Patient's daughter called to make her a appointment. Is appear that she has been d/c'd from our department.

## 2020-01-06 ENCOUNTER — Other Ambulatory Visit: Payer: Self-pay

## 2020-01-06 ENCOUNTER — Other Ambulatory Visit: Payer: Self-pay | Admitting: Family Medicine

## 2020-01-06 ENCOUNTER — Ambulatory Visit
Admission: RE | Admit: 2020-01-06 | Discharge: 2020-01-06 | Disposition: A | Discharge status: Home / Self Care | Payer: Medicare Other | Source: Ambulatory Visit | Attending: Family Medicine | Admitting: Family Medicine

## 2020-01-06 DIAGNOSIS — M79601 Pain in right arm: Secondary | ICD-10-CM

## 2020-02-04 ENCOUNTER — Other Ambulatory Visit: Payer: Self-pay | Admitting: Family Medicine

## 2020-02-04 DIAGNOSIS — Z1231 Encounter for screening mammogram for malignant neoplasm of breast: Secondary | ICD-10-CM

## 2020-02-06 ENCOUNTER — Other Ambulatory Visit: Payer: Self-pay | Admitting: Family Medicine

## 2020-02-06 DIAGNOSIS — E2839 Other primary ovarian failure: Secondary | ICD-10-CM

## 2020-02-16 ENCOUNTER — Other Ambulatory Visit: Payer: Self-pay | Admitting: Family Medicine

## 2020-02-17 ENCOUNTER — Other Ambulatory Visit: Payer: Self-pay | Admitting: Family Medicine

## 2020-02-17 DIAGNOSIS — R1902 Left upper quadrant abdominal swelling, mass and lump: Secondary | ICD-10-CM

## 2020-03-15 ENCOUNTER — Ambulatory Visit: Payer: Medicare Other | Admitting: Neurology

## 2020-03-18 ENCOUNTER — Ambulatory Visit: Payer: Medicare Other | Admitting: Neurology

## 2020-03-24 ENCOUNTER — Other Ambulatory Visit: Payer: Self-pay

## 2020-03-24 ENCOUNTER — Ambulatory Visit (INDEPENDENT_AMBULATORY_CARE_PROVIDER_SITE_OTHER): Payer: Medicare Other | Admitting: Neurology

## 2020-03-24 ENCOUNTER — Encounter: Payer: Self-pay | Admitting: Neurology

## 2020-03-24 VITALS — BP 157/82 | HR 91 | Wt 160.0 lb

## 2020-03-24 DIAGNOSIS — R413 Other amnesia: Secondary | ICD-10-CM | POA: Diagnosis not present

## 2020-03-24 DIAGNOSIS — R419 Unspecified symptoms and signs involving cognitive functions and awareness: Secondary | ICD-10-CM

## 2020-03-24 NOTE — Progress Notes (Signed)
Subjective:    [name] ID: Julie [name] is a 69 y.o. female.  HPI     Interim history:  Julie [name] is a 69 year old right-handed woman with an underlying medical history of anxiety, depression, migraine headaches, hypertension, kidney stones, osteoporosis, degenerative joint disease, reflux disease, type 2 diabetes, history of peptic ulcer disease with remote history of bleeding gastric ulcer, and left leg DVT recently, who Presents for follow-up consultation of Julie [name] memory loss.  Julie [name] is unaccompanied today.  I first met Julie [name] at Julie request of Julie [name] primary care physician on 09/11/2019, at which time Julie [name] reported recent confusion and short-term memory issues when Julie [name] was hospitalized.  Julie [name] had significant acute medical issues at Julie time, was felt to have encephalopathy.  Julie [name] had significant anemia, dehydration and other metabolic changes.  Julie [name] improved while Julie [name] was in Julie hospital and after as well.  Julie [name] MMSE on 09/11/2019 was 30 out of 30, clock drawing 4 out of 4 and animal fluency 8/min.  Julie [name] has had recent blood work and an MRI of Julie brain in February.  Julie [name] was advised to follow-up for a recheck.  Julie [name] also reported a family history of Alzheimer's dementia.   Today, 03/24/20: Julie [name] reports feeling fairly stable with regards to Julie [name] memory.  Julie [name] has difficulty remembering names but no difficulty remembering faces.  Julie [name] also reports that Julie [name] has never been good with remembering people's names.  Julie [name] works in Julie [name] and is on Julie [name] feet a lot.  Since Julie [name] DVT, Julie [name] has had leg pains, Julie [name] first started having foot pain and is now having pains in Julie [name] legs off and on.  Julie [name] is taking hydrocodone per primary care physician, Dr. Mannie Stabile.  Julie [name] started seeing Julie new primary care about 5 months ago and has a whole list of things that Julie [name] is supposed to be doing including seeing an eye doctor, improving Julie [name] diabetes care.  Julie [name] reports that Julie [name] last A1c was elevated.  Julie [name] is also on Novolin.  Julie [name] is  on Xarelto or Eliquis, Julie [name] is not sure which when Julie [name] is actually taking.  Julie [name]'s allergies, current medications, family history, past medical history, past social history, past surgical history and problem list were reviewed and updated as appropriate.   Previously:   09/11/19: (Julie [name]) reports recent confusion when Julie [name] was hospitalized. Julie [name] daughter provides additional information and reports that [name] was not making any sense and Julie [name] had partial amnesia about what happened in Julie hospital. Julie [name] had encephalopathy and had multiple issues leading up to it. Julie [name] was not eating and drinking very well, Julie [name] was found to be severely anemic and dehydrated and had metabolic changes. Julie [name] improved while Julie [name] was in Julie hospital. Daughter reports that [name] had severe pain. Julie [name] was also treated with pain medication in Julie hospital and continues to take narcotic pain medication, Percocet 5 mg strength as needed, typically once or twice daily. Julie [name] daughter also reports that [name] had so much pain that daughter gave Julie [name] marijuana, Julie [name] smoked it once a week or so. Julie [name] continues to use it sparingly. Julie [name] drinks alcohol very rarely and none recently. Julie [name] drinks very little water, maybe three or four small bottles per day, 8 ounces each. Julie [name] has had some improvement in Julie [name] appetite. Overall, Julie [name] had lost quite a bit of weight since February, altogether 25 pounds but is gaining weight back. Julie [name] has a family history of Alzheimer's dementia affecting Julie [name] father. Julie [name] is Julie middle child  of three, Julie [name] has a younger brother and lost Julie [name] older brother at Julie age of 55 from likely heart disease. Daughter lives with [name] and Julie [name] husband, [name] has one child, Julie [name] sleeps fairly well currently. Confusion has vastly improved. Julie [name] quit smoking some 20+ years ago. Julie [name] has a remote history of migraines, none recently. Julie [name] was recently switched from Eliquis to Xarelto due to cost of Julie Eliquis.   I reviewed your office note from  07/24/2019.  Julie [name] had several blood tests through your office at Julie time and I reviewed Julie results: Iron and TIBC were unremarkable, vitamin B12 707, factor V Leiden negative, BMP showed blood sugar of 157, BUN 17, creatinine 1.1, otherwise unremarkable, CBC with differential showed WBC mildly elevated at 11, hemoglobin mildly low at 11.1, hematocrit 38.1, MCH mildly reduced at 24.1, MCHC mildly reduced at 29, normal platelets at 307, liver enzymes were unremarkable, TSH normal at 1.85.   Of note, Julie [name] was recently hospitalized from 07/01/2019 through 07/05/2019 secondary to leg pain and altered mental status.  Work-up revealed right leg DVT.  Julie [name] was felt to have acute metabolic encephalopathy secondary to hypernatremia, dehydration, and medication effect. Julie [name] was found to be anemic. Julie [name] received a transfusion and also iron infusion and magnesium was replaced as well.   Julie [name] had a head CT without contrast on 07/01/2019 and I reviewed Julie results: IMPRESSION: 1. Age-indeterminate small vessel ischemic changes within Julie bilateral frontal periventricular white matter and basal ganglia, favor chronic. 2. Otherwise unremarkable exam.     Julie [name] had a brain MRI without contrast on 07/01/2019 and I reviewed Julie results:   IMPRESSION: 1. No acute intracranial abnormality. 2. Findings of chronic ischemic microangiopathy. 3. Mild-to-moderate narrowing of Julie upper cervical spinal canal.  Julie [name] Past Medical History Is Significant For: Past Medical History:  Diagnosis Date  . Anxiety and depression   . DJD (degenerative joint disease)   . GERD (gastroesophageal reflux disease)   . History of gastric ulcer    w/  bleeding-  05/ 2015  . History of kidney stones   . Hypertension 2011  . Migraines   . Nephrolithiasis    bilateral  . Osteoporosis   . Right ureteral stone   . Type 2 diabetes mellitus ([place])     Julie [name] Past Surgical History Is Significant For: Past Surgical History:  Procedure Laterality Date  .  BIOPSY  07/04/2019   Procedure: BIOPSY;  Surgeon: [name], MD;  Location: [facility];  Service: Gastroenterology;;  . Sharlett Iles  . ESOPHAGOGASTRODUODENOSCOPY N/A 10/01/2013   Procedure: ESOPHAGOGASTRODUODENOSCOPY (EGD);  Surgeon: Milus Banister, MD;  Location: [facility];  Service: Endoscopy;  Laterality: N/A;  . ESOPHAGOGASTRODUODENOSCOPY (EGD) WITH PROPOFOL N/A 07/04/2019   Procedure: ESOPHAGOGASTRODUODENOSCOPY (EGD) WITH PROPOFOL;  Surgeon: [name], MD;  Location: [facility];  Service: Gastroenterology;  Laterality: N/A;  . WISDOM TOOTH EXTRACTION      Julie [name] Family History Is Significant For: Family History  Problem Relation Age of Onset  . Cancer Mother        ovary/uterus cancer  . Hyperlipidemia Mother        M and F  . Heart disease Mother        M had CHF  . Hypertension Mother        Jerilynn Mages and F  . Diabetes Father   . Mental illness Maternal Grandmother   . Hypertension Maternal Grandmother   . Breast cancer Maternal Grandmother   .  Mental illness Maternal Grandfather   . Hypertension Maternal Grandfather   . Stomach cancer Maternal Grandfather   . Mental illness Paternal Grandmother   . Hypertension Paternal Grandmother   . Breast cancer Paternal Grandmother   . Mental illness Paternal Grandfather   . Hypertension Paternal Grandfather   . Colon cancer Neg Hx     Julie [name] Social History Is Significant For: Social History   Socioeconomic History  . Marital status: Married    Spouse name: elisheva fallas  . Number of children: 1  . Years of education: Not on file  . Highest education level: Not on file  Occupational History  . Occupation: not working   Tobacco Use  . Smoking status: Former Smoker    Types: Cigarettes    Quit date: 05/23/1979    Years since quitting: 40.8  . Smokeless tobacco: Never Used  Substance and Sexual Activity  . Alcohol use: No    Comment: ocass  . Drug use: No  . Sexual activity: Never  Other Topics Concern  .  Not on file  Social History Narrative   Husband had a stroke 4 years ago.  Daughter moved in to help them out.   Social Determinants of Health   Financial Resource Strain:   . Difficulty of Paying Living Expenses: Not on file  Food Insecurity:   . Worried About Charity fundraiser in Julie Last Year: Not on file  . Ran Out of Food in Julie Last Year: Not on file  Transportation Needs:   . Lack of Transportation (Medical): Not on file  . Lack of Transportation (Non-Medical): Not on file  Physical Activity:   . Days of Exercise per Week: Not on file  . Minutes of Exercise per Session: Not on file  Stress:   . Feeling of Stress : Not on file  Social Connections:   . Frequency of Communication with Friends and Family: Not on file  . Frequency of Social Gatherings with Friends and Family: Not on file  . Attends Religious Services: Not on file  . Active Member of Clubs or Organizations: Not on file  . Attends Archivist Meetings: Not on file  . Marital Status: Not on file    Julie [name] Allergies Are:  No Known Allergies:   Julie [name] Current Medications Are:  Outpatient Encounter Medications as of 03/24/2020  Medication Sig  . acetaminophen (TYLENOL) 500 MG tablet Take 500 mg by mouth every 6 (six) hours as needed for moderate pain or headache.   . ALPRAZolam (XANAX) 0.25 MG tablet Take 2 tablets (0.5 mg total) by mouth at bedtime as needed for anxiety.  Marland Kitchen apixaban (ELIQUIS) 5 MG TABS tablet Take 1 tablet (5 mg total) by mouth 2 (two) times daily.  Marland Kitchen atorvastatin (LIPITOR) 10 MG tablet Take 1 tablet (10 mg total) by mouth daily.  Marland Kitchen diltiazem (CARTIA XT) 240 MG 24 hr capsule Take 1 capsule (240 mg total) by mouth daily.  . fenofibrate micronized (LOFIBRA) 200 MG capsule Take 1 capsule (200 mg total) by mouth daily before breakfast.  . ferrous sulfate 325 (65 FE) MG tablet Take 1 tablet (325 mg total) by mouth 2 (two) times daily with a meal.  . glimepiride (AMARYL) 4 MG tablet Take 1.5  tablets (6 mg total) by mouth daily with breakfast.  . losartan (COZAAR) 50 MG tablet Take 25 mg by mouth daily.   . magnesium oxide (MAG-OX) 400 (241.3 Mg) MG tablet Take 1 tablet (400 mg total)  by mouth 2 (two) times daily.  . metFORMIN (GLUCOPHAGE) 1000 MG tablet Take 1 tablet (1,000 mg total) by mouth 2 (two) times daily with a meal.  . oxyCODONE (ROXICODONE) 5 MG immediate release tablet Take 1 tablet (5 mg total) by mouth every 4 (four) hours as needed for severe pain.  Marland Kitchen oxyCODONE-acetaminophen (PERCOCET/ROXICET) 5-325 MG tablet Take 1 tablet by mouth 4 (four) times daily as needed for moderate pain.   . pantoprazole (PROTONIX) 40 MG tablet 40 mg twice daily for one month followed by 40 mg daily indefinitely. ([name] taking differently: Take 40 mg by mouth daily. )  . PARoxetine (PAXIL) 40 MG tablet Take 1 tablet (40 mg total) by mouth daily.  . rivaroxaban (XARELTO) 20 MG TABS tablet Take 20 mg by mouth daily with supper.   No facility-administered encounter medications on file as of 03/24/2020.  :  Review of Systems:  Out of a complete 14 point review of systems, all are reviewed and negative with Julie exception of these symptoms as listed below: Review of Systems  Neurological:       [name] reports that Julie [name] main complaint is pain in Julie [name] legs.       Objective:  Neurological Exam  Physical Exam Physical Examination:   Vitals:   03/24/20 1049  BP: (!) 157/82  Pulse: 91    General Examination: Julie [name] is a very pleasant 69 y.o. female in no acute distress. Julie [name] appears well-developed and well-nourished and well groomed.   HEENT: Normocephalic, atraumatic, pupils are equal, round and reactive to light. Corrective eyeglasses in place. Extraocular tracking is well preserved, hearing grossly intact, face is symmetric with normal facial animation. No carotid bruits, airway examination reveals moderate mouth dryness, adequate dental hygiene. Tongue protrudes centrally and  palate elevates symmetrically.   Chest: Clear to auscultation without wheezing, rhonchi or crackles noted.  Heart: S1+S2+0, regular and normal without murmurs, rubs or gallops noted.   Abdomen: Soft, non-tender and non-distended with normal bowel sounds appreciated on auscultation.  Extremities: There is no pitting edema in Julie distal lower extremities bilaterally.   Skin: Warm and dry without trophic changes noted.  Musculoskeletal: exam reveals R leg pain.   Neurologically:  Mental status: Julie [name] is awake, alert and oriented in all 4 spheres. Julie [name] immediate and remote memory, attention, language skills and fund of knowledge are mildly impaired, Julie [name] has difficulty giving details and does not recall all Julie [name] medications exactly. There is no evidence of aphasia, agnosia, apraxia or anomia. Speech is clear with normal prosody and enunciation. Thought process is linear. Mood is normal and affect is normal.   On 09/11/2019: MMSE: 30/30, CDT: 4/4, AFT: 8/min.  On 03/24/2020: MMSE: 27/30 (Julie [name] lost 1 point on orientation, one-point on figure drawing, one-point on comprehension by not taking Julie paper into Julie [name] right hand).   Cranial nerves II - XII are as described above under HEENT exam. In addition: shoulder shrug is normal with equal shoulder height noted. Motor exam: Normal bulk, strength and tone is noted. There is no drift, tremor or rebound. Reflexes are 1+ throughout. Babinski: Toes are flexor bilaterally. Fine motor skills and coordination: intact with normal finger taps, normal hand movements, normal rapid alternating patting, normal foot taps and normal foot agility.  Cerebellar testing: No dysmetria or intention tremor on finger to nose testing. Heel to shin is unremarkable bilaterally. There is no truncal or gait ataxia.  Sensory exam: intact to light touch in Julie upper and lower extremities.  Gait, station and balance: Julie [name] stands with mild difficulty, Julie [name] walks without a  walking aids, with a slight limp.   Assessment and plan:   In summary, IDONNA HEEREN is a very pleasant 69 year old female  with an underlying medical history of anxiety, depression, migraine headaches, hypertension, kidney stones, osteoporosis, degenerative joint disease, reflux disease, type 2 diabetes, history of peptic ulcer disease with remote history of bleeding gastric ulcer, and left leg DVT recently, who presents for follow-up consultation of Julie [name] memory loss.  Julie [name] developed confusion and altered mental status when Julie [name] was hospitalized earlier in Julie year, deemed acute encephalopathy in Julie context of acute illness.   Julie [name] had significant dehydration, anemia, acute DVT, poor nutrition.  Julie [name] does report a family history of Alzheimer's dementia.  Julie [name] memory function and confusion improved after Julie [name] was hospitalized in with better hydration and nutrition and treatment in Julie hospital Julie [name] clinically improved.  Today, Julie [name] memory scores are slightly worse than when we first met in April 2021.  We will continue to monitor.  Julie [name] had significant blood work and work-up in Julie form of scans when Julie [name] was hospitalized.  Julie [name] does have chronic vascular changes on Julie [name] brain MRI, no significant atrophy.  Of note, Julie [name] admits that Julie [name] does not hydrate very well currently.  Julie [name] is advised to increase Julie [name] water intake to about 6 to 8 cups/day on average, Julie [name] reports an average of 3 cups/day currently.  Julie [name] is on narcotic pain medication but limits herself to typically 1 pill/day.  I do not believe Julie [name] needs any additional work-up from my end of things.  Julie [name] is advised to follow-up routinely in this office in about 6 months, sooner if needed.  Julie [name] is advised to see one of our nurse practitioners.  I answered all Julie [name] questions today and Julie [name] was in agreement. I spent 30 minutes in total face-to-face time and in reviewing records during pre-charting, more than 50% of which was spent in counseling and coordination of care,  reviewing test results, reviewing medications and treatment regimen and/or in discussing or reviewing Julie diagnosis of memory loss, Julie prognosis and treatment options. Pertinent laboratory and imaging test results that were available during this visit with Julie [name] were reviewed by me and considered in my medical decision making (see chart for details).

## 2020-03-24 NOTE — Patient Instructions (Addendum)
It was good to see you again today.  We will continue to monitor your memory scores.  I do not suggest any new medications from my end of things.  Please continue to work on improving your diabetes control. Please try to hydrate better with water, 6 to 8 cups of water per day are generally recommended, 8 ounce size each.  Please try to work on strengthening exercises for your legs.  Follow-up with your primary care physician as scheduled next month.  Please follow-up with one of our nurse practitioners in 6 months.

## 2020-06-04 ENCOUNTER — Other Ambulatory Visit: Payer: Medicare Other

## 2020-09-23 ENCOUNTER — Encounter: Payer: Self-pay | Admitting: Adult Health

## 2020-09-23 ENCOUNTER — Ambulatory Visit: Payer: Medicare Other | Admitting: Adult Health

## 2020-09-28 ENCOUNTER — Telehealth: Payer: Self-pay | Admitting: Physician Assistant

## 2020-09-28 NOTE — Telephone Encounter (Signed)
Julie [name] has been scheduled to see [name] on 5/16 at 2pm. Pt aware to arrive 15 minutes early.

## 2020-10-04 ENCOUNTER — Other Ambulatory Visit: Payer: Self-pay

## 2020-10-04 ENCOUNTER — Inpatient Hospital Stay: Payer: Medicare Other | Attending: Physician Assistant | Admitting: Physician Assistant

## 2020-10-04 ENCOUNTER — Inpatient Hospital Stay: Payer: Medicare Other

## 2020-10-04 ENCOUNTER — Encounter: Payer: Self-pay | Admitting: Physician Assistant

## 2020-10-04 VITALS — BP 163/95 | HR 92 | Temp 96.5°F | Resp 13 | Ht 66.0 in | Wt 148.2 lb

## 2020-10-04 DIAGNOSIS — Z7984 Long term (current) use of oral hypoglycemic drugs: Secondary | ICD-10-CM | POA: Diagnosis not present

## 2020-10-04 DIAGNOSIS — E785 Hyperlipidemia, unspecified: Secondary | ICD-10-CM | POA: Diagnosis not present

## 2020-10-04 DIAGNOSIS — M79605 Pain in left leg: Secondary | ICD-10-CM | POA: Diagnosis not present

## 2020-10-04 DIAGNOSIS — K219 Gastro-esophageal reflux disease without esophagitis: Secondary | ICD-10-CM | POA: Diagnosis not present

## 2020-10-04 DIAGNOSIS — F419 Anxiety disorder, unspecified: Secondary | ICD-10-CM | POA: Diagnosis not present

## 2020-10-04 DIAGNOSIS — I129 Hypertensive chronic kidney disease with stage 1 through stage 4 chronic kidney disease, or unspecified chronic kidney disease: Secondary | ICD-10-CM | POA: Diagnosis not present

## 2020-10-04 DIAGNOSIS — N183 Chronic kidney disease, stage 3 unspecified: Secondary | ICD-10-CM | POA: Insufficient documentation

## 2020-10-04 DIAGNOSIS — M79604 Pain in right leg: Secondary | ICD-10-CM | POA: Insufficient documentation

## 2020-10-04 DIAGNOSIS — F32A Depression, unspecified: Secondary | ICD-10-CM | POA: Insufficient documentation

## 2020-10-04 DIAGNOSIS — E1122 Type 2 diabetes mellitus with diabetic chronic kidney disease: Secondary | ICD-10-CM | POA: Insufficient documentation

## 2020-10-04 DIAGNOSIS — Z7901 Long term (current) use of anticoagulants: Secondary | ICD-10-CM | POA: Diagnosis not present

## 2020-10-04 DIAGNOSIS — Z87891 Personal history of nicotine dependence: Secondary | ICD-10-CM | POA: Insufficient documentation

## 2020-10-04 DIAGNOSIS — E1140 Type 2 diabetes mellitus with diabetic neuropathy, unspecified: Secondary | ICD-10-CM | POA: Diagnosis not present

## 2020-10-04 DIAGNOSIS — Z79899 Other long term (current) drug therapy: Secondary | ICD-10-CM | POA: Insufficient documentation

## 2020-10-04 DIAGNOSIS — Z86718 Personal history of other venous thrombosis and embolism: Secondary | ICD-10-CM | POA: Insufficient documentation

## 2020-10-04 LAB — CBC WITH DIFFERENTIAL (CANCER CENTER ONLY)
Abs Immature Granulocytes: 0.03 10*3/uL (ref 0.00–0.07)
Basophils Absolute: 0 10*3/uL (ref 0.0–0.1)
Basophils Relative: 1 %
Eosinophils Absolute: 0.2 10*3/uL (ref 0.0–0.5)
Eosinophils Relative: 4 %
HCT: 37.2 % (ref 36.0–46.0)
Hemoglobin: 11.9 g/dL — ABNORMAL LOW (ref 12.0–15.0)
Immature Granulocytes: 1 %
Lymphocytes Relative: 20 %
Lymphs Abs: 1.3 10*3/uL (ref 0.7–4.0)
MCH: 30 pg (ref 26.0–34.0)
MCHC: 32 g/dL (ref 30.0–36.0)
MCV: 93.7 fL (ref 80.0–100.0)
Monocytes Absolute: 0.4 10*3/uL (ref 0.1–1.0)
Monocytes Relative: 6 %
Neutro Abs: 4.6 10*3/uL (ref 1.7–7.7)
Neutrophils Relative %: 68 %
Platelet Count: 198 10*3/uL (ref 150–400)
RBC: 3.97 MIL/uL (ref 3.87–5.11)
RDW: 13.4 % (ref 11.5–15.5)
WBC Count: 6.6 10*3/uL (ref 4.0–10.5)
nRBC: 0 % (ref 0.0–0.2)

## 2020-10-04 LAB — CMP (CANCER CENTER ONLY)
ALT: 22 U/L (ref 0–44)
AST: 13 U/L — ABNORMAL LOW (ref 15–41)
Albumin: 3.7 g/dL (ref 3.5–5.0)
Alkaline Phosphatase: 90 U/L (ref 38–126)
Anion gap: 7 (ref 5–15)
BUN: 20 mg/dL (ref 8–23)
CO2: 26 mmol/L (ref 22–32)
Calcium: 10.2 mg/dL (ref 8.9–10.3)
Chloride: 107 mmol/L (ref 98–111)
Creatinine: 0.79 mg/dL (ref 0.44–1.00)
GFR, Estimated: >60 mL/min (ref >60)
Glucose, Bld: 298 mg/dL — ABNORMAL HIGH (ref 70–99)
Potassium: 4.4 mmol/L (ref 3.5–5.1)
Sodium: 140 mmol/L (ref 135–145)
Total Bilirubin: 0.3 mg/dL (ref 0.3–1.2)
Total Protein: 6.6 g/dL (ref 6.5–8.1)

## 2020-10-04 NOTE — Progress Notes (Signed)
[place] [phone]   Fax:([phone]) [place] NOTE  Patient Care Team: Levy Pupa, MD as PCP - General (Family Medicine)  Hematological/Oncological History 1) 07/01/2019: Doppler US revealed acute DVT involving the right gastrocnemius veins. Patient was started on IV heparin and transitioned to Eliquis.   2) 07/08/2019: Doppler US reveled acute DVT involving right soleal veins (same thrombus from prior that was unchanged). Acute DVT involving the left peroneal veins and left soleal veins.   3) 09/03/2019: Doppler US revealed acute DVT involving the right soleal veins and left peroneal veins. No significant change from prior study.   4) Switched from Eliquis to Xarelto due to cost.   5) 10/04/2020: Establish care with Dede Query PA-C  CHIEF COMPLAINTS/PURPOSE OF CONSULTATION:  "Bilateral lower extremity DVTs  "  HISTORY OF PRESENTING ILLNESS:  Julie [name] 70 y.o. female with medical history significant for hypertension, T2DM, diabetic neuropathy, hyperlipidemia, depression, CKD Stage 3 and DVTs. Patient is accompanied by her daughter for this visit.   On review of the previous records, patient presented on 07/01/2019 with right lower extremity pain.  Doppler ultrasound revealed acute DVT involving the right gastrocnemius veins.  Patient started on IV heparin and then discharged on Eliquis.  Patient returned to the emergency room on 07/08/2019 with bilateral lower extremity pain.  Repeat Doppler ultrasound revealed acute DVT involving the right soleal veins (Thrombus unchanged. Previously, it was named the gastrocnemius vein, however it appears to have been in the soleal vein) and involving the left peroneal veins, and left soleal veins.  It was felt that patient had unchanged clot burden and was recommended to continue on Eliquis.  At some point, patient notes switch from Eliquis to Xarelto due to high cost of medication.  On exam today, [name] reports fair energy levels that have not changed recently. She is able to complete basic activities on her own including self care. She needs assistance to do chores. Patient requires a walker for ambulation. After a fall and right hip/femur fracture in November 2021, patient undergoes physical therapy weekly. Patient's appetite is fair but stable. She denies any nausea, vomiting or abdominal pain. Patient continues to have bilateral lower extremity pain that affects her ambulation. Patient reports that she was evaluated by general surgeon who felt that pain was secondary to diabetic neuropathy. She current takes a minimum of 12 extra strength tylenol tablets per day for pain control. She denies any bowel habit changes. Patient denies easy bruising or signs of bleeding. She denies any fevers, chills, night sweats, shortness of breath, chest pain or cough. She has no other complaints. Rest of the 10 point ROS is below.   MEDICAL HISTORY:  Past Medical History:  Diagnosis Date  . Anxiety and depression   . DJD (degenerative joint disease)   . GERD (gastroesophageal reflux disease)   . History of gastric ulcer    w/  bleeding-  05/ 2015  . History of kidney stones   . Hyperlipidemia   . Hypertension 2011  . Migraines   . Nephrolithiasis    bilateral  . Right ureteral stone   . Type 2 diabetes mellitus ([place])     SURGICAL HISTORY: Past Surgical History:  Procedure Laterality Date  . BIOPSY  07/04/2019   Procedure: BIOPSY;  Surgeon: Arnulfo Batson Shipper, MD;  Location: [facility];  Service: Gastroenterology;;  . Sharlett Iles  . ESOPHAGOGASTRODUODENOSCOPY N/A 10/01/2013   Procedure: ESOPHAGOGASTRODUODENOSCOPY (EGD);  Surgeon: Quillian Quince  Merrily Brittle, MD;  Location: [facility];  Service: Endoscopy;  Laterality: N/A;  . ESOPHAGOGASTRODUODENOSCOPY (EGD) WITH PROPOFOL N/A 07/04/2019   Procedure: ESOPHAGOGASTRODUODENOSCOPY (EGD) WITH PROPOFOL;  Surgeon:  Shipper, MD;  Location: [facility];   Service: Gastroenterology;  Laterality: N/A;  . WISDOM TOOTH EXTRACTION      SOCIAL HISTORY: Social History   Socioeconomic History  . Marital status: Married    Spouse name: marielouise amey  . Number of children: 1  . Years of education: Not on file  . Highest education level: Not on file  Occupational History  . Occupation: not working   Tobacco Use  . Smoking status: Former Smoker    Packs/day: 0.50    Years: 4.00    Pack years: 2.00    Types: Cigarettes    Quit date: 05/23/1979    Years since quitting: 41.3  . Smokeless tobacco: Never Used  Substance and Sexual Activity  . Alcohol use: No    Comment: occassional  . Drug use: No  . Sexual activity: Never  Other Topics Concern  . Not on file  Social History Narrative   Husband had a stroke 4 years ago.  Daughter moved in to help them out.   Social Determinants of Health   Financial Resource Strain: Not on file  Food Insecurity: Not on file  Transportation Needs: Not on file  Physical Activity: Not on file  Stress: Not on file  Social Connections: Not on file  Intimate Partner Violence: Not on file    FAMILY HISTORY: Family History  Problem Relation Age of Onset  . Cancer Mother        ovary/uterus cancer  . Hyperlipidemia Mother        M and F  . Heart disease Mother        M had CHF  . Hypertension Mother        Jerilynn Mages and F  . Diabetes Father   . Mental illness Maternal Grandmother   . Hypertension Maternal Grandmother   . Breast cancer Maternal Grandmother   . Mental illness Maternal Grandfather   . Hypertension Maternal Grandfather   . Stomach cancer Maternal Grandfather   . Mental illness Paternal Grandmother   . Hypertension Paternal Grandmother   . Breast cancer Paternal Grandmother   . Mental illness Paternal Grandfather   . Hypertension Paternal Grandfather   . Colon cancer Neg Hx     ALLERGIES:  has No Known Allergies.  MEDICATIONS:  Current Outpatient Medications  Medication Sig Dispense  Refill  . acetaminophen (TYLENOL) 500 MG tablet Take 500 mg by mouth every 6 (six) hours as needed for moderate pain or headache.     . ALPRAZolam (XANAX) 0.25 MG tablet Take 2 tablets (0.5 mg total) by mouth at bedtime as needed for anxiety. 60 tablet 0  . atorvastatin (LIPITOR) 10 MG tablet Take 1 tablet (10 mg total) by mouth daily. 30 tablet 6  . diltiazem (CARTIA XT) 240 MG 24 hr capsule Take 1 capsule (240 mg total) by mouth daily. 90 capsule 1  . fenofibrate micronized (LOFIBRA) 200 MG capsule Take 1 capsule (200 mg total) by mouth daily before breakfast. 90 capsule 1  . ferrous sulfate 325 (65 FE) MG tablet Take 1 tablet (325 mg total) by mouth 2 (two) times daily with a meal. 60 tablet 3  . glimepiride (AMARYL) 4 MG tablet Take 1.5 tablets (6 mg total) by mouth daily with breakfast. 45 tablet 0  . insulin NPH  Human (NOVOLIN N) 100 UNIT/ML injection Inject into the skin. 10 units AM, 20 units PM    . losartan (COZAAR) 50 MG tablet Take 25 mg by mouth daily.     . magnesium oxide (MAG-OX) 400 (241.3 Mg) MG tablet Take 1 tablet (400 mg total) by mouth 2 (two) times daily. 30 tablet 0  . metFORMIN (GLUCOPHAGE) 1000 MG tablet Take 1 tablet (1,000 mg total) by mouth 2 (two) times daily with a meal. 60 tablet 0  . pantoprazole (PROTONIX) 40 MG tablet 40 mg twice daily for one month followed by 40 mg daily indefinitely. (Patient taking differently: Take 40 mg by mouth daily. ) 60 tablet 1  . PARoxetine (PAXIL) 40 MG tablet Take 1 tablet (40 mg total) by mouth daily. 90 tablet 1   No current facility-administered medications for this visit.    REVIEW OF SYSTEMS:   Constitutional: ( - ) fevers, ( - )  chills , ( - ) night sweats Eyes: ( - ) blurriness of vision, ( - ) double vision, ( - ) watery eyes Ears, nose, mouth, throat, and face: ( - ) mucositis, ( - ) sore throat Respiratory: ( - ) cough, ( - ) dyspnea, ( - ) wheezes Cardiovascular: ( - ) palpitation, ( - ) chest discomfort, ( - )  lower extremity swelling Gastrointestinal:  ( - ) nausea, ( - ) heartburn, ( - ) change in bowel habits Skin: ( - ) abnormal skin rashes Lymphatics: ( - ) new lymphadenopathy, ( - ) easy bruising Neurological: ( - ) numbness, ( - ) tingling, ( - ) new weaknesses Behavioral/Psych: ( - ) mood change, ( - ) new changes  All other systems were reviewed with the patient and are negative.  PHYSICAL EXAMINATION: ECOG PERFORMANCE STATUS: 2 - Symptomatic, <50% confined to bed  Vitals:   10/04/20 1410  BP: (!) 163/95  Pulse: 92  Resp: 13  Temp: (!) 96.5 F (35.8 C)  SpO2: 99%   Filed Weights   10/04/20 1410  Weight: 148 lb 3.2 oz (67.2 kg)    GENERAL: well appearing female in NAD  SKIN: skin color, texture, turgor are normal, no rashes or significant lesions EYES: conjunctiva are pink and non-injected, sclera clear OROPHARYNX: no exudate, no erythema; lips, buccal mucosa, and tongue normal  NECK: supple, non-tender LYMPH:  no palpable lymphadenopathy in the cervical, axillary or supraclavicular lymph nodes.  LUNGS: clear to auscultation and percussion with normal breathing effort HEART: regular rate & rhythm and no murmurs and no lower extremity edema ABDOMEN: soft, non-tender, non-distended, normal bowel sounds Musculoskeletal: no cyanosis of digits and no clubbing  PSYCH: alert & oriented x 3, fluent speech NEURO: no focal motor/sensory deficits  LABORATORY DATA:  I have reviewed the data as listed CBC Latest Ref Rng & Units 10/04/2020 07/15/2019 07/15/2019  WBC 4.0 - 10.5 K/uL 6.6 9.2 7.9  Hemoglobin 12.0 - 15.0 g/dL 11.9(L) 10.8(L) 10.7(L)  Hematocrit 36.0 - 46.0 % 37.2 39.7 38.3  Platelets 150 - 400 K/uL 198 323 347    CMP Latest Ref Rng & Units 10/04/2020 07/15/2019 07/15/2019  Glucose 70 - 99 mg/dL 298(H) 244(H) 204(H)  BUN 8 - 23 mg/dL 20 18 12   Creatinine 0.44 - 1.00 mg/dL 0.79 1.17(H) 0.90  Sodium 135 - 145 mmol/L 140 144 143  Potassium 3.5 - 5.1 mmol/L 4.4 3.2(L) 3.5   Chloride 98 - 111 mmol/L 107 112(H) 110  CO2 22 - 32 mmol/L 26 20(L)  20(L)  Calcium 8.9 - 10.3 mg/dL 10.2 9.8 10.0  Total Protein 6.5 - 8.1 g/dL 6.6 6.7 -  Total Bilirubin 0.3 - 1.2 mg/dL 0.3 0.8 -  Alkaline Phos 38 - 126 U/L 90 66 -  AST 15 - 41 U/L 13(L) 26 -  ALT 0 - 44 U/L 22 21 -    ASSESSMENT & PLAN Julie [name] is a 70 y.o. female presenting to the clinic for evaluation for history of bilateral lower extremity DVTs. Patient notes that prior to diagnosis of bilateral DVTs, she was sedentary due to COVID pandemic without ambulating for at least 3-4 hours. This is the likely provoking cause of her DVTs. Patient discontinued Xarelto approximately one month and I think it is reasonable to hold additional anticoagulation as patient notes to be more active. I explained that with prior history of DVTs, patient is at increased risk of developing another VTE. I educated patient on methods to decrease risk for repeat VTE including ambulating every 2 hours while on long car/plane rides and perioperative anticoagulation with surgeries.   #History of bilateral lower extremity DVTs: --Likely provoking factor was immobility due to sedentary lifestyle during COVID pandemic.  --Patient was most recently on Xarelto until one month ago as patient was instructed by PCP to discontinue.  --Reasonable to hold anticoagulation and monitor as patient notes to be more active.  --RTC as needed or if patient develops recurrent VTE.   #Tylenol intake: --Patient reports taking at least 12 extra strength Tylenol tablets per day for lower extremity pain. Advised that maximum dose for Tylenol should be no more than 3000 mg per day.  --Pain is likely secondary to diabetic peripheral neuropathy.  --Sent urgent request for pain clinic --Labs today to check liver functions with CBC and CMP.    Orders Placed This Encounter  Procedures  . CBC with Differential (Cancer Center Only)    Standing Status:   Future     Number of Occurrences:   1    Standing Expiration Date:   10/04/2021  . CMP ([place] only)    Standing Status:   Future    Number of Occurrences:   1    Standing Expiration Date:   10/04/2021  . Ambulatory referral to Pain Clinic    Referral Priority:   Urgent    Referral Type:   Consultation    Referral Reason:   Specialty Services Required    Requested Specialty:   Pain Medicine    Number of Visits Requested:   1    All questions were answered. The patient knows to call the clinic with any problems, questions or concerns.  I have spent a total of 60 minutes minutes of face-to-face and non-face-to-face time, preparing to see the patient, obtaining and/or reviewing separately obtained history, performing a medically appropriate examination, counseling and educating the patient, ordering tests, referring with other health care professionals, documenting clinical information in the electronic health record and communicating results to the patient, and care coordination.   Dede Query, PA-C Department of Hematology/Oncology [place] at [facility] Phone: [phone]  Patient was seen with Dr. Lorenso Courier.   I have read the above note and personally examined the patient. I agree with the assessment and plan as noted above.  Briefly Mrs. [name] is a 70 year old female with medical history significant for provoked bilateral DVT.  Patient was referred today for consideration of anticoagulation management.  She has completed a full course of  anticoagulation therapy.  Given the provoked nature of this DVT would recommend no further anticoagulation therapy.  We do recommend strict precautions for prolonged immobility such as air travel or long car ride.  Strongly encouraged the patient to avoid long periods of time of immobility.  Encourage exercise, compression socks, and baby aspirin if necessary.  The patient voiced understanding of this plan moving forward.   Ledell Peoples, MD Department of Hematology/Oncology [place] at [facility] Phone: [phone] Pager: [phone] Email: [email or web address]

## 2020-10-05 DIAGNOSIS — Z86718 Personal history of other venous thrombosis and embolism: Secondary | ICD-10-CM | POA: Insufficient documentation

## 2020-10-12 ENCOUNTER — Telehealth: Payer: Self-pay | Admitting: Physician Assistant

## 2020-10-12 NOTE — Telephone Encounter (Signed)
I called patient's daughter, Rosetta Rupnow, after I was unable to reach the patient. I reviewed the labs from 10/04/2020 after patient mentioned excessive Tylenol intake for pain control. I reviewed there was no evidence of liver dysfunction based on labs but advised to limit Tylenol intake to 3000 mg per day. I confirmed that an urgent referral to pain clinic has been made.   In addition, patient's glucose level was 298 which patient's daughter says is normal for the patient. I advised her to follow up with PCP to provide additional recommendations.   Patient's daughter expressed understanding and satisfaction with the plan provided.

## 2021-01-13 ENCOUNTER — Encounter: Payer: Self-pay | Admitting: *Deleted

## 2021-01-17 ENCOUNTER — Encounter: Payer: Self-pay | Admitting: Neurology

## 2021-01-17 ENCOUNTER — Institutional Professional Consult (permissible substitution): Payer: Medicare Other | Admitting: Neurology

## 2021-02-19 DEATH — deceased
